# Patient Record
Sex: Male | Born: 1945 | Race: White | Hispanic: No | Marital: Married | Smoking: Never smoker
Health system: Southern US, Community
[De-identification: ages and names within clinical notes are randomized; demographics above are authoritative.]

## PROBLEM LIST (undated history)

## (undated) DIAGNOSIS — I350 Nonrheumatic aortic (valve) stenosis: Secondary | ICD-10-CM

## (undated) DIAGNOSIS — U071 COVID-19: Secondary | ICD-10-CM

## (undated) DIAGNOSIS — R011 Cardiac murmur, unspecified: Secondary | ICD-10-CM

## (undated) DIAGNOSIS — E119 Type 2 diabetes mellitus without complications: Secondary | ICD-10-CM

## (undated) DIAGNOSIS — I10 Essential (primary) hypertension: Secondary | ICD-10-CM

## (undated) HISTORY — DX: Essential (primary) hypertension: I10

## (undated) HISTORY — DX: Type 2 diabetes mellitus without complications: E11.9

---

## 2011-01-05 ENCOUNTER — Other Ambulatory Visit: Payer: Self-pay | Admitting: Gastroenterology

## 2011-01-13 ENCOUNTER — Ambulatory Visit
Admission: RE | Admit: 2011-01-13 | Discharge: 2011-01-13 | Disposition: A | Discharge status: Home / Self Care | Payer: BC Managed Care – PPO | Source: Ambulatory Visit | Attending: Gastroenterology | Admitting: Gastroenterology

## 2013-01-28 DIAGNOSIS — Z125 Encounter for screening for malignant neoplasm of prostate: Secondary | ICD-10-CM | POA: Diagnosis not present

## 2013-01-28 DIAGNOSIS — H519 Unspecified disorder of binocular movement: Secondary | ICD-10-CM | POA: Diagnosis not present

## 2013-01-28 DIAGNOSIS — Z0000 Encounter for general adult medical examination without abnormal findings: Secondary | ICD-10-CM | POA: Diagnosis not present

## 2013-01-28 DIAGNOSIS — I10 Essential (primary) hypertension: Secondary | ICD-10-CM | POA: Diagnosis not present

## 2013-01-28 DIAGNOSIS — E663 Overweight: Secondary | ICD-10-CM | POA: Diagnosis not present

## 2013-02-04 DIAGNOSIS — R7309 Other abnormal glucose: Secondary | ICD-10-CM | POA: Diagnosis not present

## 2013-04-08 DIAGNOSIS — IMO0002 Reserved for concepts with insufficient information to code with codable children: Secondary | ICD-10-CM | POA: Diagnosis not present

## 2013-04-08 DIAGNOSIS — H5210 Myopia, unspecified eye: Secondary | ICD-10-CM | POA: Diagnosis not present

## 2014-02-03 DIAGNOSIS — K521 Toxic gastroenteritis and colitis: Secondary | ICD-10-CM | POA: Diagnosis not present

## 2014-02-03 DIAGNOSIS — I10 Essential (primary) hypertension: Secondary | ICD-10-CM | POA: Diagnosis not present

## 2014-02-03 DIAGNOSIS — Z125 Encounter for screening for malignant neoplasm of prostate: Secondary | ICD-10-CM | POA: Diagnosis not present

## 2014-02-03 DIAGNOSIS — E663 Overweight: Secondary | ICD-10-CM | POA: Diagnosis not present

## 2014-02-03 DIAGNOSIS — Z0001 Encounter for general adult medical examination with abnormal findings: Secondary | ICD-10-CM | POA: Diagnosis not present

## 2014-02-03 DIAGNOSIS — Z1389 Encounter for screening for other disorder: Secondary | ICD-10-CM | POA: Diagnosis not present

## 2014-02-03 DIAGNOSIS — Z23 Encounter for immunization: Secondary | ICD-10-CM | POA: Diagnosis not present

## 2014-02-03 DIAGNOSIS — E119 Type 2 diabetes mellitus without complications: Secondary | ICD-10-CM | POA: Diagnosis not present

## 2014-02-24 ENCOUNTER — Encounter: Payer: Medicare Other | Attending: Internal Medicine

## 2014-02-24 VITALS — Ht 69.0 in | Wt 232.2 lb

## 2014-02-24 DIAGNOSIS — E119 Type 2 diabetes mellitus without complications: Secondary | ICD-10-CM | POA: Insufficient documentation

## 2014-02-24 DIAGNOSIS — Z713 Dietary counseling and surveillance: Secondary | ICD-10-CM | POA: Insufficient documentation

## 2014-02-26 DIAGNOSIS — E119 Type 2 diabetes mellitus without complications: Secondary | ICD-10-CM | POA: Diagnosis not present

## 2014-02-26 DIAGNOSIS — Z713 Dietary counseling and surveillance: Secondary | ICD-10-CM | POA: Diagnosis not present

## 2014-02-26 NOTE — Progress Notes (Signed)

## 2014-02-27 NOTE — Progress Notes (Signed)
Patient was seen on 02/24/14 for the first of a series of three diabetes self-management courses at the [facility].  Patient Education Plan per assessed needs and concerns is to attend four course education program for Diabetes Self Management Education.  The following learning objectives were met by the patient during this class:  Describe diabetes  State some common risk factors for diabetes  Defines the role of glucose and insulin  Identifies type of diabetes and pathophysiology  Describe the relationship between diabetes and cardiovascular risk  State the members of the Healthcare Team  States the rationale for glucose monitoring  State when to test glucose  State their individual Target Range  State the importance of logging glucose readings  Describe how to interpret glucose readings  Identifies A1C target  Explain the correlation between A1c and eAG values  State symptoms and treatment of high blood glucose  State symptoms and treatment of low blood glucose  Explain proper technique for glucose testing  Identifies proper sharps disposal  Handouts given during class include:  Living Well with Diabetes book  Carb Counting and Meal Planning book  Meal Plan Card  Carbohydrate guide  Meal planning worksheet  Low Sodium Flavoring Tips  The diabetes portion plate  F8M to eAG Conversion Chart  Diabetes Medications  Diabetes Recommended Care Schedule  Support Group  Diabetes Success Plan  Core Class Satisfaction Survey  Follow-Up Plan:  Attend core 2

## 2014-03-03 ENCOUNTER — Ambulatory Visit: Payer: BC Managed Care – PPO

## 2014-03-10 DIAGNOSIS — E119 Type 2 diabetes mellitus without complications: Secondary | ICD-10-CM

## 2014-03-10 DIAGNOSIS — Z713 Dietary counseling and surveillance: Secondary | ICD-10-CM | POA: Diagnosis not present

## 2014-03-13 NOTE — Progress Notes (Signed)
Patient was seen on 03/10/14 for the third of a series of three diabetes self-management courses at the [facility]. The following learning objectives were met by the patient during this class:  . State the amount of activity recommended for healthy living . Describe activities suitable for individual needs . Identify ways to regularly incorporate activity into daily life . Identify barriers to activity and ways to over come these barriers  Identify diabetes medications being personally used and their primary action for lowering glucose and possible side effects . Describe role of stress on blood glucose and develop strategies to address psychosocial issues . Identify diabetes complications and ways to prevent them  Explain how to manage diabetes during illness . Evaluate success in meeting personal goal . Establish 2-3 goals that they will plan to diligently work on until they return for the  75-monthfollow-up visit  Goals:   I will count my carb choices at most meals and snacks  Your patient has identified these potential barriers to change:  None states  Your patient has identified their diabetes self-care support plan as  Family Support Plan:  Attend Core 4 in 4 months

## 2014-04-30 DIAGNOSIS — I10 Essential (primary) hypertension: Secondary | ICD-10-CM | POA: Diagnosis not present

## 2014-04-30 DIAGNOSIS — E119 Type 2 diabetes mellitus without complications: Secondary | ICD-10-CM | POA: Diagnosis not present

## 2014-06-29 ENCOUNTER — Ambulatory Visit: Payer: BC Managed Care – PPO

## 2014-08-26 DIAGNOSIS — E119 Type 2 diabetes mellitus without complications: Secondary | ICD-10-CM | POA: Diagnosis not present

## 2014-08-26 DIAGNOSIS — E785 Hyperlipidemia, unspecified: Secondary | ICD-10-CM | POA: Diagnosis not present

## 2014-08-26 DIAGNOSIS — E663 Overweight: Secondary | ICD-10-CM | POA: Diagnosis not present

## 2014-08-26 DIAGNOSIS — I10 Essential (primary) hypertension: Secondary | ICD-10-CM | POA: Diagnosis not present

## 2014-08-26 DIAGNOSIS — Z6835 Body mass index (BMI) 35.0-35.9, adult: Secondary | ICD-10-CM | POA: Diagnosis not present

## 2014-11-17 DIAGNOSIS — H4423 Degenerative myopia, bilateral: Secondary | ICD-10-CM | POA: Diagnosis not present

## 2014-11-17 DIAGNOSIS — H5021 Vertical strabismus, right eye: Secondary | ICD-10-CM | POA: Diagnosis not present

## 2014-12-14 DIAGNOSIS — L723 Sebaceous cyst: Secondary | ICD-10-CM | POA: Diagnosis not present

## 2014-12-14 DIAGNOSIS — L821 Other seborrheic keratosis: Secondary | ICD-10-CM | POA: Diagnosis not present

## 2015-03-17 DIAGNOSIS — Z125 Encounter for screening for malignant neoplasm of prostate: Secondary | ICD-10-CM | POA: Diagnosis not present

## 2015-03-17 DIAGNOSIS — Z0000 Encounter for general adult medical examination without abnormal findings: Secondary | ICD-10-CM | POA: Diagnosis not present

## 2015-03-17 DIAGNOSIS — E663 Overweight: Secondary | ICD-10-CM | POA: Diagnosis not present

## 2015-03-17 DIAGNOSIS — Z1389 Encounter for screening for other disorder: Secondary | ICD-10-CM | POA: Diagnosis not present

## 2015-03-17 DIAGNOSIS — E119 Type 2 diabetes mellitus without complications: Secondary | ICD-10-CM | POA: Diagnosis not present

## 2015-03-17 DIAGNOSIS — E785 Hyperlipidemia, unspecified: Secondary | ICD-10-CM | POA: Diagnosis not present

## 2015-03-17 DIAGNOSIS — I10 Essential (primary) hypertension: Secondary | ICD-10-CM | POA: Diagnosis not present

## 2015-03-17 DIAGNOSIS — Z6835 Body mass index (BMI) 35.0-35.9, adult: Secondary | ICD-10-CM | POA: Diagnosis not present

## 2015-10-15 DIAGNOSIS — I10 Essential (primary) hypertension: Secondary | ICD-10-CM | POA: Diagnosis not present

## 2015-10-15 DIAGNOSIS — E7800 Pure hypercholesterolemia, unspecified: Secondary | ICD-10-CM | POA: Diagnosis not present

## 2015-10-15 DIAGNOSIS — Z7984 Long term (current) use of oral hypoglycemic drugs: Secondary | ICD-10-CM | POA: Diagnosis not present

## 2015-10-15 DIAGNOSIS — E119 Type 2 diabetes mellitus without complications: Secondary | ICD-10-CM | POA: Diagnosis not present

## 2015-10-15 DIAGNOSIS — E663 Overweight: Secondary | ICD-10-CM | POA: Diagnosis not present

## 2015-10-15 DIAGNOSIS — Z6834 Body mass index (BMI) 34.0-34.9, adult: Secondary | ICD-10-CM | POA: Diagnosis not present

## 2016-04-03 DIAGNOSIS — Z0000 Encounter for general adult medical examination without abnormal findings: Secondary | ICD-10-CM | POA: Diagnosis not present

## 2016-04-03 DIAGNOSIS — E663 Overweight: Secondary | ICD-10-CM | POA: Diagnosis not present

## 2016-04-03 DIAGNOSIS — Z1389 Encounter for screening for other disorder: Secondary | ICD-10-CM | POA: Diagnosis not present

## 2016-04-03 DIAGNOSIS — E7800 Pure hypercholesterolemia, unspecified: Secondary | ICD-10-CM | POA: Diagnosis not present

## 2016-04-03 DIAGNOSIS — I10 Essential (primary) hypertension: Secondary | ICD-10-CM | POA: Diagnosis not present

## 2016-04-03 DIAGNOSIS — Z6833 Body mass index (BMI) 33.0-33.9, adult: Secondary | ICD-10-CM | POA: Diagnosis not present

## 2016-04-03 DIAGNOSIS — Z125 Encounter for screening for malignant neoplasm of prostate: Secondary | ICD-10-CM | POA: Diagnosis not present

## 2016-04-03 DIAGNOSIS — E119 Type 2 diabetes mellitus without complications: Secondary | ICD-10-CM | POA: Diagnosis not present

## 2016-04-03 DIAGNOSIS — Z7984 Long term (current) use of oral hypoglycemic drugs: Secondary | ICD-10-CM | POA: Diagnosis not present

## 2016-08-14 DIAGNOSIS — R5383 Other fatigue: Secondary | ICD-10-CM | POA: Diagnosis not present

## 2016-08-14 DIAGNOSIS — F419 Anxiety disorder, unspecified: Secondary | ICD-10-CM | POA: Diagnosis not present

## 2016-10-02 DIAGNOSIS — I10 Essential (primary) hypertension: Secondary | ICD-10-CM | POA: Diagnosis not present

## 2016-10-02 DIAGNOSIS — E663 Overweight: Secondary | ICD-10-CM | POA: Diagnosis not present

## 2016-10-02 DIAGNOSIS — E119 Type 2 diabetes mellitus without complications: Secondary | ICD-10-CM | POA: Diagnosis not present

## 2016-10-02 DIAGNOSIS — Z6833 Body mass index (BMI) 33.0-33.9, adult: Secondary | ICD-10-CM | POA: Diagnosis not present

## 2016-10-02 DIAGNOSIS — F419 Anxiety disorder, unspecified: Secondary | ICD-10-CM | POA: Diagnosis not present

## 2016-10-02 DIAGNOSIS — E7800 Pure hypercholesterolemia, unspecified: Secondary | ICD-10-CM | POA: Diagnosis not present

## 2016-11-28 DIAGNOSIS — H5021 Vertical strabismus, right eye: Secondary | ICD-10-CM | POA: Diagnosis not present

## 2017-01-16 DIAGNOSIS — H5021 Vertical strabismus, right eye: Secondary | ICD-10-CM | POA: Diagnosis not present

## 2017-04-04 DIAGNOSIS — Z7984 Long term (current) use of oral hypoglycemic drugs: Secondary | ICD-10-CM | POA: Diagnosis not present

## 2017-04-04 DIAGNOSIS — Z23 Encounter for immunization: Secondary | ICD-10-CM | POA: Diagnosis not present

## 2017-04-04 DIAGNOSIS — F419 Anxiety disorder, unspecified: Secondary | ICD-10-CM | POA: Diagnosis not present

## 2017-04-04 DIAGNOSIS — E7800 Pure hypercholesterolemia, unspecified: Secondary | ICD-10-CM | POA: Diagnosis not present

## 2017-04-04 DIAGNOSIS — Z125 Encounter for screening for malignant neoplasm of prostate: Secondary | ICD-10-CM | POA: Diagnosis not present

## 2017-04-04 DIAGNOSIS — I10 Essential (primary) hypertension: Secondary | ICD-10-CM | POA: Diagnosis not present

## 2017-04-04 DIAGNOSIS — E119 Type 2 diabetes mellitus without complications: Secondary | ICD-10-CM | POA: Diagnosis not present

## 2017-04-04 DIAGNOSIS — Z1389 Encounter for screening for other disorder: Secondary | ICD-10-CM | POA: Diagnosis not present

## 2017-04-04 DIAGNOSIS — E663 Overweight: Secondary | ICD-10-CM | POA: Diagnosis not present

## 2017-04-04 DIAGNOSIS — Z0000 Encounter for general adult medical examination without abnormal findings: Secondary | ICD-10-CM | POA: Diagnosis not present

## 2017-10-03 DIAGNOSIS — E1169 Type 2 diabetes mellitus with other specified complication: Secondary | ICD-10-CM | POA: Diagnosis not present

## 2017-10-03 DIAGNOSIS — F419 Anxiety disorder, unspecified: Secondary | ICD-10-CM | POA: Diagnosis not present

## 2017-10-03 DIAGNOSIS — Z6832 Body mass index (BMI) 32.0-32.9, adult: Secondary | ICD-10-CM | POA: Diagnosis not present

## 2017-10-03 DIAGNOSIS — E663 Overweight: Secondary | ICD-10-CM | POA: Diagnosis not present

## 2017-10-03 DIAGNOSIS — Z7984 Long term (current) use of oral hypoglycemic drugs: Secondary | ICD-10-CM | POA: Diagnosis not present

## 2017-10-03 DIAGNOSIS — E7800 Pure hypercholesterolemia, unspecified: Secondary | ICD-10-CM | POA: Diagnosis not present

## 2017-10-03 DIAGNOSIS — I10 Essential (primary) hypertension: Secondary | ICD-10-CM | POA: Diagnosis not present

## 2018-01-29 DIAGNOSIS — Z800 Family history of malignant neoplasm of digestive organs: Secondary | ICD-10-CM | POA: Diagnosis not present

## 2018-01-29 DIAGNOSIS — K5730 Diverticulosis of large intestine without perforation or abscess without bleeding: Secondary | ICD-10-CM | POA: Diagnosis not present

## 2018-01-29 DIAGNOSIS — Z1211 Encounter for screening for malignant neoplasm of colon: Secondary | ICD-10-CM | POA: Diagnosis not present

## 2018-03-01 DIAGNOSIS — Z800 Family history of malignant neoplasm of digestive organs: Secondary | ICD-10-CM | POA: Diagnosis not present

## 2018-03-01 DIAGNOSIS — D123 Benign neoplasm of transverse colon: Secondary | ICD-10-CM | POA: Diagnosis not present

## 2018-03-01 DIAGNOSIS — Z1211 Encounter for screening for malignant neoplasm of colon: Secondary | ICD-10-CM | POA: Diagnosis not present

## 2018-03-01 DIAGNOSIS — K635 Polyp of colon: Secondary | ICD-10-CM | POA: Diagnosis not present

## 2018-03-01 DIAGNOSIS — K5730 Diverticulosis of large intestine without perforation or abscess without bleeding: Secondary | ICD-10-CM | POA: Diagnosis not present

## 2018-04-30 DIAGNOSIS — Z0000 Encounter for general adult medical examination without abnormal findings: Secondary | ICD-10-CM | POA: Diagnosis not present

## 2018-04-30 DIAGNOSIS — Z125 Encounter for screening for malignant neoplasm of prostate: Secondary | ICD-10-CM | POA: Diagnosis not present

## 2018-04-30 DIAGNOSIS — Z1389 Encounter for screening for other disorder: Secondary | ICD-10-CM | POA: Diagnosis not present

## 2018-04-30 DIAGNOSIS — E7800 Pure hypercholesterolemia, unspecified: Secondary | ICD-10-CM | POA: Diagnosis not present

## 2018-04-30 DIAGNOSIS — I10 Essential (primary) hypertension: Secondary | ICD-10-CM | POA: Diagnosis not present

## 2018-04-30 DIAGNOSIS — E1169 Type 2 diabetes mellitus with other specified complication: Secondary | ICD-10-CM | POA: Diagnosis not present

## 2018-04-30 DIAGNOSIS — E663 Overweight: Secondary | ICD-10-CM | POA: Diagnosis not present

## 2018-11-05 DIAGNOSIS — F419 Anxiety disorder, unspecified: Secondary | ICD-10-CM | POA: Diagnosis not present

## 2018-11-05 DIAGNOSIS — E1169 Type 2 diabetes mellitus with other specified complication: Secondary | ICD-10-CM | POA: Diagnosis not present

## 2018-11-05 DIAGNOSIS — E7800 Pure hypercholesterolemia, unspecified: Secondary | ICD-10-CM | POA: Diagnosis not present

## 2018-11-05 DIAGNOSIS — I10 Essential (primary) hypertension: Secondary | ICD-10-CM | POA: Diagnosis not present

## 2018-11-07 DIAGNOSIS — F419 Anxiety disorder, unspecified: Secondary | ICD-10-CM | POA: Diagnosis not present

## 2018-11-07 DIAGNOSIS — E1169 Type 2 diabetes mellitus with other specified complication: Secondary | ICD-10-CM | POA: Diagnosis not present

## 2018-11-07 DIAGNOSIS — E7800 Pure hypercholesterolemia, unspecified: Secondary | ICD-10-CM | POA: Diagnosis not present

## 2019-05-28 DIAGNOSIS — Z1389 Encounter for screening for other disorder: Secondary | ICD-10-CM | POA: Diagnosis not present

## 2019-05-28 DIAGNOSIS — Z0000 Encounter for general adult medical examination without abnormal findings: Secondary | ICD-10-CM | POA: Diagnosis not present

## 2019-05-28 DIAGNOSIS — E7800 Pure hypercholesterolemia, unspecified: Secondary | ICD-10-CM | POA: Diagnosis not present

## 2019-05-28 DIAGNOSIS — E1169 Type 2 diabetes mellitus with other specified complication: Secondary | ICD-10-CM | POA: Diagnosis not present

## 2019-05-28 DIAGNOSIS — E663 Overweight: Secondary | ICD-10-CM | POA: Diagnosis not present

## 2019-05-28 DIAGNOSIS — I358 Other nonrheumatic aortic valve disorders: Secondary | ICD-10-CM | POA: Diagnosis not present

## 2019-05-28 DIAGNOSIS — I10 Essential (primary) hypertension: Secondary | ICD-10-CM | POA: Diagnosis not present

## 2019-06-11 DIAGNOSIS — I358 Other nonrheumatic aortic valve disorders: Secondary | ICD-10-CM | POA: Diagnosis not present

## 2019-10-20 DIAGNOSIS — G4700 Insomnia, unspecified: Secondary | ICD-10-CM | POA: Diagnosis not present

## 2019-10-20 DIAGNOSIS — F419 Anxiety disorder, unspecified: Secondary | ICD-10-CM | POA: Diagnosis not present

## 2019-10-20 DIAGNOSIS — I350 Nonrheumatic aortic (valve) stenosis: Secondary | ICD-10-CM | POA: Diagnosis not present

## 2019-11-28 DIAGNOSIS — E1169 Type 2 diabetes mellitus with other specified complication: Secondary | ICD-10-CM | POA: Diagnosis not present

## 2019-11-28 DIAGNOSIS — E663 Overweight: Secondary | ICD-10-CM | POA: Diagnosis not present

## 2019-11-28 DIAGNOSIS — I350 Nonrheumatic aortic (valve) stenosis: Secondary | ICD-10-CM | POA: Diagnosis not present

## 2019-11-28 DIAGNOSIS — I10 Essential (primary) hypertension: Secondary | ICD-10-CM | POA: Diagnosis not present

## 2019-11-28 DIAGNOSIS — E7800 Pure hypercholesterolemia, unspecified: Secondary | ICD-10-CM | POA: Diagnosis not present

## 2019-11-28 DIAGNOSIS — Z7984 Long term (current) use of oral hypoglycemic drugs: Secondary | ICD-10-CM | POA: Diagnosis not present

## 2019-11-28 DIAGNOSIS — E871 Hypo-osmolality and hyponatremia: Secondary | ICD-10-CM | POA: Diagnosis not present

## 2019-11-28 DIAGNOSIS — F419 Anxiety disorder, unspecified: Secondary | ICD-10-CM | POA: Diagnosis not present

## 2019-12-02 DIAGNOSIS — E871 Hypo-osmolality and hyponatremia: Secondary | ICD-10-CM | POA: Diagnosis not present

## 2019-12-05 DIAGNOSIS — E785 Hyperlipidemia, unspecified: Secondary | ICD-10-CM | POA: Diagnosis not present

## 2019-12-05 DIAGNOSIS — E119 Type 2 diabetes mellitus without complications: Secondary | ICD-10-CM | POA: Diagnosis not present

## 2019-12-05 DIAGNOSIS — G4700 Insomnia, unspecified: Secondary | ICD-10-CM | POA: Diagnosis not present

## 2019-12-05 DIAGNOSIS — E1169 Type 2 diabetes mellitus with other specified complication: Secondary | ICD-10-CM | POA: Diagnosis not present

## 2019-12-05 DIAGNOSIS — I10 Essential (primary) hypertension: Secondary | ICD-10-CM | POA: Diagnosis not present

## 2019-12-05 DIAGNOSIS — E7800 Pure hypercholesterolemia, unspecified: Secondary | ICD-10-CM | POA: Diagnosis not present

## 2019-12-10 DIAGNOSIS — E871 Hypo-osmolality and hyponatremia: Secondary | ICD-10-CM | POA: Diagnosis not present

## 2020-02-18 DIAGNOSIS — U071 COVID-19: Secondary | ICD-10-CM | POA: Diagnosis not present

## 2020-02-18 DIAGNOSIS — R059 Cough, unspecified: Secondary | ICD-10-CM | POA: Diagnosis not present

## 2020-02-23 ENCOUNTER — Other Ambulatory Visit (HOSPITAL_COMMUNITY): Payer: Self-pay

## 2020-02-23 DIAGNOSIS — U071 COVID-19: Secondary | ICD-10-CM | POA: Diagnosis not present

## 2020-02-23 DIAGNOSIS — Z9189 Other specified personal risk factors, not elsewhere classified: Secondary | ICD-10-CM | POA: Diagnosis not present

## 2020-02-24 ENCOUNTER — Telehealth: Payer: Self-pay | Admitting: Unknown Physician Specialty

## 2020-02-24 ENCOUNTER — Other Ambulatory Visit: Payer: Self-pay | Admitting: Unknown Physician Specialty

## 2020-02-24 ENCOUNTER — Ambulatory Visit (HOSPITAL_COMMUNITY)
Admission: RE | Admit: 2020-02-24 | Discharge: 2020-02-24 | Disposition: A | Discharge status: Home / Self Care | Payer: Medicare Other | Source: Ambulatory Visit | Attending: Pulmonary Disease | Admitting: Pulmonary Disease

## 2020-02-24 ENCOUNTER — Telehealth (HOSPITAL_COMMUNITY): Payer: Self-pay | Admitting: Emergency Medicine

## 2020-02-24 DIAGNOSIS — U071 COVID-19: Secondary | ICD-10-CM | POA: Diagnosis not present

## 2020-02-24 DIAGNOSIS — Z23 Encounter for immunization: Secondary | ICD-10-CM | POA: Insufficient documentation

## 2020-02-24 MED ORDER — FAMOTIDINE IN NACL 20-0.9 MG/50ML-% IV SOLN: 20.0000 mg | Freq: Once | INTRAVENOUS | Status: DC | PRN

## 2020-02-24 MED ORDER — SODIUM CHLORIDE 0.9 % IV SOLN: INTRAVENOUS | Status: DC | PRN

## 2020-02-24 MED ORDER — EPINEPHRINE 0.3 MG/0.3ML IJ SOAJ: 0.3000 mg | Freq: Once | INTRAMUSCULAR | Status: DC | PRN

## 2020-02-24 MED ORDER — DIPHENHYDRAMINE HCL 50 MG/ML IJ SOLN: 50.0000 mg | Freq: Once | INTRAMUSCULAR | Status: DC | PRN

## 2020-02-24 MED ORDER — METHYLPREDNISOLONE SODIUM SUCC 125 MG IJ SOLR: 125.0000 mg | Freq: Once | INTRAMUSCULAR | Status: DC | PRN

## 2020-02-24 MED ORDER — ALBUTEROL SULFATE HFA 108 (90 BASE) MCG/ACT IN AERS: 2.0000 | INHALATION_SPRAY | Freq: Once | RESPIRATORY_TRACT | Status: DC | PRN

## 2020-02-24 MED ORDER — SOTROVIMAB 500 MG/8ML IV SOLN
500.0000 mg | Freq: Once | INTRAVENOUS | Status: AC
  Administered 2020-02-24: 500 mg via INTRAVENOUS

## 2020-02-24 NOTE — Progress Notes (Signed)
Diagnosis: COVID-19  Physician: Dr. Patrick Wright  Procedure: [facility] Med: Sotrovimab infusion - Provided patient with sotrovimab fact sheet for patients, parents, and caregivers prior to infusion.   Complications: No immediate complications noted  Discharge: Discharged home    

## 2020-02-24 NOTE — Telephone Encounter (Signed)
I connected by phone with Brian Robinson on 02/24/2020 at 4:21 PM to discuss the potential use of a new treatment for mild to moderate COVID-19 viral infection in non-hospitalized patients.  This patient is a 74 y.o. male that meets the FDA criteria for Emergency Use Authorization of COVID monoclonal antibody casirivimab/imdevimab, bamlanivimab/eteseviamb, or sotrovimab.  Has a (+) direct SARS-CoV-2 viral test result  Has mild or moderate COVID-19   Is NOT hospitalized due to COVID-19  Is within 10 days of symptom onset  Has at least one of the high risk factor(s) for progression to severe COVID-19 and/or hospitalization as defined in EUA.  Specific high risk criteria : Older age (>/= 75 yo) and Diabetes   I have spoken and communicated the following to the patient or parent/caregiver regarding COVID monoclonal antibody treatment:  1. FDA has authorized the emergency use for the treatment of mild to moderate COVID-19 in adults and pediatric patients with positive results of direct SARS-CoV-2 viral testing who are 28 years of age and older weighing at least 40 kg, and who are at high risk for progressing to severe COVID-19 and/or hospitalization.  2. The significant known and potential risks and benefits of COVID monoclonal antibody, and the extent to which such potential risks and benefits are unknown.  3. Information on available alternative treatments and the risks and benefits of those alternatives, including clinical trials.  4. Patients treated with COVID monoclonal antibody should continue to self-isolate and use infection control measures (e.g., wear mask, isolate, social distance, avoid sharing personal items, clean and disinfect "high touch" surfaces, and frequent handwashing) according to CDC guidelines.   5. The patient or parent/caregiver has the option to accept or refuse COVID monoclonal antibody treatment.  After reviewing this information with the patient, the patient has  agreed to receive one of the available covid 19 monoclonal antibodies and will be provided an appropriate fact sheet prior to infusion. Kathrine Haddock, NP 02/24/2020 4:21 PM

## 2020-02-24 NOTE — Progress Notes (Signed)
Patient reviewed Fact Sheet for Patients, Parents, and Caregivers for Emergency Use Authorization (EUA) of Sotrovimab for the Treatment of Coronavirus. Patient also reviewed and is agreeable to the estimated cost of treatment. Patient is agreeable to proceed.   

## 2020-02-24 NOTE — Discharge Instructions (Signed)
10 Things You Can Do to Manage Your COVID-19 Symptoms at Home If you have possible or confirmed COVID-19: 1. Stay home from work and school. And stay away from other public places. If you must go out, avoid using any kind of public transportation, ridesharing, or taxis. 2. Monitor your symptoms carefully. If your symptoms get worse, call your healthcare provider immediately. 3. Get rest and stay hydrated. 4. If you have a medical appointment, call the healthcare provider ahead of time and tell them that you have or may have COVID-19. 5. For medical emergencies, call 911 and notify the dispatch personnel that you have or may have COVID-19. 6. Cover your cough and sneezes with a tissue or use the inside of your elbow. 7. Wash your hands often with soap and water for at least 20 seconds or clean your hands with an alcohol-based hand sanitizer that contains at least 60% alcohol. 8. As much as possible, stay in a specific room and away from other people in your home. Also, you should use a separate bathroom, if available. If you need to be around other people in or outside of the home, wear a mask. 9. Avoid sharing personal items with other people in your household, like dishes, towels, and bedding. 10. Clean all surfaces that are touched often, like counters, tabletops, and doorknobs. Use household cleaning sprays or wipes according to the label instructions. cdc.gov/coronavirus 09/25/2018 This information is not intended to replace advice given to you by your health care provider. Make sure you discuss any questions you have with your health care provider. Document Revised: 02/27/2019 Document Reviewed: 02/27/2019 Elsevier Patient Education  2020 Elsevier Inc.  What types of side effects do monoclonal antibody drugs cause?  Common side effects  In general, the more common side effects caused by monoclonal antibody drugs include: . Allergic reactions, such as hives or itching . Flu-like signs and  symptoms, including chills, fatigue, fever, and muscle aches and pains . Nausea, vomiting . Diarrhea . Skin rashes . Low blood pressure   The CDC is recommending patients who receive monoclonal antibody treatments wait at least 90 days before being vaccinated.  Currently, there are no data on the safety and efficacy of mRNA COVID-19 vaccines in persons who received monoclonal antibodies or convalescent plasma as part of COVID-19 treatment. Based on the estimated half-life of such therapies as well as evidence suggesting that reinfection is uncommon in the 90 days after initial infection, vaccination should be deferred for at least 90 days, as a precautionary measure until additional information becomes available, to avoid interference of the antibody treatment with vaccine-induced immune responses.  If you have any questions or concerns after the infusion please call the Advanced Practice Provider on call at [phone]. This number is only intended for your use regarding questions or concerns about the infusion post-treatment side-effects.  Please do not provide this number to others for use.   If someone you know is interested in receiving treatment please have them call the COVID hotline at [phone].   

## 2020-02-24 NOTE — Telephone Encounter (Signed)
Pt spouse, Demba Nigh [phone], requested we screen pt for possible monoclonal antibody treatment.  Explained treatment. Sx started 11/22. Tested positive 11/24 at [facility] in [place]. Test results were emailed to [email or web address]. Sx include cough. Qualifying risk factors include HTN and DM2. Fully vaccinated with [place] 05/07/19. Pt interested in tx. Informed pt an APP will call back to possibly schedule an appointment. Call Rockne Menghini [phone].

## 2020-03-01 DIAGNOSIS — R0602 Shortness of breath: Secondary | ICD-10-CM | POA: Diagnosis not present

## 2020-03-01 DIAGNOSIS — U071 COVID-19: Secondary | ICD-10-CM | POA: Diagnosis not present

## 2020-03-22 DIAGNOSIS — H43813 Vitreous degeneration, bilateral: Secondary | ICD-10-CM | POA: Diagnosis not present

## 2020-03-22 DIAGNOSIS — D3131 Benign neoplasm of right choroid: Secondary | ICD-10-CM | POA: Diagnosis not present

## 2020-03-22 DIAGNOSIS — E119 Type 2 diabetes mellitus without complications: Secondary | ICD-10-CM | POA: Diagnosis not present

## 2020-03-22 DIAGNOSIS — H2513 Age-related nuclear cataract, bilateral: Secondary | ICD-10-CM | POA: Diagnosis not present

## 2020-04-27 DIAGNOSIS — H2513 Age-related nuclear cataract, bilateral: Secondary | ICD-10-CM | POA: Diagnosis not present

## 2020-04-27 DIAGNOSIS — H2511 Age-related nuclear cataract, right eye: Secondary | ICD-10-CM | POA: Diagnosis not present

## 2020-04-27 DIAGNOSIS — H25013 Cortical age-related cataract, bilateral: Secondary | ICD-10-CM | POA: Diagnosis not present

## 2020-04-27 DIAGNOSIS — D3131 Benign neoplasm of right choroid: Secondary | ICD-10-CM | POA: Diagnosis not present

## 2020-04-27 DIAGNOSIS — H40013 Open angle with borderline findings, low risk, bilateral: Secondary | ICD-10-CM | POA: Diagnosis not present

## 2020-04-27 DIAGNOSIS — E119 Type 2 diabetes mellitus without complications: Secondary | ICD-10-CM | POA: Diagnosis not present

## 2020-05-13 DIAGNOSIS — E7800 Pure hypercholesterolemia, unspecified: Secondary | ICD-10-CM | POA: Diagnosis not present

## 2020-05-13 DIAGNOSIS — E1169 Type 2 diabetes mellitus with other specified complication: Secondary | ICD-10-CM | POA: Diagnosis not present

## 2020-05-13 DIAGNOSIS — E119 Type 2 diabetes mellitus without complications: Secondary | ICD-10-CM | POA: Diagnosis not present

## 2020-05-13 DIAGNOSIS — I10 Essential (primary) hypertension: Secondary | ICD-10-CM | POA: Diagnosis not present

## 2020-05-13 DIAGNOSIS — G4700 Insomnia, unspecified: Secondary | ICD-10-CM | POA: Diagnosis not present

## 2020-05-13 DIAGNOSIS — E785 Hyperlipidemia, unspecified: Secondary | ICD-10-CM | POA: Diagnosis not present

## 2020-06-02 DIAGNOSIS — H25811 Combined forms of age-related cataract, right eye: Secondary | ICD-10-CM | POA: Diagnosis not present

## 2020-06-02 DIAGNOSIS — H2511 Age-related nuclear cataract, right eye: Secondary | ICD-10-CM | POA: Diagnosis not present

## 2020-06-07 DIAGNOSIS — H2512 Age-related nuclear cataract, left eye: Secondary | ICD-10-CM | POA: Diagnosis not present

## 2020-06-07 DIAGNOSIS — H25012 Cortical age-related cataract, left eye: Secondary | ICD-10-CM | POA: Diagnosis not present

## 2020-06-18 DIAGNOSIS — G4700 Insomnia, unspecified: Secondary | ICD-10-CM | POA: Diagnosis not present

## 2020-06-18 DIAGNOSIS — I10 Essential (primary) hypertension: Secondary | ICD-10-CM | POA: Diagnosis not present

## 2020-06-18 DIAGNOSIS — Z5181 Encounter for therapeutic drug level monitoring: Secondary | ICD-10-CM | POA: Diagnosis not present

## 2020-06-18 DIAGNOSIS — I350 Nonrheumatic aortic (valve) stenosis: Secondary | ICD-10-CM | POA: Diagnosis not present

## 2020-06-18 DIAGNOSIS — E7800 Pure hypercholesterolemia, unspecified: Secondary | ICD-10-CM | POA: Diagnosis not present

## 2020-06-18 DIAGNOSIS — E1169 Type 2 diabetes mellitus with other specified complication: Secondary | ICD-10-CM | POA: Diagnosis not present

## 2020-06-18 DIAGNOSIS — Z0000 Encounter for general adult medical examination without abnormal findings: Secondary | ICD-10-CM | POA: Diagnosis not present

## 2020-06-18 DIAGNOSIS — F419 Anxiety disorder, unspecified: Secondary | ICD-10-CM | POA: Diagnosis not present

## 2020-06-23 DIAGNOSIS — H25012 Cortical age-related cataract, left eye: Secondary | ICD-10-CM | POA: Diagnosis not present

## 2020-06-23 DIAGNOSIS — H2512 Age-related nuclear cataract, left eye: Secondary | ICD-10-CM | POA: Diagnosis not present

## 2020-06-23 DIAGNOSIS — H25812 Combined forms of age-related cataract, left eye: Secondary | ICD-10-CM | POA: Diagnosis not present

## 2020-07-01 DIAGNOSIS — E7800 Pure hypercholesterolemia, unspecified: Secondary | ICD-10-CM | POA: Diagnosis not present

## 2020-07-01 DIAGNOSIS — I10 Essential (primary) hypertension: Secondary | ICD-10-CM | POA: Diagnosis not present

## 2020-07-01 DIAGNOSIS — E119 Type 2 diabetes mellitus without complications: Secondary | ICD-10-CM | POA: Diagnosis not present

## 2020-07-01 DIAGNOSIS — G4700 Insomnia, unspecified: Secondary | ICD-10-CM | POA: Diagnosis not present

## 2020-07-01 DIAGNOSIS — E785 Hyperlipidemia, unspecified: Secondary | ICD-10-CM | POA: Diagnosis not present

## 2020-07-01 DIAGNOSIS — E1169 Type 2 diabetes mellitus with other specified complication: Secondary | ICD-10-CM | POA: Diagnosis not present

## 2020-07-07 DIAGNOSIS — D649 Anemia, unspecified: Secondary | ICD-10-CM | POA: Diagnosis not present

## 2020-09-16 ENCOUNTER — Emergency Department (HOSPITAL_COMMUNITY): Payer: Medicare Other

## 2020-09-16 ENCOUNTER — Inpatient Hospital Stay (HOSPITAL_COMMUNITY): Payer: Medicare Other

## 2020-09-16 ENCOUNTER — Inpatient Hospital Stay (HOSPITAL_COMMUNITY)
Admission: EM | Admit: 2020-09-16 | Discharge: 2020-09-28 | DRG: 233 | Disposition: A | Discharge status: Home Health | Payer: Medicare Other | Attending: Thoracic Surgery (Cardiothoracic Vascular Surgery) | Admitting: Thoracic Surgery (Cardiothoracic Vascular Surgery)

## 2020-09-16 DIAGNOSIS — K6389 Other specified diseases of intestine: Secondary | ICD-10-CM | POA: Diagnosis not present

## 2020-09-16 DIAGNOSIS — R413 Other amnesia: Secondary | ICD-10-CM | POA: Diagnosis present

## 2020-09-16 DIAGNOSIS — I472 Ventricular tachycardia: Secondary | ICD-10-CM | POA: Diagnosis present

## 2020-09-16 DIAGNOSIS — J9601 Acute respiratory failure with hypoxia: Secondary | ICD-10-CM | POA: Diagnosis present

## 2020-09-16 DIAGNOSIS — S2222XA Fracture of body of sternum, initial encounter for closed fracture: Secondary | ICD-10-CM | POA: Diagnosis present

## 2020-09-16 DIAGNOSIS — I25110 Atherosclerotic heart disease of native coronary artery with unstable angina pectoris: Secondary | ICD-10-CM | POA: Diagnosis present

## 2020-09-16 DIAGNOSIS — F32A Depression, unspecified: Secondary | ICD-10-CM | POA: Diagnosis present

## 2020-09-16 DIAGNOSIS — I462 Cardiac arrest due to underlying cardiac condition: Secondary | ICD-10-CM | POA: Diagnosis not present

## 2020-09-16 DIAGNOSIS — I499 Cardiac arrhythmia, unspecified: Secondary | ICD-10-CM | POA: Diagnosis not present

## 2020-09-16 DIAGNOSIS — I4891 Unspecified atrial fibrillation: Secondary | ICD-10-CM | POA: Diagnosis not present

## 2020-09-16 DIAGNOSIS — Z01810 Encounter for preprocedural cardiovascular examination: Secondary | ICD-10-CM | POA: Diagnosis not present

## 2020-09-16 DIAGNOSIS — R600 Localized edema: Secondary | ICD-10-CM | POA: Diagnosis not present

## 2020-09-16 DIAGNOSIS — E876 Hypokalemia: Secondary | ICD-10-CM | POA: Diagnosis present

## 2020-09-16 DIAGNOSIS — E785 Hyperlipidemia, unspecified: Secondary | ICD-10-CM | POA: Diagnosis present

## 2020-09-16 DIAGNOSIS — I2510 Atherosclerotic heart disease of native coronary artery without angina pectoris: Secondary | ICD-10-CM | POA: Diagnosis not present

## 2020-09-16 DIAGNOSIS — J9811 Atelectasis: Secondary | ICD-10-CM | POA: Diagnosis not present

## 2020-09-16 DIAGNOSIS — K7200 Acute and subacute hepatic failure without coma: Secondary | ICD-10-CM | POA: Diagnosis present

## 2020-09-16 DIAGNOSIS — Z20822 Contact with and (suspected) exposure to covid-19: Secondary | ICD-10-CM | POA: Diagnosis present

## 2020-09-16 DIAGNOSIS — D71 Functional disorders of polymorphonuclear neutrophils: Secondary | ICD-10-CM | POA: Diagnosis present

## 2020-09-16 DIAGNOSIS — Y848 Other medical procedures as the cause of abnormal reaction of the patient, or of later complication, without mention of misadventure at the time of the procedure: Secondary | ICD-10-CM | POA: Diagnosis present

## 2020-09-16 DIAGNOSIS — I471 Supraventricular tachycardia: Secondary | ICD-10-CM | POA: Diagnosis not present

## 2020-09-16 DIAGNOSIS — R509 Fever, unspecified: Secondary | ICD-10-CM

## 2020-09-16 DIAGNOSIS — D649 Anemia, unspecified: Secondary | ICD-10-CM | POA: Diagnosis not present

## 2020-09-16 DIAGNOSIS — E611 Iron deficiency: Secondary | ICD-10-CM | POA: Diagnosis present

## 2020-09-16 DIAGNOSIS — I493 Ventricular premature depolarization: Secondary | ICD-10-CM | POA: Diagnosis present

## 2020-09-16 DIAGNOSIS — I10 Essential (primary) hypertension: Secondary | ICD-10-CM | POA: Diagnosis not present

## 2020-09-16 DIAGNOSIS — E8770 Fluid overload, unspecified: Secondary | ICD-10-CM | POA: Diagnosis present

## 2020-09-16 DIAGNOSIS — Z79899 Other long term (current) drug therapy: Secondary | ICD-10-CM

## 2020-09-16 DIAGNOSIS — R079 Chest pain, unspecified: Secondary | ICD-10-CM | POA: Diagnosis not present

## 2020-09-16 DIAGNOSIS — Z7984 Long term (current) use of oral hypoglycemic drugs: Secondary | ICD-10-CM

## 2020-09-16 DIAGNOSIS — R0602 Shortness of breath: Secondary | ICD-10-CM | POA: Diagnosis not present

## 2020-09-16 DIAGNOSIS — Z8616 Personal history of COVID-19: Secondary | ICD-10-CM

## 2020-09-16 DIAGNOSIS — I517 Cardiomegaly: Secondary | ICD-10-CM | POA: Diagnosis not present

## 2020-09-16 DIAGNOSIS — Z951 Presence of aortocoronary bypass graft: Secondary | ICD-10-CM

## 2020-09-16 DIAGNOSIS — J8410 Pulmonary fibrosis, unspecified: Secondary | ICD-10-CM | POA: Diagnosis not present

## 2020-09-16 DIAGNOSIS — E119 Type 2 diabetes mellitus without complications: Secondary | ICD-10-CM | POA: Diagnosis not present

## 2020-09-16 DIAGNOSIS — I214 Non-ST elevation (NSTEMI) myocardial infarction: Secondary | ICD-10-CM | POA: Diagnosis not present

## 2020-09-16 DIAGNOSIS — R7989 Other specified abnormal findings of blood chemistry: Secondary | ICD-10-CM | POA: Diagnosis present

## 2020-09-16 DIAGNOSIS — R4020 Unspecified coma: Secondary | ICD-10-CM | POA: Diagnosis not present

## 2020-09-16 DIAGNOSIS — N179 Acute kidney failure, unspecified: Secondary | ICD-10-CM | POA: Diagnosis present

## 2020-09-16 DIAGNOSIS — S2243XA Multiple fractures of ribs, bilateral, initial encounter for closed fracture: Secondary | ICD-10-CM | POA: Diagnosis not present

## 2020-09-16 DIAGNOSIS — G9389 Other specified disorders of brain: Secondary | ICD-10-CM | POA: Diagnosis present

## 2020-09-16 DIAGNOSIS — I469 Cardiac arrest, cause unspecified: Secondary | ICD-10-CM | POA: Diagnosis not present

## 2020-09-16 DIAGNOSIS — F419 Anxiety disorder, unspecified: Secondary | ICD-10-CM | POA: Diagnosis present

## 2020-09-16 DIAGNOSIS — G9340 Encephalopathy, unspecified: Secondary | ICD-10-CM | POA: Diagnosis not present

## 2020-09-16 DIAGNOSIS — G931 Anoxic brain damage, not elsewhere classified: Secondary | ICD-10-CM | POA: Diagnosis not present

## 2020-09-16 DIAGNOSIS — R404 Transient alteration of awareness: Secondary | ICD-10-CM | POA: Diagnosis not present

## 2020-09-16 DIAGNOSIS — R9431 Abnormal electrocardiogram [ECG] [EKG]: Secondary | ICD-10-CM | POA: Diagnosis not present

## 2020-09-16 DIAGNOSIS — R61 Generalized hyperhidrosis: Secondary | ICD-10-CM | POA: Diagnosis not present

## 2020-09-16 DIAGNOSIS — I4901 Ventricular fibrillation: Secondary | ICD-10-CM | POA: Diagnosis not present

## 2020-09-16 DIAGNOSIS — E871 Hypo-osmolality and hyponatremia: Secondary | ICD-10-CM | POA: Diagnosis not present

## 2020-09-16 DIAGNOSIS — R609 Edema, unspecified: Secondary | ICD-10-CM | POA: Diagnosis not present

## 2020-09-16 DIAGNOSIS — N2889 Other specified disorders of kidney and ureter: Secondary | ICD-10-CM | POA: Diagnosis not present

## 2020-09-16 DIAGNOSIS — I700 Atherosclerosis of aorta: Secondary | ICD-10-CM | POA: Diagnosis present

## 2020-09-16 DIAGNOSIS — R001 Bradycardia, unspecified: Secondary | ICD-10-CM | POA: Diagnosis not present

## 2020-09-16 DIAGNOSIS — I350 Nonrheumatic aortic (valve) stenosis: Secondary | ICD-10-CM | POA: Diagnosis not present

## 2020-09-16 DIAGNOSIS — R4182 Altered mental status, unspecified: Secondary | ICD-10-CM | POA: Diagnosis not present

## 2020-09-16 DIAGNOSIS — M7989 Other specified soft tissue disorders: Secondary | ICD-10-CM | POA: Diagnosis not present

## 2020-09-16 DIAGNOSIS — J811 Chronic pulmonary edema: Secondary | ICD-10-CM | POA: Diagnosis not present

## 2020-09-16 DIAGNOSIS — Z781 Physical restraint status: Secondary | ICD-10-CM

## 2020-09-16 DIAGNOSIS — R748 Abnormal levels of other serum enzymes: Secondary | ICD-10-CM | POA: Diagnosis not present

## 2020-09-16 DIAGNOSIS — D62 Acute posthemorrhagic anemia: Secondary | ICD-10-CM | POA: Diagnosis not present

## 2020-09-16 DIAGNOSIS — I340 Nonrheumatic mitral (valve) insufficiency: Secondary | ICD-10-CM | POA: Diagnosis not present

## 2020-09-16 DIAGNOSIS — D7389 Other diseases of spleen: Secondary | ICD-10-CM | POA: Diagnosis not present

## 2020-09-16 DIAGNOSIS — Z7982 Long term (current) use of aspirin: Secondary | ICD-10-CM

## 2020-09-16 HISTORY — DX: Cardiac murmur, unspecified: R01.1

## 2020-09-16 HISTORY — DX: COVID-19: U07.1

## 2020-09-16 HISTORY — DX: Nonrheumatic aortic (valve) stenosis: I35.0

## 2020-09-16 LAB — CBC
HCT: 36 % — ABNORMAL LOW (ref 39.0–52.0)
Hemoglobin: 12.4 g/dL — ABNORMAL LOW (ref 13.0–17.0)
MCH: 29.7 pg (ref 26.0–34.0)
MCHC: 34.4 g/dL (ref 30.0–36.0)
MCV: 86.1 fL (ref 80.0–100.0)
Platelets: 174 10*3/uL (ref 150–400)
RBC: 4.18 MIL/uL — ABNORMAL LOW (ref 4.22–5.81)
RDW: 13.2 % (ref 11.5–15.5)
WBC: 17.7 10*3/uL — ABNORMAL HIGH (ref 4.0–10.5)
nRBC: 0 % (ref 0.0–0.2)

## 2020-09-16 LAB — URINALYSIS, ROUTINE W REFLEX MICROSCOPIC
Bacteria, UA: NONE SEEN
Bilirubin Urine: NEGATIVE
Glucose, UA: NEGATIVE mg/dL
Hgb urine dipstick: NEGATIVE
Ketones, ur: NEGATIVE mg/dL
Leukocytes,Ua: NEGATIVE
Nitrite: NEGATIVE
Protein, ur: 100 mg/dL — AB
Specific Gravity, Urine: 1.015 (ref 1.005–1.030)
pH: 5 (ref 5.0–8.0)

## 2020-09-16 LAB — COMPREHENSIVE METABOLIC PANEL
ALT: 99 U/L — ABNORMAL HIGH (ref 0–44)
AST: 122 U/L — ABNORMAL HIGH (ref 15–41)
Albumin: 4 g/dL (ref 3.5–5.0)
Alkaline Phosphatase: 76 U/L (ref 38–126)
Anion gap: 7 (ref 5–15)
BUN: 21 mg/dL (ref 8–23)
CO2: 30 mmol/L (ref 22–32)
Calcium: 8.6 mg/dL — ABNORMAL LOW (ref 8.9–10.3)
Chloride: 99 mmol/L (ref 98–111)
Creatinine, Ser: 1.35 mg/dL — ABNORMAL HIGH (ref 0.61–1.24)
GFR, Estimated: 54 mL/min — ABNORMAL LOW (ref >60)
Glucose, Bld: 122 mg/dL — ABNORMAL HIGH (ref 70–99)
Potassium: 3.6 mmol/L (ref 3.5–5.1)
Sodium: 136 mmol/L (ref 135–145)
Total Bilirubin: 0.9 mg/dL (ref 0.3–1.2)
Total Protein: 7.5 g/dL (ref 6.5–8.1)

## 2020-09-16 LAB — CBC WITH DIFFERENTIAL/PLATELET
Abs Immature Granulocytes: 0.2 10*3/uL — ABNORMAL HIGH (ref 0.00–0.07)
Basophils Absolute: 0.1 10*3/uL (ref 0.0–0.1)
Basophils Relative: 1 %
Eosinophils Absolute: 0.2 10*3/uL (ref 0.0–0.5)
Eosinophils Relative: 2 %
HCT: 38.9 % — ABNORMAL LOW (ref 39.0–52.0)
Hemoglobin: 13.3 g/dL (ref 13.0–17.0)
Immature Granulocytes: 2 %
Lymphocytes Relative: 32 %
Lymphs Abs: 4.2 10*3/uL — ABNORMAL HIGH (ref 0.7–4.0)
MCH: 29.4 pg (ref 26.0–34.0)
MCHC: 34.2 g/dL (ref 30.0–36.0)
MCV: 86.1 fL (ref 80.0–100.0)
Monocytes Absolute: 1.1 10*3/uL — ABNORMAL HIGH (ref 0.1–1.0)
Monocytes Relative: 8 %
Neutro Abs: 7.2 10*3/uL (ref 1.7–7.7)
Neutrophils Relative %: 55 %
Platelets: 211 10*3/uL (ref 150–400)
RBC: 4.52 MIL/uL (ref 4.22–5.81)
RDW: 13.2 % (ref 11.5–15.5)
WBC: 12.9 10*3/uL — ABNORMAL HIGH (ref 4.0–10.5)
nRBC: 0 % (ref 0.0–0.2)

## 2020-09-16 LAB — GLUCOSE, CAPILLARY
Glucose-Capillary: 121 mg/dL — ABNORMAL HIGH (ref 70–99)
Glucose-Capillary: 141 mg/dL — ABNORMAL HIGH (ref 70–99)
Glucose-Capillary: 152 mg/dL — ABNORMAL HIGH (ref 70–99)

## 2020-09-16 LAB — I-STAT ARTERIAL BLOOD GAS, ED
Acid-Base Excess: 6 mmol/L — ABNORMAL HIGH (ref 0.0–2.0)
Bicarbonate: 30.9 mmol/L — ABNORMAL HIGH (ref 20.0–28.0)
Calcium, Ion: 1.12 mmol/L — ABNORMAL LOW (ref 1.15–1.40)
HCT: 30 % — ABNORMAL LOW (ref 39.0–52.0)
Hemoglobin: 10.2 g/dL — ABNORMAL LOW (ref 13.0–17.0)
O2 Saturation: 100 %
Patient temperature: 96.3
Potassium: 2.9 mmol/L — ABNORMAL LOW (ref 3.5–5.1)
Sodium: 135 mmol/L (ref 135–145)
TCO2: 32 mmol/L (ref 22–32)
pCO2 arterial: 41.8 mmHg (ref 32.0–48.0)
pH, Arterial: 7.472 — ABNORMAL HIGH (ref 7.350–7.450)
pO2, Arterial: 262 mmHg — ABNORMAL HIGH (ref 83.0–108.0)

## 2020-09-16 LAB — I-STAT CHEM 8, ED
BUN: 23 mg/dL (ref 8–23)
Calcium, Ion: 1.05 mmol/L — ABNORMAL LOW (ref 1.15–1.40)
Chloride: 97 mmol/L — ABNORMAL LOW (ref 98–111)
Creatinine, Ser: 1.4 mg/dL — ABNORMAL HIGH (ref 0.61–1.24)
Glucose, Bld: 121 mg/dL — ABNORMAL HIGH (ref 70–99)
HCT: 39 % (ref 39.0–52.0)
Hemoglobin: 13.3 g/dL (ref 13.0–17.0)
Potassium: 3.5 mmol/L (ref 3.5–5.1)
Sodium: 138 mmol/L (ref 135–145)
TCO2: 26 mmol/L (ref 22–32)

## 2020-09-16 LAB — I-STAT VENOUS BLOOD GAS, ED
Acid-Base Excess: 3 mmol/L — ABNORMAL HIGH (ref 0.0–2.0)
Bicarbonate: 28.6 mmol/L — ABNORMAL HIGH (ref 20.0–28.0)
Calcium, Ion: 1.07 mmol/L — ABNORMAL LOW (ref 1.15–1.40)
HCT: 39 % (ref 39.0–52.0)
Hemoglobin: 13.3 g/dL (ref 13.0–17.0)
O2 Saturation: 95 %
Potassium: 3.7 mmol/L (ref 3.5–5.1)
Sodium: 138 mmol/L (ref 135–145)
TCO2: 30 mmol/L (ref 22–32)
pCO2, Ven: 46.6 mmHg (ref 44.0–60.0)
pH, Ven: 7.396 (ref 7.250–7.430)
pO2, Ven: 80 mmHg — ABNORMAL HIGH (ref 32.0–45.0)

## 2020-09-16 LAB — RESP PANEL BY RT-PCR (FLU A&B, COVID) ARPGX2
Influenza A by PCR: NEGATIVE
Influenza B by PCR: NEGATIVE
SARS Coronavirus 2 by RT PCR: NEGATIVE

## 2020-09-16 LAB — PROTIME-INR
INR: 1 (ref 0.8–1.2)
Prothrombin Time: 13.5 seconds (ref 11.4–15.2)

## 2020-09-16 LAB — LIPASE, BLOOD: Lipase: 54 U/L — ABNORMAL HIGH (ref 11–51)

## 2020-09-16 LAB — TROPONIN I (HIGH SENSITIVITY)
Troponin I (High Sensitivity): 155 ng/L (ref <18)
Troponin I (High Sensitivity): 1177 ng/L (ref <18)

## 2020-09-16 LAB — CBG MONITORING, ED: Glucose-Capillary: 104 mg/dL — ABNORMAL HIGH (ref 70–99)

## 2020-09-16 MED ORDER — PANTOPRAZOLE SODIUM 40 MG IV SOLR
40.0000 mg | Freq: Every day | INTRAVENOUS | Status: DC
  Administered 2020-09-16: 40 mg via INTRAVENOUS
  Filled 2020-09-16: qty 40

## 2020-09-16 MED ORDER — SODIUM CHLORIDE 0.9 % IV SOLN
INTRAVENOUS | Status: AC | PRN
  Administered 2020-09-16: 1000 mL via INTRAVENOUS

## 2020-09-16 MED ORDER — PROPOFOL 1000 MG/100ML IV EMUL
5.0000 ug/kg/min | INTRAVENOUS | Status: DC
Start: 2020-09-16 — End: 2020-09-16

## 2020-09-16 MED ORDER — IOHEXOL 350 MG/ML SOLN
75.0000 mL | Freq: Once | INTRAVENOUS | Status: AC | PRN
  Administered 2020-09-16: 75 mL via INTRAVENOUS

## 2020-09-16 MED ORDER — HEPARIN SODIUM (PORCINE) 5000 UNIT/ML IJ SOLN
5000.0000 [IU] | Freq: Three times a day (TID) | INTRAMUSCULAR | Status: DC
  Administered 2020-09-16 – 2020-09-17 (×2): 5000 [IU] via SUBCUTANEOUS
  Filled 2020-09-16 (×2): qty 1

## 2020-09-16 MED ORDER — PROPOFOL 1000 MG/100ML IV EMUL
INTRAVENOUS | Status: AC
  Administered 2020-09-16: 10 ug
  Filled 2020-09-16: qty 100

## 2020-09-16 MED ORDER — ATROPINE SULFATE 1 MG/10ML IJ SOSY
PREFILLED_SYRINGE | INTRAMUSCULAR | Status: AC
  Filled 2020-09-16: qty 10

## 2020-09-16 MED ORDER — ETOMIDATE 2 MG/ML IV SOLN
INTRAVENOUS | Status: AC | PRN
  Administered 2020-09-16: 20 mg via INTRAVENOUS

## 2020-09-16 MED ORDER — ROCURONIUM BROMIDE 50 MG/5ML IV SOLN
INTRAVENOUS | Status: AC | PRN
  Administered 2020-09-16: 100 mg via INTRAVENOUS

## 2020-09-16 MED ORDER — CHLORHEXIDINE GLUCONATE 0.12% ORAL RINSE (MEDLINE KIT)
15.0000 mL | Freq: Two times a day (BID) | OROMUCOSAL | Status: DC
  Administered 2020-09-16 – 2020-09-17 (×3): 15 mL via OROMUCOSAL

## 2020-09-16 MED ORDER — FENTANYL BOLUS VIA INFUSION
30.0000 ug | INTRAVENOUS | Status: DC | PRN
  Administered 2020-09-16 – 2020-09-17 (×2): 30 ug via INTRAVENOUS
  Administered 2020-09-17: 25 ug via INTRAVENOUS
  Administered 2020-09-17: 30 ug via INTRAVENOUS
  Filled 2020-09-16: qty 30

## 2020-09-16 MED ORDER — LABETALOL HCL 5 MG/ML IV SOLN: 10.0000 mg | INTRAVENOUS | Status: DC | PRN

## 2020-09-16 MED ORDER — DOCUSATE SODIUM 100 MG PO CAPS: 100.0000 mg | ORAL_CAPSULE | Freq: Two times a day (BID) | ORAL | Status: DC | PRN

## 2020-09-16 MED ORDER — FENTANYL 2500MCG IN NS 250ML (10MCG/ML) PREMIX INFUSION
0.0000 ug/h | INTRAVENOUS | Status: DC
  Administered 2020-09-16: 25 ug/h via INTRAVENOUS
  Filled 2020-09-16: qty 250

## 2020-09-16 MED ORDER — ORAL CARE MOUTH RINSE
15.0000 mL | OROMUCOSAL | Status: DC
  Administered 2020-09-17 (×4): 15 mL via OROMUCOSAL

## 2020-09-16 MED ORDER — LABETALOL HCL 5 MG/ML IV SOLN
10.0000 mg | Freq: Once | INTRAVENOUS | Status: AC
  Administered 2020-09-16: 10 mg via INTRAVENOUS
  Filled 2020-09-16: qty 4

## 2020-09-16 MED ORDER — POLYETHYLENE GLYCOL 3350 17 G PO PACK: 17.0000 g | PACK | Freq: Every day | ORAL | Status: DC | PRN

## 2020-09-16 MED ORDER — CHLORHEXIDINE GLUCONATE CLOTH 2 % EX PADS
6.0000 | MEDICATED_PAD | Freq: Every day | CUTANEOUS | Status: DC
  Administered 2020-09-16 – 2020-09-21 (×3): 6 via TOPICAL

## 2020-09-16 NOTE — Procedures (Signed)
History: 75 year old male  Sedation: Fentanyl  Technique: This is a 21 channel routine scalp EEG performed at the bedside with bipolar and monopolar montages arranged in accordance to the international 10/20 system of electrode placement. One channel was dedicated to EKG recording.    Background: The background consists of low voltage irregular delta range activity.  There were no clear sleep structures seen, nor was there any posterior dominant rhythm.  There was no epileptiform discharges.  Photic stimulation: Physiologic driving is not performed  EEG Abnormalities: 1) generalized irregular slow activity 2) absent posterior dominant rhythm  Clinical Interpretation: This EEG is consistent with a generalized nonspecific cerebral dysfunction (encephalopathy).. There was no seizure or seizure predisposition recorded on this study. Please note that lack of epileptiform activity on EEG does not preclude the possibility of epilepsy.   Roland Rack, MD [facility] [phone]  If 7pm- 7am, please page neurology on call as listed in [place].
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
## 2020-09-16 NOTE — ED Notes (Signed)
Patient transported to CT scan . 

## 2020-09-16 NOTE — ED Notes (Signed)
Per Dr. Ronnald Nian, hold off on cold cool at this moment.

## 2020-09-16 NOTE — Progress Notes (Signed)
eLink Physician-Brief Progress Note Patient Name: Brian Robinson DOB: June 19, 1945 MRN: [id number]   Date of Service  09/16/2020  HPI/Events of Note  Nursing reports bradycardia with increased Fentanyl IV infusion. HR now 77 with NSR. Agitation - Nursing request for bilateral soft wrist restraints.   eICU Interventions  Plan: Titrate Fentanyl IV infusion to least possible dose.  Bilateral soft wrist restraints X 9 hours.     Intervention Category Major Interventions: Delirium, psychosis, severe agitation - evaluation and management;Arrhythmia - evaluation and management  Konrad Hoak [place] 09/16/2020, 11:54 PM

## 2020-09-16 NOTE — Progress Notes (Signed)
Patient transported to 2M14 without any complications.

## 2020-09-16 NOTE — ED Notes (Signed)
Notified Dr. Ronnald Nian of BP 197/105. No orders at this time.
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
## 2020-09-16 NOTE — Progress Notes (Signed)
eLink Physician-Brief Progress Note Patient Name: Brian Robinson DOB: 02/25/1946 MRN: [id number]   Date of Service  09/16/2020  HPI/Events of Note  Bradycardia - Patient dropped HR to 30 on Propofol IV infusion at 50 mcg/kg/min.  eICU Interventions  Plan: Fentanyl IV infusion. Titrate to RASS = 0 to -1. D/C Propofol IV infusion.      Intervention Category Major Interventions: Arrhythmia - evaluation and management  Levern Pitter Cornelia Copa 09/16/2020, 9:54 PM

## 2020-09-16 NOTE — Progress Notes (Signed)
STAT EEG complete - results pending. ? ?

## 2020-09-16 NOTE — H&P (Addendum)
NAME:  Brian Robinson MRN:  [id number] DOB:  1945-06-09 LOS: 0 ADMISSION DATE:  09/16/2020 CONSULTATION DATE:  09/16/2020 REFERRING MD:  Ronnald Nian - EDP CHIEF COMPLAINT:  Cardiac arrest   History of Present Illness:  75 year old man with PMHx significant for HTN, T2DM and ?palpitations who presented to [facility] ED via EMS after witness cardiac arrest at home.  Patient reportedly was in his usual state of health, but was under some additional stress due to a bill he received in the mail on day on admission. Shortly after this, patient was standing next to wife and collapsed. Patient's wife contacted EMS. On Fire Department arrival, patient was pulseless and CPR was started within 5-10 minutes of arrest. Patient had a shockable rhythm and was shocked x 1 with ROSC; had pulses on EMS arrival but remained obtunded. Intubated on arrival to ED.  On presentation to [facility] ED, patient was hypothermic ~29F, hypertensive to 204/103 with normal HR and RR. O2 sat was 100% on ventilator. Labs were notable for WBC 12.9, H&H 13.3/38.9, Plt 211. Na 136, K 3.6, CO2 30, CUN 21, Cr 1.35. AST/ALT 122 and , lipase 54. Initial troponin 155. CT Head NAICA, demonstrating only chronic atrophic/ischemic changes. CT C-spine negative for acute injury.   PCCM consulted for admission.  Pertinent Medical History:   Past Medical History:  Diagnosis Date   Diabetes mellitus without complication ([place])    Hypertension    Significant Hospital Events: Including procedures, antibiotic start and stop dates in addition to other pertinent events   6/23 - BIB EMS after witnessed cardiac arrest, pulseless. CPR administered by Fire with shockable rhythm with Fire Dept on scene, shocked x 1. Pulses present on EMS arrival. Obtunded. Intubated in ED. Admitted to ICU.  Interim History / Subjective:  Admitted  Objective:  Blood pressure (!) 176/145, pulse 61, temperature (!) 95.6 F (35.3 C), resp. rate 18, height 5\' 6"  (1.676 m), SpO2 100 %.     Vent Mode: PRVC FiO2 (%):  [100 %] 100 % Set Rate:  [16 bmp-18 bmp] 18 bmp Vt Set:  [510 mL-550 mL] 510 mL PEEP:  [5 cmH20] 5 cmH20 Plateau Pressure:  [17 cmH20] 17 cmH20  No intake or output data in the 24 hours ending 09/16/20 2011 There were no vitals filed for this visit.  Physical Examination: General: Acutely ill-appearing elderly man in NAD. HEENT: [place]/AT, anicteric sclera, pupils equal, round 54mm and sluggishly reactive to light, moist mucous membranes. ETT in place. Clear secretions noted. Neuro: Sedated. Some nonpurposeful movement of BUE (shivering?) Does not respond to verbal, tactile or noxious stimuli. Not following commands. +Cough and +Gag CV: RRR, 9-7/0 systolic murmur best heard at upper SB. PULM: Breathing even and unlabored on vent (PEEP 5, FiO2 100%). Lung fields CTAB. GI: Soft, nontender, nondistended. Hypoactive bowel sounds. Extremities: No LE edema noted. Skin: Warm/dry, no rashes.  Labs/imaging that I have personally reviewed: (right click and "Reselect all SmartList Selections" daily)  WBC 12.9, H&H 13.3/38.9, Plt 211  Na 136, K 3.6, CO2 30, CUN 21, Cr 1.35 AST/ALT 122 and , lipase 54  Initial troponin 155  CXR 6/23 IMPRESSION: 1. Endotracheal tube above the carina. 2. Mild cardiomegaly with mild vascular congestion. No focal consolidation.  CT Head 6/23 IMPRESSION: CT of the head: No acute intracranial abnormality noted. Chronic atrophic and ischemic changes are seen.   CT C-Spine 6/23 IMPRESSION: CT of the cervical spine: Multilevel degenerative change without acute abnormality.  CTA Chest 6/23 IMPRESSION: No aortic  dissection or aneurysmal dilatation. No large central pulmonary embolus is noted. Multiple anterior rib fractures are noted bilaterally consistent with the recent CPR. Gastric catheter and endotracheal tube in satisfactory position. Changes of chronic granulomatous disease. Aortic atherosclerosis  Resolved Hospital  Problem List:     Assessment & Plan:  Cardiac arrest, unknown etiology Presented via EMS for witness cardiac arrest at time. Query component of stress, given day's events reported by wife. Markedly hypertensive and obtunded on arrival to ED, though labs not significantly out of normal range. Troponin elevated at 155. - Admit to ICU - F/u Echo, repeat EKG - Trend troponins - Consider formal cardiology consult - Goal euthermia - CTA Chest negative for aortic dissection/PE  Acute hypoxemic respiratory failure - Continue full vent support (4-8cc/kg IBW) - Wean FiO2 for O2 sat > 90% - Daily WUA/SBT - VAP bundle - Pulmonary hygiene - PAD protocol for sedation: Fentanyl for goal RASS 0 to -1  Acute encephalopathy in the setting of cardiac arrest Concern for seizure Remained obtunded even with ROSC. Somewhat rhythmic jerking of BUE, more likely shivering than seizure. Cannot r/o seizure, given continued AMS in the setting of cardiac arrest. - F/u STAT EEG - Consider Neuro consult - May require LTM, pending EEG findings - Weaning sedation as able, goal RASS 0 to -1  Acute kidney injury - Trend BMP - Replete electrolytes as indicated - Monitor I&Os - Avoid nephrotoxic agents as able - Ensure adequate renal perfusion  Transaminitis Likely in the setting of shock, cardiac arrest. - Trend LFTs until normalized  Hypertension, acute-on-chronic Hypertensive to SBP 200s on arrival to ED. - Became hypotensive on Propofol, transitioned to Fentanyl as alternative - Continue antihypertensives PRN - Goal SBP > 65  T2DM - CBGs Q4H - SSI  Best Practice (right click and "Reselect all SmartList Selections" daily)   Diet/type: NPO Pain/Anxiety/Delirium protocol Yes and RASS goal: 0 to -1 VAP protocol (if indicated): Yes DVT prophylaxis: prophylactic heparin  GI prophylaxis: PPI Glucose control:  SSI Central venous access:  N/A Arterial line:  N/A Foley:  Yes, and it is still  needed Mobility:  bed rest  PT consulted: N/A Studies pending: EEG and echo Culture data pending: None Last reviewed culture data: N/A Antibiotics:not indicated.  Antibiotic de-escalation: N/A Stop date: N/A Daily labs: ordered Code Status:  full code Last date of multidisciplinary goals of care discussion [6/23] Disposition: admit to the intensive care unit  Labs:   CBC: Recent Labs  Lab 09/16/20 1726 09/16/20 1731 09/16/20 1742  WBC 12.9*  --   --   NEUTROABS 7.2  --   --   HGB 13.3 13.3 13.3  HCT 38.9* 39.0 39.0  MCV 86.1  --   --   PLT 211  --   --     Basic Metabolic Panel: Recent Labs  Lab 09/16/20 1726 09/16/20 1731 09/16/20 1742  NA 136 138 138  K 3.6 3.5 3.7  CL 99 97*  --   CO2 30  --   --   GLUCOSE 122* 121*  --   BUN 21 23  --   CREATININE 1.35* 1.40*  --   CALCIUM 8.6*  --   --    GFR: CrCl cannot be calculated (Unknown ideal weight.). Recent Labs  Lab 09/16/20 1726  WBC 12.9*    Liver Function Tests: Recent Labs  Lab 09/16/20 1726  AST 122*  ALT 99*  ALKPHOS 76  BILITOT 0.9  PROT 7.5  ALBUMIN  4.0   Recent Labs  Lab 09/16/20 1726  LIPASE 54*   No results for input(s): AMMONIA in the last 168 hours.  ABG:    Component Value Date/Time   HCO3 28.6 (H) 09/16/2020 1742   TCO2 30 09/16/2020 1742   O2SAT 95.0 09/16/2020 1742     Coagulation Profile: Recent Labs  Lab 09/16/20 1726  INR 1.0    Cardiac Enzymes: No results for input(s): CKTOTAL, CKMB, CKMBINDEX, TROPONINI in the last 168 hours.  HbA1C: No results found for: HGBA1C  CBG: Recent Labs  Lab 09/16/20 1717  GLUCAP 104*    Review of Systems:   Patient is encephalopathic and/or intubated. Therefore history has been obtained from chart review/wife.   Past Medical History:  He,  has a past medical history of Diabetes mellitus without complication ([place]) and Hypertension.   Surgical History:  No past surgical history on file.   Social History:       Family History:  His family history is not on file.   Allergies No Known Allergies   Home Medications  Prior to Admission medications   Medication Sig Start Date End Date Taking? Authorizing Provider  aspirin EC 325 MG tablet Take 325 mg by mouth daily as needed (for headaches).   Yes [provider]  aspirin EC 81 MG tablet Take 81 mg by mouth in the morning. Swallow whole.   Yes [provider]  atenolol-chlorthalidone (TENORETIC) 100-25 MG per tablet Take 1 tablet by mouth in the morning.   Yes [provider]  atorvastatin (LIPITOR) 10 MG tablet Take 10 mg by mouth at bedtime.   Yes [provider]  diphenhydrAMINE (BENADRYL) 25 mg capsule Take 25 mg by mouth every 6 (six) hours as needed for allergies or sleep.   Yes [provider]  diphenhydramine-acetaminophen (TYLENOL PM) 25-500 MG TABS tablet Take 1-2 tablets by mouth at bedtime as needed (for sleep).   Yes [provider]  metFORMIN (GLUCOPHAGE) 500 MG tablet Take 500 mg by mouth 2 (two) times daily with a meal.   Yes [provider]  Multiple Vitamins-Minerals (PRESERVISION AREDS 2) CAPS Take 1 capsule by mouth 2 (two) times daily.   Yes [provider]  sertraline (ZOLOFT) 50 MG tablet Take 50 mg by mouth in the morning.   Yes [provider]   Critical care time: 50 minutes   Lestine Mount, PA-C [facility] 09/16/20 8:11 PM  Please see [email or web address] for pager details.  From 7A-7P if no response, please call [phone] After hours, please call E-Link [phone]

## 2020-09-16 NOTE — Progress Notes (Signed)
Pt not available for eeg , at this time . In imaging . Will try back

## 2020-09-16 NOTE — Progress Notes (Signed)
eLink Physician-Brief Progress Note Patient Name: Brian Robinson DOB: 08/31/1945 MRN: [id number]   Date of Service  09/16/2020  HPI/Events of Note  Multiple issues: 1. Patient arrives in ICU with Foley catheter. No order for Foley catheter, 2. Agitation - Request for Fentanyl bolus from infusion PRN and 3. HTN - BP = 164/93 and HR in 70's.  eICU Interventions  Plan: Continue Foley catheter. Fentanyl bolus from infusion 30 mcg Q 2 hours PRN. Labetolol 10 mg IV Q 2 hours PRN SBP > 170 or DBP > 100. Hold dose for HR < 60.     Intervention Category Major Interventions: Other:;Hypertension - evaluation and management;Delirium, psychosis, severe agitation - evaluation and management  [name] 09/16/2020, 11:11 PM

## 2020-09-16 NOTE — ED Provider Notes (Signed)
[facility] EMERGENCY DEPARTMENT Provider Note   CSN: [id number] Arrival date & time: 09/16/20  1715     History Chief Complaint  Patient presents with   Cardiac Arrest    Brian Robinson is a 75 y.o. male.  The history is provided by the EMS personnel and medical records. The history is limited by the condition of the patient.  Cardiac Arrest Witnessed by:  Family member Incident location:  Home Time since incident:  45 minutes Time before BLS initiated:  Immediate (Wife started CPR) Time before ALS initiated:  5-8 minutes Condition upon EMS arrival:  Apneic Pulse:  Absent Cardiac rhythm: Fire department used automated AED and reported 1 shock prior to EMS arrival. Airway:  Bag valve mask Rhythm on admission to ED:  Normal sinus Associated symptoms: loss of consciousness   Associated symptoms: no chest pain, no difficulty breathing, no dizziness, no fatigue, no heartburn, no palpitations, no shortness of breath, no syncope, no vomiting and no weakness   Risk factors: diabetes mellitus    History limited by patient mental status and lack of family numbers present on arrival.  Level 5 caveat applies.  Patient's wife tells me that he was in his normal state of health throughout the day. They were in the kitchen fixing dinner and he subsequently had a loss of consciousness and collapsed on the floor.  His wife says that it seemed like he was having trouble breathing and was unconscious. She started CPR immediately and it seemed like his breathing got slightly better. When fire department arrived he was reportedly pulseless and had a shockable rhythm.  He received 1 defibrillation.  When EMS arrived he had a palpable pulse and was in a sinus rhythm.  He was still unconscious.     Past Medical History:  Diagnosis Date   Diabetes mellitus without complication ([place])    Hypertension     There are no problems to display for this patient.   No past surgical  history on file.     No family history on file.     Home Medications Prior to Admission medications   Medication Sig Start Date End Date Taking? Authorizing Provider  atenolol-chlorthalidone (TENORETIC) 100-25 MG per tablet Take 1 tablet by mouth daily.    [provider]  beta carotene w/minerals (OCUVITE) tablet Take 1 tablet by mouth daily.    [provider]  Multiple Vitamin (MULTIVITAMIN) tablet Take 1 tablet by mouth daily.    [provider]    Allergies    Patient has no known allergies.  Review of Systems   Review of Systems  Unable to perform ROS: Patient unresponsive  Constitutional:  Negative for fatigue.  Respiratory:  Negative for shortness of breath.   Cardiovascular:  Negative for chest pain, palpitations and syncope.  Gastrointestinal:  Negative for heartburn and vomiting.  Neurological:  Positive for loss of consciousness. Negative for dizziness and weakness.   Physical Exam Updated Vital Signs BP (!) 203/99   Pulse 66   Temp (!) 95.8 F (35.4 C)   Resp 18   Ht 5\' 6"  (1.676 m)   SpO2 100%   BMI 37.48 kg/m   Physical Exam Constitutional:      General: He is in acute distress.     Appearance: He is overweight. He is ill-appearing and toxic-appearing.     Interventions: Face mask in place.  HENT:     Head: Atraumatic.     Mouth/Throat:  Lips: No lesions.     Mouth: Mucous membranes are moist.     Pharynx: Oropharynx is clear.  Eyes:     Pupils: Pupils are equal.     Right eye: Pupil is round and reactive.     Left eye: Pupil is round and reactive.  Cardiovascular:     Rate and Rhythm: Normal rate.     Pulses:          Radial pulses are 2+ on the right side and 2+ on the left side.     Heart sounds: Normal heart sounds.  Pulmonary:     Effort: Accessory muscle usage and respiratory distress present.     Breath sounds: Decreased air movement present.     Comments: Agonal respirations, gurgling sounds from upper  airway that do not clear with deep suctioning. Abdominal:     General: Abdomen is protuberant.     Palpations: Abdomen is soft.  Musculoskeletal:     Cervical back: Neck supple.     Right lower leg: No edema.     Left lower leg: No edema.  Skin:    General: Skin is cool.     Capillary Refill: Capillary refill takes 2 to 3 seconds.     Comments:  Diaphoretic  Neurological:     Mental Status: He is unresponsive.     GCS: GCS eye subscore is 1. GCS verbal subscore is 1. GCS motor subscore is 1.     Comments:  No purposeful movements. Not following commands or responding to painful stimuli. Intermittently bringing bilateral arms up onto chest like decorticate posturing. No tremors. Tone seems increased throughout.    ED Results / Procedures / Treatments   Labs (all labs ordered are listed, but only abnormal results are displayed) Labs Reviewed  CBC WITH DIFFERENTIAL/PLATELET - Abnormal; Notable for the following components:      Result Value   WBC 12.9 (*)    HCT 38.9 (*)    Lymphs Abs 4.2 (*)    Monocytes Absolute 1.1 (*)    Abs Immature Granulocytes 0.20 (*)    All other components within normal limits  COMPREHENSIVE METABOLIC PANEL - Abnormal; Notable for the following components:   Glucose, Bld 122 (*)    Creatinine, Ser 1.35 (*)    Calcium 8.6 (*)    AST 122 (*)    ALT 99 (*)    GFR, Estimated 54 (*)    All other components within normal limits  LIPASE, BLOOD - Abnormal; Notable for the following components:   Lipase 54 (*)    All other components within normal limits  CBG MONITORING, ED - Abnormal; Notable for the following components:   Glucose-Capillary 104 (*)    All other components within normal limits  I-STAT CHEM 8, ED - Abnormal; Notable for the following components:   Chloride 97 (*)    Creatinine, Ser 1.40 (*)    Glucose, Bld 121 (*)    Calcium, Ion 1.05 (*)    All other components within normal limits  I-STAT VENOUS BLOOD GAS, ED - Abnormal; Notable  for the following components:   pO2, Ven 80.0 (*)    Bicarbonate 28.6 (*)    Acid-Base Excess 3.0 (*)    Calcium, Ion 1.07 (*)    All other components within normal limits  TROPONIN I (HIGH SENSITIVITY) - Abnormal; Notable for the following components:   Troponin I (High Sensitivity) 155 (*)    All other components within normal limits  RESP PANEL BY RT-PCR (FLU A&B, COVID) [id number]  PROTIME-INR  TRIGLYCERIDES    EKG None  Radiology CT Head Wo Contrast  Result Date: 09/16/2020 CLINICAL DATA:  Altered mental status following cardiac arrest EXAM: CT HEAD WITHOUT CONTRAST CT CERVICAL SPINE WITHOUT CONTRAST TECHNIQUE: Multidetector CT imaging of the head and cervical spine was performed following the standard protocol without intravenous contrast. Multiplanar CT image reconstructions of the cervical spine were also generated. COMPARISON:  None. FINDINGS: CT HEAD FINDINGS Brain: Basal ganglia calcifications are identified. Mild atrophic changes and chronic white matter ischemic changes are seen. No findings to suggest acute hemorrhage, acute infarction or space-occupying mass lesion are seen. Vascular: No hyperdense vessel or unexpected calcification. Skull: Normal. Negative for fracture or focal lesion. Sinuses/Orbits: No acute finding. Other: None. CT CERVICAL SPINE FINDINGS Alignment: Within normal limits. Skull base and vertebrae: 7 cervical segments are well visualized. Vertebral body height is well maintained. Multilevel facet hypertrophic changes are noted. Osteophytic changes are seen as well. Disc space narrowing is seen at multiple levels. No acute fracture or acute facet abnormality is noted. Soft tissues and spinal canal: Surrounding soft tissue structures are within normal limits. Endotracheal tube and gastric catheter are seen although there distal aspects are not visualized on this exam. Upper chest: Visualized lung apices appear within normal limits. Other: None IMPRESSION: CT of the  head: No acute intracranial abnormality noted. Chronic atrophic and ischemic changes are seen. CT of the cervical spine: Multilevel degenerative change without acute abnormality. Electronically Signed   By: Inez Catalina M.D.   On: 09/16/2020 18:14   CT Cervical Spine Wo Contrast  Result Date: 09/16/2020 CLINICAL DATA:  Altered mental status following cardiac arrest EXAM: CT HEAD WITHOUT CONTRAST CT CERVICAL SPINE WITHOUT CONTRAST TECHNIQUE: Multidetector CT imaging of the head and cervical spine was performed following the standard protocol without intravenous contrast. Multiplanar CT image reconstructions of the cervical spine were also generated. COMPARISON:  None. FINDINGS: CT HEAD FINDINGS Brain: Basal ganglia calcifications are identified. Mild atrophic changes and chronic white matter ischemic changes are seen. No findings to suggest acute hemorrhage, acute infarction or space-occupying mass lesion are seen. Vascular: No hyperdense vessel or unexpected calcification. Skull: Normal. Negative for fracture or focal lesion. Sinuses/Orbits: No acute finding. Other: None. CT CERVICAL SPINE FINDINGS Alignment: Within normal limits. Skull base and vertebrae: 7 cervical segments are well visualized. Vertebral body height is well maintained. Multilevel facet hypertrophic changes are noted. Osteophytic changes are seen as well. Disc space narrowing is seen at multiple levels. No acute fracture or acute facet abnormality is noted. Soft tissues and spinal canal: Surrounding soft tissue structures are within normal limits. Endotracheal tube and gastric catheter are seen although there distal aspects are not visualized on this exam. Upper chest: Visualized lung apices appear within normal limits. Other: None IMPRESSION: CT of the head: No acute intracranial abnormality noted. Chronic atrophic and ischemic changes are seen. CT of the cervical spine: Multilevel degenerative change without acute abnormality. Electronically  Signed   By: Inez Catalina M.D.   On: 09/16/2020 18:14   DG Chest Portable 1 View  Result Date: 09/16/2020 CLINICAL DATA:  75 year male status post intubation. EXAM: PORTABLE CHEST 1 VIEW COMPARISON:  Chest radiograph dated 06/29/2011 FINDINGS: Endotracheal tube with tip approximately 4 cm above the carina. Enteric tube extends below the diaphragm with tip in the distal stomach. There is mild cardiomegaly with mild vascular congestion. No focal consolidation, pleural effusion, or pneumothorax. Atherosclerotic calcification of  the aorta. No acute osseous pathology. IMPRESSION: 1. Endotracheal tube above the carina. 2. Mild cardiomegaly with mild vascular congestion. No focal consolidation. Electronically Signed   By: Anner Crete M.D.   On: 09/16/2020 18:37    Procedures Procedure Name: Intubation Date/Time: 09/16/2020 6:30 PM Performed by: Pearson Grippe, DO Pre-anesthesia Checklist: Patient identified, Emergency Drugs available, Timeout performed, Suction available and Patient being monitored Oxygen Delivery Method: Ambu bag Preoxygenation: Pre-oxygenation with 100% oxygen Induction Type: Rapid sequence Ventilation: Mask ventilation without difficulty Laryngoscope Size: Glidescope Grade View: Grade I Tube size: 7.5 mm Number of attempts: 1 Airway Equipment and Method: Rigid stylet Placement Confirmation: ETT inserted through vocal cords under direct vision, Positive ETCO2 and Breath sounds checked- equal and bilateral Secured at: 23 cm Tube secured with: ETT holder Dental Injury: Teeth and Oropharynx as per pre-operative assessment       Medications Ordered in ED Medications  0.9 %  sodium chloride infusion (1,000 mLs Intravenous New Bag/Given 09/16/20 1718)  etomidate (AMIDATE) injection (20 mg Intravenous Given 09/16/20 1720)  rocuronium (ZEMURON) injection (100 mg Intravenous Given 09/16/20 1720)  propofol (DIPRIVAN) 1000 MG/100ML infusion (has no administration in  time range)  labetalol (NORMODYNE) injection 10 mg (has no administration in time range)  propofol (DIPRIVAN) 1000 MG/100ML infusion (10 mcg  New Bag/Given 09/16/20 1727)    ED Course  I have reviewed the triage vital signs and the nursing notes.  Pertinent labs & imaging results that were available during my care of the patient were reviewed by me and considered in my medical decision making (see chart for details).    MDM Rules/Calculators/A&P                          This is a 75 year old male with history of well-controlled diabetes and hypertension and no known previous cardiac history who presented to the emergency department after a cardiac arrest which ROSC was obtained after a single round of CPR and 1 defibrillation. He was in his normal state of health prior to this event.  Arrival to the emergency department he was not protecting his airway, had agonal respirations though not hypoxic on 100% nonrebreather.  He was intubated per procedure note above. EKG not consistent with STEMI. Initial troponin was 155. Spoke with cardiology about NSTEMI however they did not recommend anticoagulation with heparin. He had some posturing prior to intubation thus concern for intracranial hemorrhage was high on the differential.  However CT head did not show any acute hemorrhage. No prior history of seizures however this is also a consideration.  He was hypertensive consistently despite sedation with propofol in the emergency department.  Ordered for a dose of labetalol.  Blood work-up initiated to evaluate for evidence of other metabolic, infectious derangements that would suggest an etiology to his presentation.  Patient is critically ill for the following reasons: Acute hypoxic respiratory failure requiring intubation, acute encephalopathy of unknown etiology, status postcardiac arrest of unclear etiology.  Admitted to ICU for further work-up.  Remained hemodynamically stable while under my  care in the emergency department.   Final Clinical Impression(s) / ED Diagnoses Final diagnoses:  Cardiac arrest [facility])  Encephalopathy acute    Rx / DC Orders ED Discharge Orders     None        Pearson Grippe, DO 09/17/20 [place], [place], DO 09/17/20 [phone]

## 2020-09-16 NOTE — ED Notes (Signed)
Notified Cur[place] MD of initial trop of 155.

## 2020-09-17 ENCOUNTER — Inpatient Hospital Stay (HOSPITAL_COMMUNITY): Payer: Medicare Other

## 2020-09-17 ENCOUNTER — Encounter (HOSPITAL_COMMUNITY): Payer: Self-pay | Admitting: Pulmonary Disease

## 2020-09-17 ENCOUNTER — Other Ambulatory Visit (HOSPITAL_COMMUNITY): Payer: Medicare Other

## 2020-09-17 DIAGNOSIS — E876 Hypokalemia: Secondary | ICD-10-CM

## 2020-09-17 DIAGNOSIS — E119 Type 2 diabetes mellitus without complications: Secondary | ICD-10-CM

## 2020-09-17 DIAGNOSIS — I350 Nonrheumatic aortic (valve) stenosis: Secondary | ICD-10-CM

## 2020-09-17 DIAGNOSIS — G9340 Encephalopathy, unspecified: Secondary | ICD-10-CM

## 2020-09-17 DIAGNOSIS — I10 Essential (primary) hypertension: Secondary | ICD-10-CM

## 2020-09-17 DIAGNOSIS — I469 Cardiac arrest, cause unspecified: Secondary | ICD-10-CM

## 2020-09-17 DIAGNOSIS — E785 Hyperlipidemia, unspecified: Secondary | ICD-10-CM

## 2020-09-17 DIAGNOSIS — R9431 Abnormal electrocardiogram [ECG] [EKG]: Secondary | ICD-10-CM

## 2020-09-17 LAB — PHOSPHORUS: Phosphorus: 3.8 mg/dL (ref 2.5–4.6)

## 2020-09-17 LAB — GLUCOSE, CAPILLARY
Glucose-Capillary: 121 mg/dL — ABNORMAL HIGH (ref 70–99)
Glucose-Capillary: 129 mg/dL — ABNORMAL HIGH (ref 70–99)
Glucose-Capillary: 132 mg/dL — ABNORMAL HIGH (ref 70–99)
Glucose-Capillary: 138 mg/dL — ABNORMAL HIGH (ref 70–99)
Glucose-Capillary: 145 mg/dL — ABNORMAL HIGH (ref 70–99)
Glucose-Capillary: 156 mg/dL — ABNORMAL HIGH (ref 70–99)

## 2020-09-17 LAB — POCT I-STAT 7, (LYTES, BLD GAS, ICA,H+H)
Acid-Base Excess: 8 mmol/L — ABNORMAL HIGH (ref 0.0–2.0)
Bicarbonate: 34.5 mmol/L — ABNORMAL HIGH (ref 20.0–28.0)
Calcium, Ion: 1.15 mmol/L (ref 1.15–1.40)
HCT: 34 % — ABNORMAL LOW (ref 39.0–52.0)
Hemoglobin: 11.6 g/dL — ABNORMAL LOW (ref 13.0–17.0)
O2 Saturation: 100 %
Patient temperature: 37.5
Potassium: 3.3 mmol/L — ABNORMAL LOW (ref 3.5–5.1)
Sodium: 137 mmol/L (ref 135–145)
TCO2: 36 mmol/L — ABNORMAL HIGH (ref 22–32)
pCO2 arterial: 55.8 mmHg — ABNORMAL HIGH (ref 32.0–48.0)
pH, Arterial: 7.402 (ref 7.350–7.450)
pO2, Arterial: 475 mmHg — ABNORMAL HIGH (ref 83.0–108.0)

## 2020-09-17 LAB — COMPREHENSIVE METABOLIC PANEL WITH GFR
ALT: 71 U/L — ABNORMAL HIGH (ref 0–44)
AST: 101 U/L — ABNORMAL HIGH (ref 15–41)
Albumin: 3.4 g/dL — ABNORMAL LOW (ref 3.5–5.0)
Alkaline Phosphatase: 64 U/L (ref 38–126)
Anion gap: 11 (ref 5–15)
BUN: 22 mg/dL (ref 8–23)
CO2: 28 mmol/L (ref 22–32)
Calcium: 8.9 mg/dL (ref 8.9–10.3)
Chloride: 96 mmol/L — ABNORMAL LOW (ref 98–111)
Creatinine, Ser: 1.09 mg/dL (ref 0.61–1.24)
GFR, Estimated: >60 mL/min
Glucose, Bld: 148 mg/dL — ABNORMAL HIGH (ref 70–99)
Potassium: 2.9 mmol/L — ABNORMAL LOW (ref 3.5–5.1)
Sodium: 135 mmol/L (ref 135–145)
Total Bilirubin: 1.2 mg/dL (ref 0.3–1.2)
Total Protein: 6.4 g/dL — ABNORMAL LOW (ref 6.5–8.1)

## 2020-09-17 LAB — CBC
HCT: 32.2 % — ABNORMAL LOW (ref 39.0–52.0)
Hemoglobin: 11.1 g/dL — ABNORMAL LOW (ref 13.0–17.0)
MCH: 29 pg (ref 26.0–34.0)
MCHC: 34.5 g/dL (ref 30.0–36.0)
MCV: 84.1 fL (ref 80.0–100.0)
Platelets: 162 10*3/uL (ref 150–400)
RBC: 3.83 MIL/uL — ABNORMAL LOW (ref 4.22–5.81)
RDW: 13.2 % (ref 11.5–15.5)
WBC: 13.7 10*3/uL — ABNORMAL HIGH (ref 4.0–10.5)
nRBC: 0 % (ref 0.0–0.2)

## 2020-09-17 LAB — BASIC METABOLIC PANEL WITH GFR
Anion gap: 12 (ref 5–15)
BUN: 23 mg/dL (ref 8–23)
CO2: 31 mmol/L (ref 22–32)
Calcium: 9 mg/dL (ref 8.9–10.3)
Chloride: 91 mmol/L — ABNORMAL LOW (ref 98–111)
Creatinine, Ser: 1.2 mg/dL (ref 0.61–1.24)
GFR, Estimated: >60 mL/min
Glucose, Bld: 153 mg/dL — ABNORMAL HIGH (ref 70–99)
Potassium: 3.3 mmol/L — ABNORMAL LOW (ref 3.5–5.1)
Sodium: 134 mmol/L — ABNORMAL LOW (ref 135–145)

## 2020-09-17 LAB — ECHOCARDIOGRAM COMPLETE
AR max vel: 2.2 cm2
AV Area VTI: 1.88 cm2
AV Area mean vel: 1.98 cm2
AV Mean grad: 14 mmHg
AV Peak grad: 24.9 mmHg
Ao pk vel: 2.49 m/s
Area-P 1/2: 3.03 cm2
Calc EF: 53.6 %
Height: 66 in
S' Lateral: 3.2 cm
Single Plane A2C EF: 51.6 %
Single Plane A4C EF: 53.6 %
Weight: 3040.58 [oz_av]

## 2020-09-17 LAB — HEPARIN LEVEL (UNFRACTIONATED): Heparin Unfractionated: 0.51 IU/mL (ref 0.30–0.70)

## 2020-09-17 LAB — MRSA NEXT GEN BY PCR, NASAL: MRSA by PCR Next Gen: NOT DETECTED

## 2020-09-17 LAB — TROPONIN I (HIGH SENSITIVITY)
Troponin I (High Sensitivity): 831 ng/L (ref <18)
Troponin I (High Sensitivity): 665 ng/L (ref <18)

## 2020-09-17 LAB — TSH: TSH: 1.755 u[IU]/mL (ref 0.350–4.500)

## 2020-09-17 LAB — TRIGLYCERIDES: Triglycerides: 155 mg/dL — ABNORMAL HIGH (ref <150)

## 2020-09-17 LAB — MAGNESIUM
Magnesium: 1.8 mg/dL (ref 1.7–2.4)
Magnesium: 1.5 mg/dL — ABNORMAL LOW (ref 1.7–2.4)

## 2020-09-17 MED ORDER — ORAL CARE MOUTH RINSE
15.0000 mL | Freq: Two times a day (BID) | OROMUCOSAL | Status: DC
  Administered 2020-09-17 – 2020-09-21 (×5): 15 mL via OROMUCOSAL

## 2020-09-17 MED ORDER — POTASSIUM CHLORIDE 20 MEQ PO PACK
40.0000 meq | PACK | Freq: Once | ORAL | Status: AC
  Administered 2020-09-17: 40 meq
  Filled 2020-09-17: qty 2

## 2020-09-17 MED ORDER — PERFLUTREN LIPID MICROSPHERE
1.0000 mL | INTRAVENOUS | Status: AC | PRN
  Administered 2020-09-17: 2 mL via INTRAVENOUS
  Filled 2020-09-17: qty 10

## 2020-09-17 MED ORDER — HEPARIN (PORCINE) 25000 UT/250ML-% IV SOLN
1600.0000 [IU]/h | INTRAVENOUS | Status: DC
  Administered 2020-09-17 – 2020-09-18 (×2): 1100 [IU]/h via INTRAVENOUS
  Administered 2020-09-19: 1200 [IU]/h via INTRAVENOUS
  Filled 2020-09-17 (×3): qty 250

## 2020-09-17 MED ORDER — MAGNESIUM SULFATE 4 GM/100ML IV SOLN
4.0000 g | Freq: Once | INTRAVENOUS | Status: AC
  Administered 2020-09-17: 4 g via INTRAVENOUS
  Filled 2020-09-17: qty 100

## 2020-09-17 MED ORDER — ASPIRIN 300 MG RE SUPP
300.0000 mg | Freq: Once | RECTAL | Status: DC
  Filled 2020-09-17: qty 1

## 2020-09-17 MED ORDER — SODIUM CHLORIDE 0.9 % IV SOLN
INTRAVENOUS | Status: DC | PRN
  Administered 2020-09-17: 250 mL via INTRAVENOUS
  Administered 2020-09-20 – 2020-09-21 (×2): 1000 mL via INTRAVENOUS

## 2020-09-17 MED ORDER — ASPIRIN 81 MG PO CHEW
324.0000 mg | CHEWABLE_TABLET | Freq: Once | ORAL | Status: AC
  Administered 2020-09-17: 324 mg
  Filled 2020-09-17: qty 4

## 2020-09-17 NOTE — Progress Notes (Signed)
Patient's single gold wedding band given to wife at bedside.

## 2020-09-17 NOTE — Progress Notes (Signed)
  Echocardiogram 2D Echocardiogram has been performed.  Brian Robinson 09/17/2020, 8:54 AM

## 2020-09-17 NOTE — Procedures (Signed)
Extubation Procedure Note  Patient Details:   Name: Krew Hortman DOB: 1946-02-14 MRN: [id number]   Airway Documentation:    Vent end date: 09/17/20 Vent end time: 0755   Evaluation  O2 sats: stable throughout Complications: No apparent complications Patient did tolerate procedure well. Bilateral Breath Sounds: Clear, Diminished   Yes  RT extubated patient to 4L [place] per MD order with RN at bedside. Positive cuff leak noted. Patient tolerated well. No stridor noted at this time. RT will continue to monitor as needed.    Fabiola Backer 09/17/2020, 8:21 AM

## 2020-09-17 NOTE — Progress Notes (Signed)
ANTICOAGULATION CONSULT NOTE  Pharmacy Consult for Heparin Indication: chest pain/ACS  No Known Allergies  Patient Measurements: Height: 5\' 6"  (167.6 cm) Weight: 86.2 kg (190 lb 0.6 oz) IBW/kg (Calculated) : 63.8 Heparin Dosing Weight: 81.7 kg  Vital Signs: Temp: 98.6 F (37 C) (06/24 1530) Temp Source: Oral (06/24 1200) BP: 142/68 (06/24 1530) Pulse Rate: 74 (06/24 1530)  Labs: Recent Labs    09/16/20 1726 09/16/20 1731 09/16/20 1742 09/16/20 1927 09/16/20 2009 09/16/20 2103 09/16/20 2238 09/17/20 0153 09/17/20 0336 09/17/20 0829 09/17/20 1059 09/17/20 1516  HGB 13.3 13.3   < >  --  12.4* 10.2*  --  11.1* 11.6*  --   --   --   HCT 38.9* 39.0   < >  --  36.0* 30.0*  --  32.2* 34.0*  --   --   --   PLT 211  --   --   --  174  --   --  162  --   --   --   --   LABPROT 13.5  --   --   --   --   --   --   --   --   --   --   --   INR 1.0  --   --   --   --   --   --   --   --   --   --   --   HEPARINUNFRC  --   --   --   --   --   --   --   --   --   --   --  0.51  CREATININE 1.35* 1.40*  --   --   --   --  1.20 1.09  --   --   --   --   TROPONINIHS 155*  --   --  1,177*  --   --   --   --   --  831* 665*  --    < > = values in this interval not displayed.     Estimated Creatinine Clearance: 59.4 mL/min (by C-G formula based on SCr of 1.09 mg/dL).   Assessment: 75 yo male presents with out-of-hospital cardiac arrest. Troponins jumped from 155 to 1177. Pharmacy asked to dose heparin for possible ACS.  No anticoagulation noted PTA. Last dose of SQ heparin for DVT PPX on 6/24 at 0511. CBC stable, Hgb 11.1, pltc 162. No overt bleeding noted.   Heparin level therapeutic; no bleeding reported.  Goal of Therapy:  Heparin level 0.3-0.7 units/ml Monitor platelets by anticoagulation protocol: Yes   Plan:  Continue heparin infusion at 1100 units/hr Monitor daily HL, CBC, s/sx bleeding  Jinna Weinman D. Mina Marble, PharmD, BCPS, [place] 09/17/2020, 4:03 PM

## 2020-09-17 NOTE — Consult Note (Addendum)
Cardiology Consultation:   Patient ID: Brian Robinson MRN: [id number]; DOB: 13-Oct-1945  Admit date: 09/16/2020 Date of Consult: 09/17/2020  PCP:  Seward Carol, MD   [facility] Providers Cardiologist:  None   New to Dr. Joanie Coddington here to update MD or APP on Care Team, Refresh:1}     Patient Profile:   Brian Robinson ("Fee-ster") is a 75 y.o. male with a hx of HTN, DM, heart murmur (mild AS by echo today), Covid 01/2020 who is being seen 09/17/2020 for the evaluation of cardiac arrest at the request of Dr. Tamala Julian.  History of Present Illness:   Brian Robinson [facility]) has no known cardiac history aside from heart murmur found by PCP with a prior echo reported to be reassuring. No prior ischemic testing. No fam hx of CAD or tobacco use. Had Covid 01/2020 and received Mab.  He's been largely in his USOH lately although had noted to family he's been feeling more easily fatigued lately. A few weeks ago he was outside in the heat with family and had to sit down because of lightheadedness and weakness. This resolved after drinking water and getting inside to the cool air. Yesterday was a normal day for the family - went to [employer], [employer], etc. Later in the afternoon he and his wife were opening the mail and got an unexpected bill from [employer] they thought they had paid. He was grabbing his checkbook to review and suddenly collapsed backwards and was gurgling. Patient's wife called 43 and they coached her through chest compressions. Upon fire department arrival, patient was pulseless (CPR reportedly started within 5-10 minutes of arrest). Notes indicate defibrillator showed shockable rhythm and was shocked x1 with ROSC. Strips not available. He had pulses on EMS arrival but remained obtunded with possible posturing so acute heart catheterization was deferred pending neurologic stability. Upon arrival he was hypertensive and hypothermic. CT head nonacute, only chronic atrophic/ischemic changes. CT  C-spine negative for acute injury. Labs showed AKI with Cr 1.35, transaminitis, leukocytosis, negative Covid, with most recent labs showing significant hypokalemia of 2.9->3.3 and hypomagnesemia of 1.5 (being repleted). Troponins 155 -> 1177 -> 831. CT angio showed no aortic dissection or aneurysmal dilation, no large central PE, multiple rib fractures c/w recent CPR, + aortic atherosclerosis, and changes of chronic granulomatous disease. 2D Echo showed mild hypokinesis of the distal septum with overall preserved LVEF 55-60%, mild LVH, grade 2 DD, normal RV, mild AS. EEG with generalized nonspecific cerebral dysfunction (encephalopathy).  Overnight he had some episodic transient brief atrial runs and nonsustained junctional bradycardia (upper 30s/mid 40s) - bradycardia was observed when fentanyl was increased, so this was turned down with improvement in rhythm. He only had one other brief episode of junctional bradycardia around 10:30am, otherwise since then remains NSR with occasional PACs, PVCs at this time. He was extubated this afternoon and is hemodynamically stable, O2 sat 100% on 4L [place]. He is able to follow commands, nod/yes and no, but has some hesitancy of speech. At times he does not make an attempt to speak. His daughter has observed he was able to say "okay" and "I love you" but with slow cadence of speech. Other times he is able to answer short words. His nurse reported this was consistent with her experience this morning as well. Notified PCCM of this finding who will come evaluate as well, they thought perhaps related to his sedatives.   Past Medical History:  Diagnosis Date   COVID-19    received Mab 01/2020  Diabetes mellitus without complication (HCC)    Heart murmur    Hypertension    Mild aortic stenosis     No past surgical history on file.   Home Medications:  Prior to Admission medications   Medication Sig Start Date End Date Taking? Authorizing Provider  aspirin EC 325 MG  tablet Take 325 mg by mouth daily as needed (for headaches).   Yes [provider]  aspirin EC 81 MG tablet Take 81 mg by mouth in the morning. Swallow whole.   Yes [provider]  atenolol-chlorthalidone (TENORETIC) 100-25 MG per tablet Take 1 tablet by mouth in the morning.   Yes [provider]  atorvastatin (LIPITOR) 10 MG tablet Take 10 mg by mouth at bedtime.   Yes [provider]  diphenhydrAMINE (BENADRYL) 25 mg capsule Take 25 mg by mouth every 6 (six) hours as needed for allergies or sleep.   Yes [provider]  diphenhydramine-acetaminophen (TYLENOL PM) 25-500 MG TABS tablet Take 1-2 tablets by mouth at bedtime as needed (for sleep).   Yes [provider]  metFORMIN (GLUCOPHAGE) 500 MG tablet Take 500 mg by mouth 2 (two) times daily with a meal.   Yes [provider]  Multiple Vitamins-Minerals (PRESERVISION AREDS 2) CAPS Take 1 capsule by mouth 2 (two) times daily.   Yes [provider]  sertraline (ZOLOFT) 50 MG tablet Take 50 mg by mouth in the morning.   Yes [provider]    Inpatient Medications: Scheduled Meds:  chlorhexidine gluconate (MEDLINE KIT)  15 mL Mouth Rinse BID   Chlorhexidine Gluconate Cloth  6 each Topical Daily   mouth rinse  15 mL Mouth Rinse 10 times per day   Continuous Infusions:  sodium chloride 10 mL/hr at 09/17/20 1200   heparin 1,100 Units/hr (09/17/20 1200)   PRN Meds: sodium chloride, docusate sodium, labetalol, polyethylene glycol  Allergies:   No Known Allergies  Social History:   Social History   Socioeconomic History   Marital status: Married    Spouse name: Not on file   Number of children: Not on file   Years of education: Not on file   Highest education level: Not on file  Occupational History   Not on file  Tobacco Use   Smoking status: Not on file   Smokeless tobacco: Not on file  Substance and Sexual Activity   Alcohol use: Not on file    Drug use: Not on file   Sexual activity: Not on file  Other Topics Concern   Not on file  Social History Narrative   Not on file   Social Determinants of Health   Financial Resource Strain: Not on file  Food Insecurity: Not on file  Transportation Needs: Not on file  Physical Activity: Not on file  Stress: Not on file  Social Connections: Not on file  Intimate Partner Violence: Not on file    Family History:    Family History  Problem Relation Age of Onset   Cancer Mother    Cancer Father    CAD Neg Hx      ROS:  Please see the history of present illness.  All other ROS reviewed and negative.     Physical Exam/Data:   Vitals:   09/17/20 1000 09/17/20 1100 09/17/20 1200 09/17/20 1300  BP: (!) 143/66 135/67 133/64 122/62  Pulse: 66 66 63 62  Resp: 12 16 15 13   Temp: 98.78 F (37.1 C) 98.6 F (37 C) 98.42  F (36.9 C) 98.42 F (36.9 C)  TempSrc:   Oral   SpO2: 100% 100% 100% 98%  Weight:      Height:        Intake/Output Summary (Last 24 hours) at 09/17/2020 1315 Last data filed at 09/17/2020 1200 Gross per 24 hour  Intake 321.74 ml  Output 605 ml  Net -283.26 ml   Last 3 Weights 09/17/2020 09/17/2020 09/16/2020  Weight (lbs) 190 lb 0.6 oz 190 lb 0.6 oz 190 lb 0.6 oz  Weight (kg) 86.2 kg 86.2 kg 86.2 kg     Body mass index is 30.67 kg/m.  General: Well developed, well nourished WM in no acute distress. Head: Normocephalic, atraumatic, sclera non-icteric, no xanthomas, nares are without discharge. Neck: Negative for carotid bruits. JVP not elevated. Lungs: Clear bilaterally to auscultation without wheezes, rales, or rhonchi. Breathing is unlabored. Heart: RRR S1 S2 2/6 SEM RUSB without rubs or gallops.  Abdomen: Soft, non-tender, non-distended with normoactive bowel sounds. No rebound/guarding. Extremities: No clubbing or cyanosis. No edema. Distal pedal pulses are 2+ and equal bilaterally. Neuro: Alert/awake. Follows commands, but slow to respond with  speaking. Did not speak for me when prompted but was able to say few words with Dr. Claiborne Billings. Moves all extremities spontaneously. No facial asymmetry or acute strength deficits. Psych:  Responds to questions appropriately with a normal affect.   EKG:  The EKG was personally reviewed and demonstrates:    Telemetry:  Telemetry was personally reviewed and demonstrates:  Initial tracing: appears to be sinus tach 106bpm, baseline artifact and wander making full assessment challenging - nonspecific TW changes V5-V6 Follow-up 6/23 1722: SB or ectopic atrial rhythm 45bpm, NSIVCD, low P wave amplitude best seen in V2, nonspecific STTW changes inferiorly and V4-V6 Follow-up 6/24 0900: NSR 67pm, NISVCD, occasional PAC, nonspecific STTW changes I, II, avL, V5-V6 - lead III appears to be a different deflection   Relevant CV Studies:  2D Echo 09/17/20  1. Mild hypokinesis of the distal septum with overall preserved LV  function.   2. Left ventricular ejection fraction, by estimation, is 55 to 60%. The  left ventricle has normal function. The left ventricle demonstrates  regional wall motion abnormalities (see scoring diagram/findings for  description). There is mild left ventricular   hypertrophy. Left ventricular diastolic parameters are consistent with  Grade II diastolic dysfunction (pseudonormalization). Elevated left atrial  pressure.   3. Right ventricular systolic function is normal. The right ventricular  size is normal.   4. The mitral valve is normal in structure. Trivial mitral valve  regurgitation. No evidence of mitral stenosis. Moderate mitral annular  calcification.   5. The aortic valve is calcified. Aortic valve regurgitation is not  visualized. Mild aortic valve stenosis.   6. The inferior vena cava is normal in size with greater than 50%  respiratory variability, suggesting right atrial pressure of 3 mmHg.     Laboratory Data:  High Sensitivity Troponin:   Recent Labs  Lab  09/16/20 1726 09/16/20 1927 09/17/20 0829 09/17/20 1059  TROPONINIHS 155* 1,177* 831* 665*     Chemistry Recent Labs  Lab 09/16/20 1726 09/16/20 1731 09/16/20 1742 09/16/20 2238 09/17/20 0153 09/17/20 0336  NA 136 138   < > 134* 135 137  K 3.6 3.5   < > 3.3* 2.9* 3.3*  CL 99 97*  --  91* 96*  --   CO2 30  --   --  31 28  --   GLUCOSE 122* 121*  --  153* 148*  --   BUN 21 23  --  23 22  --   CREATININE 1.35* 1.40*  --  1.20 1.09  --   CALCIUM 8.6*  --   --  9.0 8.9  --   GFRNONAA 54*  --   --  >60 >60  --   ANIONGAP 7  --   --  12 11  --    < > = values in this interval not displayed.    Recent Labs  Lab 09/16/20 1726 09/17/20 0153  PROT 7.5 6.4*  ALBUMIN 4.0 3.4*  AST 122* 101*  ALT 99* 71*  ALKPHOS 76 64  BILITOT 0.9 1.2   Hematology Recent Labs  Lab 09/16/20 1726 09/16/20 1731 09/16/20 2009 09/16/20 2103 09/17/20 0153 09/17/20 0336  WBC 12.9*  --  17.7*  --  13.7*  --   RBC 4.52  --  4.18*  --  3.83*  --   HGB 13.3   < > 12.4* 10.2* 11.1* 11.6*  HCT 38.9*   < > 36.0* 30.0* 32.2* 34.0*  MCV 86.1  --  86.1  --  84.1  --   MCH 29.4  --  29.7  --  29.0  --   MCHC 34.2  --  34.4  --  34.5  --   RDW 13.2  --  13.2  --  13.2  --   PLT 211  --  174  --  162  --    < > = values in this interval not displayed.   BNPNo results for input(s): BNP, PROBNP in the last 168 hours.  DDimer No results for input(s): DDIMER in the last 168 hours.   Radiology/Studies:  CT Head Wo Contrast  Result Date: 09/16/2020 CLINICAL DATA:  Altered mental status following cardiac arrest EXAM: CT HEAD WITHOUT CONTRAST CT CERVICAL SPINE WITHOUT CONTRAST TECHNIQUE: Multidetector CT imaging of the head and cervical spine was performed following the standard protocol without intravenous contrast. Multiplanar CT image reconstructions of the cervical spine were also generated. COMPARISON:  None. FINDINGS: CT HEAD FINDINGS Brain: Basal ganglia calcifications are identified. Mild atrophic  changes and chronic white matter ischemic changes are seen. No findings to suggest acute hemorrhage, acute infarction or space-occupying mass lesion are seen. Vascular: No hyperdense vessel or unexpected calcification. Skull: Normal. Negative for fracture or focal lesion. Sinuses/Orbits: No acute finding. Other: None. CT CERVICAL SPINE FINDINGS Alignment: Within normal limits. Skull base and vertebrae: 7 cervical segments are well visualized. Vertebral body height is well maintained. Multilevel facet hypertrophic changes are noted. Osteophytic changes are seen as well. Disc space narrowing is seen at multiple levels. No acute fracture or acute facet abnormality is noted. Soft tissues and spinal canal: Surrounding soft tissue structures are within normal limits. Endotracheal tube and gastric catheter are seen although there distal aspects are not visualized on this exam. Upper chest: Visualized lung apices appear within normal limits. Other: None IMPRESSION: CT of the head: No acute intracranial abnormality noted. Chronic atrophic and ischemic changes are seen. CT of the cervical spine: Multilevel degenerative change without acute abnormality. Electronically Signed   By: Inez Catalina M.D.   On: 09/16/2020 18:14   CT Cervical Spine Wo Contrast  Result Date: 09/16/2020 CLINICAL DATA:  Altered mental status following cardiac arrest EXAM: CT HEAD WITHOUT CONTRAST CT CERVICAL SPINE WITHOUT CONTRAST TECHNIQUE: Multidetector CT imaging of the head and cervical spine was performed following the standard protocol without intravenous contrast. Multiplanar CT  image reconstructions of the cervical spine were also generated. COMPARISON:  None. FINDINGS: CT HEAD FINDINGS Brain: Basal ganglia calcifications are identified. Mild atrophic changes and chronic white matter ischemic changes are seen. No findings to suggest acute hemorrhage, acute infarction or space-occupying mass lesion are seen. Vascular: No hyperdense vessel or  unexpected calcification. Skull: Normal. Negative for fracture or focal lesion. Sinuses/Orbits: No acute finding. Other: None. CT CERVICAL SPINE FINDINGS Alignment: Within normal limits. Skull base and vertebrae: 7 cervical segments are well visualized. Vertebral body height is well maintained. Multilevel facet hypertrophic changes are noted. Osteophytic changes are seen as well. Disc space narrowing is seen at multiple levels. No acute fracture or acute facet abnormality is noted. Soft tissues and spinal canal: Surrounding soft tissue structures are within normal limits. Endotracheal tube and gastric catheter are seen although there distal aspects are not visualized on this exam. Upper chest: Visualized lung apices appear within normal limits. Other: None IMPRESSION: CT of the head: No acute intracranial abnormality noted. Chronic atrophic and ischemic changes are seen. CT of the cervical spine: Multilevel degenerative change without acute abnormality. Electronically Signed   By: Inez Catalina M.D.   On: 09/16/2020 18:14   DG Chest Port 1 View  Result Date: 09/17/2020 CLINICAL DATA:  Endotracheal tube. EXAM: PORTABLE CHEST 1 VIEW COMPARISON:  CT angiography 09/16/2020, chest x-ray 09/16/2020. FINDINGS: Endotracheal tube approximately 3.5 cm above the carina. Enteric tube coursing below the hemidiaphragm with tip and side port collimated off view. The heart size and mediastinal contours are unchanged. Interval development of linear atelectasis within the left lower lung zone. No pulmonary edema. No pleural effusion. No pneumothorax. No acute osseous abnormality. IMPRESSION: 1. Left lower lung zone airspace opacity likely atelectasis. 2. Lines and tubes are stable. Electronically Signed   By: Iven Finn M.D.   On: 09/17/2020 04:56   DG Chest Portable 1 View  Result Date: 09/16/2020 CLINICAL DATA:  75 year male status post intubation. EXAM: PORTABLE CHEST 1 VIEW COMPARISON:  Chest radiograph dated  06/29/2011 FINDINGS: Endotracheal tube with tip approximately 4 cm above the carina. Enteric tube extends below the diaphragm with tip in the distal stomach. There is mild cardiomegaly with mild vascular congestion. No focal consolidation, pleural effusion, or pneumothorax. Atherosclerotic calcification of the aorta. No acute osseous pathology. IMPRESSION: 1. Endotracheal tube above the carina. 2. Mild cardiomegaly with mild vascular congestion. No focal consolidation. Electronically Signed   By: Anner Crete M.D.   On: 09/16/2020 18:37   CT ANGIO CHEST AORTA W/CM & OR WO/CM  Result Date: 09/16/2020 CLINICAL DATA:  Chest and back pain, initial encounter EXAM: CT ANGIOGRAPHY CHEST WITH CONTRAST TECHNIQUE: Multidetector CT imaging of the chest was performed using the standard protocol during bolus administration of intravenous contrast. Multiplanar CT image reconstructions and MIPs were obtained to evaluate the vascular anatomy. CONTRAST:  32m OMNIPAQUE IOHEXOL 350 MG/ML SOLN COMPARISON:  Chest x-ray from earlier in the same day. FINDINGS: Cardiovascular: Initial precontrast images were obtained and reveal atherosclerotic calcifications of the thoracic aorta. No aneurysmal dilatation is noted. Postcontrast images were then obtained which reveal no aneurysmal dilatation or evidence of dissection. Coronary calcifications are noted. No cardiac enlargement is seen. Pulmonary artery shows no large central pulmonary embolus although not timed for embolus evaluation. Mediastinum/Nodes: Thoracic inlet is within normal limits. No sizable hilar or mediastinal adenopathy is noted. A few calcified lymph nodes are seen consistent with prior granulomatous disease. Gastric catheter extends into the distal stomach. Endotracheal tube  is seen well above the carina. Lungs/Pleura: Scattered calcified granulomas are noted. Mild posterior basilar atelectasis is seen bilaterally. Small effusions are seen as well. No focal confluent  infiltrate or pneumothorax is noted. Upper Abdomen: Visualized upper abdomen demonstrates multiple calcified granulomas. No other focal abnormality is noted. Musculoskeletal: Degenerative changes of the thoracic spine are noted. Multiple anterior rib fractures are noted bilaterally consistent with the given clinical history of recent CPR. Review of the MIP images confirms the above findings. IMPRESSION: No aortic dissection or aneurysmal dilatation. No large central pulmonary embolus is noted. Multiple anterior rib fractures are noted bilaterally consistent with the recent CPR. Gastric catheter and endotracheal tube in satisfactory position. Changes of chronic granulomatous disease. Aortic Atherosclerosis (ICD10-I70.0). Electronically Signed   By: Inez Catalina M.D.   On: 09/16/2020 21:01   ECHOCARDIOGRAM COMPLETE  Result Date: 09/17/2020    ECHOCARDIOGRAM REPORT   Patient Name:   KELLIS MCADAM Date of Exam: 09/17/2020 Medical Rec #:  [id number]      Height:       66.0 in Accession #:    [id number]     Weight:       190.0 lb Date of Birth:  25-May-1945       BSA:          1.957 m Patient Age:    71 years       BP:           131/66 mmHg Patient Gender: M              HR:           67 bpm. Exam Location:  Inpatient Procedure: 2D Echo, 3D Echo, Cardiac Doppler, Color Doppler and Intracardiac            Opacification Agent STAT ECHO                             MODIFIED REPORT: This report was modified by Kirk Ruths MD on 09/17/2020 due to Change.  Indications:     I49.9* Cardiac arrhythmia, unspecified. Cardiac arrest  History:         Patient has no prior history of Echocardiogram examinations.                  Abnormal ECG; Arrythmias:Cardiac Arrest.  Sonographer:     Roseanna Rainbow RDCS Referring Phys:  [id number] Candee Furbish Diagnosing Phys: Kirk Ruths MD  Sonographer Comments: Technically difficult study due to poor echo windows, suboptimal apical window and suboptimal subcostal window. Image acquisition  challenging due to patient body habitus and Image acquisition challenging due to respiratory motion. IMPRESSIONS  1. Mild hypokinesis of the distal septum with overall preserved LV function; calcified aortic valve with mild AS by doppler but visually looks moderate.  2. Left ventricular ejection fraction, by estimation, is 55 to 60%. The left ventricle has normal function. The left ventricle demonstrates regional wall motion abnormalities (see scoring diagram/findings for description). There is mild left ventricular  hypertrophy. Left ventricular diastolic parameters are consistent with Grade II diastolic dysfunction (pseudonormalization). Elevated left atrial pressure.  3. Right ventricular systolic function is normal. The right ventricular size is normal.  4. The mitral valve is normal in structure. Trivial mitral valve regurgitation. No evidence of mitral stenosis. Moderate mitral annular calcification.  5. The aortic valve is calcified. Aortic valve regurgitation is not visualized. Mild aortic valve stenosis.  6. The inferior vena cava is  normal in size with greater than 50% respiratory variability, suggesting right atrial pressure of 3 mmHg. FINDINGS  Left Ventricle: Left ventricular ejection fraction, by estimation, is 55 to 60%. The left ventricle has normal function. The left ventricle demonstrates regional wall motion abnormalities. Definity contrast agent was given IV to delineate the left ventricular endocardial borders. The left ventricular internal cavity size was normal in size. There is mild left ventricular hypertrophy. Left ventricular diastolic parameters are consistent with Grade II diastolic dysfunction (pseudonormalization). Elevated left atrial pressure. Right Ventricle: The right ventricular size is normal. Right ventricular systolic function is normal. Left Atrium: Left atrial size was normal in size. Right Atrium: Right atrial size was normal in size. Pericardium: There is no evidence of  pericardial effusion. Mitral Valve: The mitral valve is normal in structure. Moderate mitral annular calcification. Trivial mitral valve regurgitation. No evidence of mitral valve stenosis. Tricuspid Valve: The tricuspid valve is normal in structure. Tricuspid valve regurgitation is trivial. No evidence of tricuspid stenosis. Aortic Valve: The aortic valve is calcified. Aortic valve regurgitation is not visualized. Mild aortic stenosis is present. Aortic valve mean gradient measures 14.0 mmHg. Aortic valve peak gradient measures 24.9 mmHg. Aortic valve area, by VTI measures 1.88 cm. Pulmonic Valve: The pulmonic valve was normal in structure. Pulmonic valve regurgitation is not visualized. No evidence of pulmonic stenosis. Aorta: The aortic root is normal in size and structure. Venous: The inferior vena cava is normal in size with greater than 50% respiratory variability, suggesting right atrial pressure of 3 mmHg.  Additional Comments: Mild hypokinesis of the distal septum with overall preserved LV function; calcified aortic valve with mild AS by doppler but visually looks moderate.  LEFT VENTRICLE PLAX 2D LVIDd:         4.40 cm      Diastology LVIDs:         3.20 cm      LV e' medial:    3.38 cm/s LV PW:         1.30 cm      LV E/e' medial:  25.7 LV IVS:        1.30 cm      LV e' lateral:   4.82 cm/s LVOT diam:     2.20 cm      LV E/e' lateral: 18.0 LV SV:         97 LV SV Index:   50 LVOT Area:     3.80 cm  LV Volumes (MOD) LV vol d, MOD A2C: 54.1 ml LV vol d, MOD A4C: 119.0 ml LV vol s, MOD A2C: 26.2 ml LV vol s, MOD A4C: 55.2 ml LV SV MOD A2C:     27.9 ml LV SV MOD A4C:     119.0 ml LV SV MOD BP:      46.1 ml RIGHT VENTRICLE             IVC RV S prime:     10.50 cm/s  IVC diam: 2.00 cm TAPSE (M-mode): 2.4 cm LEFT ATRIUM             Index       RIGHT ATRIUM           Index LA diam:        4.60 cm 2.35 cm/m  RA Area:     13.10 cm LA Vol (A2C):   28.8 ml 14.72 ml/m RA Volume:   27.20 ml  13.90 ml/m LA Vol  (A4C):   57.6  ml 29.44 ml/m LA Biplane Vol: 42.7 ml 21.82 ml/m  AORTIC VALVE AV Area (Vmax):    2.20 cm AV Area (Vmean):   1.98 cm AV Area (VTI):     1.88 cm AV Vmax:           249.33 cm/s AV Vmean:          173.000 cm/s AV VTI:            0.515 m AV Peak Grad:      24.9 mmHg AV Mean Grad:      14.0 mmHg LVOT Vmax:         144.50 cm/s LVOT Vmean:        90.200 cm/s LVOT VTI:          0.255 m LVOT/AV VTI ratio: 0.50  AORTA Ao Root diam: 3.60 cm Ao Asc diam:  3.70 cm MITRAL VALVE MV Area (PHT): 3.03 cm    SHUNTS MV Decel Time: 250 msec    Systemic VTI:  0.26 m MV E velocity: 87.00 cm/s  Systemic Diam: 2.20 cm MV A velocity: 57.60 cm/s MV E/A ratio:  1.51 Kirk Ruths MD Electronically signed by Kirk Ruths MD Signature Date/Time: 09/17/2020/11:05:43 AM    Final (Updated)      Assessment and Plan:   1. Cardiac arrest (suspect VT/VF based on defibrillator shock) - etiology not clear at this time, but concern for ischemic event given abnormal EKG and troponin elevation -> see #2 - CTA negative for PE and dissection - 2D echo shows mild hypokinesis of the distal septum with overall preserved LVEF 55-60%, mild LVH, grade 2 DD, normal RV, mild AS - follow on telemetry  - correct lytes as below - if no reversible causes for VT/VF identified, consider EP consultation this admission  2. NSTEMI - troponin peak 1177 - unknown at this time if this represents primary ACS or sequelae of demand ischemia but in the context of cardiac arrest, needs further evaluation - aspirin 325m given this AM, started on heparin per pharmacy - will need to revisit continuation of aspirin order in AM based on ability to take orals. If he is still NPO and no contraindications, would give rectally - avoid BB given junctional bradycardia overnight - start statin when reliably taking orals  - check lipids/LFTs in AM - we tentatively put him on the cath board for Monday at 7:30am with Dr. CHarrell GaveEnd but will need orders  this weekend when plan for cath finalized contingent on neurologic status  3. Anoxic encephalopathy present on admission - relayed to Dr. STamala Julianregarding what seems like intermittent hesitancy of speech, he will evaluate patient as well - CT head nonacute but question whether he requires neuro eval/MRI if this persists  4. Junctional bradycardia - primarily seemed to occur with sedation overnight, improved with decrease in fentanyl - continue to follow on telemetry - he is on atenolol at home and [facility] still have some degree of BB in his system, watch for BB rebound - check TSH  5. Mild aortic stenosis - unlikely to be clinically contributing but will need OP follow-up (typically repeat echo 3-5 years or as clinically indicated)  6. Essential HTN - SBPs 130s-140s at present time, follow for now  7. Diabetes mellitus - check A1C with AM labs - otherwise defer management to ICU team  8. Lab findings to include transaminitis, AKI, leukocytosis - suspect sequelae of cardiac arrest  9. Hypokalemia, hypomagnesemia -  primary team repleting, recommend keep Mg at least 2 and K at least 4   Risk Assessment/Risk Scores:     TIMI Risk Score for Unstable Angina or Non-ST Elevation MI:   The patient's TIMI risk score is 2, which indicates a 8% risk of all cause mortality, new or recurrent myocardial infarction or need for urgent revascularization in the next 14 days.   For questions or updates, please contact [place] Please consult [email or web address] for contact info under    Signed, Charlie Pitter, PA-C  09/17/2020 1:15 PM   Patient seen and examined. Agree with assessment and plan.  Mr. Brian Robinson is a very pleasant 75 year old gentleman who denies any known coronary artery disease.  He was previously told of having a cardiac murmur and has a history of hypertension for which he was taking atenolol/blood and hyperlipidemia on atorvastatin 10 mg.  Recently may  have developed some mild shortness of breath with activity.  Yesterday suffered a witnessed cardiac arrest suddenly collapsing backward and began gurgling.  His wife called 54 and was coached through CPR initiation.  He was successfully shocked once fire department arrived with a shockable rhythm with ROSC.  Upon arrival to the emergency room he was obtunded with possible posturing.  Acute catheterization was deferred pending neurologic stability.  Upon arrival he was hypertensive and hypothermic.  No acute changes were noted on head CT although there were some chronic atrophic/ischemic changes.  Laboratories notable for creatinine 1.35.  Calcium 2.9 and magnesium 1.5.  Troponins increased to 1177 before returning downward.  CT did not show any aortic dissection or aneurysm and there was no evidence for PE.  He was found to have multiple rib fractures consistent with his CPR.  There also was aortic atherosclerosis.  An echo Doppler study has shown an EF at 55 to 60% with grade 2 diastolic dysfunction.  There was mild hypokinesis of the distal septum.  The patient had taken his morning atenolol chlorthalidone combination.  On telemetry last evening he was sinus bradycardia but had periods of slowing down and episodes of junctional rhythm with heart rate in the upper 30s to 40s.  He had received fentanyl which has been discontinued.  Actually there was some concern about aphasia, however recently he is now able to speak his name and also was able to speak his daughter and wife's name.  He is not having any chest pain.  Blood pressure is stable at 122/62.  Pulse is in the 60s.  He is alert.  There is no JVD.  Lungs are relatively clear.  Rhythm is regular with a 2/6 murmur consistent with aortic stenosis.  There was no S3 gallop.  Abdomen nontender with positive bowel sounds.  There was no clubbing cyanosis or edema.  His ECGs have been reviewed and reveals sinus rhythm with LVH without acute ST segment changes but  with mild T wave abnormality laterally and anterolaterally.  Laboratory revealed potassium initially at 2.9 on repeat 3.3.  Magnesium 1.5.  Elevated transaminases with AST 101 ALT 77.  I will long discussion with the patient's family and patient.  Presently he has stable hemodynamics without acute ST-T changes.  He is not having any chest pain.  We will allow time for recovery of neurologic function and alertness.  There is obvious concern for ischemic etiology of his cardiac arrest successfully defibrillated x1 with return of ROSC.  I have discussed definitive cardiac catheterization.  At present we will place the patient on  the schedule for Monday morning plans for possible PCI if indicated.  I discussed the risk benefits of the procedure.  My colleagues will see patient over the weekend for follow-up evaluations.  Troy Sine, MD, [facility] 09/17/2020 1:33 PM

## 2020-09-17 NOTE — Progress Notes (Signed)
NAME:  Brian Robinson MRN:  [id number] DOB:  Aug 13, 1945 LOS: 1 ADMISSION DATE:  09/16/2020 CONSULTATION DATE:  09/16/2020 REFERRING MD:  Ronnald Nian - EDP CHIEF COMPLAINT:  Cardiac arrest   History of Present Illness:  75 year old man with PMHx significant for HTN, T2DM and ?palpitations who presented to [facility] ED via EMS after witness cardiac arrest at home.  Patient reportedly was in his usual state of health, but was under some additional stress due to a bill he received in the mail on day on admission. Shortly after this, patient was standing next to wife and collapsed. Patient's wife contacted EMS. On Fire Department arrival, patient was pulseless and CPR was started within 5-10 minutes of arrest. Patient had a shockable rhythm and was shocked x 1 with ROSC; had pulses on EMS arrival but remained obtunded. Intubated on arrival to ED.  On presentation to [facility] ED, patient was hypothermic ~83F, hypertensive to 204/103 with normal HR and RR. O2 sat was 100% on ventilator. Labs were notable for WBC 12.9, H&H 13.3/38.9, Plt 211. Na 136, K 3.6, CO2 30, CUN 21, Cr 1.35. AST/ALT 122 and , lipase 54. Initial troponin 155. CT Head NAICA, demonstrating only chronic atrophic/ischemic changes. CT C-spine negative for acute injury.   PCCM consulted for admission.  Pertinent Medical History:   Past Medical History:  Diagnosis Date   Diabetes mellitus without complication ([place])    Hypertension    Significant Hospital Events: Including procedures, antibiotic start and stop dates in addition to other pertinent events   6/23 - BIB EMS after witnessed cardiac arrest, pulseless. CPR administered by Fire with shockable rhythm with Fire Dept on scene, shocked x 1. Pulses present on EMS arrival. Obtunded. Intubated in ED. Admitted to ICU.  Interim History / Subjective:  Woke up this am, brady with propofol, weaned off.  Objective:  Blood pressure 131/66, pulse 74, temperature 99.14 F (37.3 C), resp. rate (!) 7,  height 5\' 6"  (1.676 m), weight 86.2 kg, SpO2 100 %.    Vent Mode: PRVC FiO2 (%):  [40 %-100 %] 40 % Set Rate:  [16 bmp-18 bmp] 18 bmp Vt Set:  [510 mL-550 mL] 510 mL PEEP:  [5 cmH20] 5 cmH20 Plateau Pressure:  [17 cmH20-18 cmH20] 18 cmH20   Intake/Output Summary (Last 24 hours) at 09/17/2020 0747 Last data filed at 09/17/2020 0631 Gross per 24 hour  Intake 76.85 ml  Output 605 ml  Net -528.15 ml   Filed Weights   09/16/20 2200 09/17/20 0500  Weight: 86.2 kg 86.2 kg    Physical Examination: No acute distress Lungs clear Briskly following commands Tele with ? ST elevation and depression  Labs/imaging that I have personally reviewed: (right click and "Reselect all SmartList Selections" daily)  WBC 12.9, H&H 13.3/38.9, Plt 211  Na 136, K 3.6, CO2 30, CUN 21, Cr 1.35 AST/ALT 122 and , lipase 54  Initial troponin 155  CXR 6/23 IMPRESSION: 1. Endotracheal tube above the carina. 2. Mild cardiomegaly with mild vascular congestion. No focal consolidation.  CT Head 6/23 IMPRESSION: CT of the head: No acute intracranial abnormality noted. Chronic atrophic and ischemic changes are seen.   CT C-Spine 6/23 IMPRESSION: CT of the cervical spine: Multilevel degenerative change without acute abnormality.  CTA Chest 6/23 IMPRESSION: No aortic dissection or aneurysmal dilatation. No large central pulmonary embolus is noted. Multiple anterior rib fractures are noted bilaterally consistent with the recent CPR. Gastric catheter and endotracheal tube in satisfactory position. Changes of chronic granulomatous disease.  Aortic atherosclerosis  Resolved Hospital Problem List:     Assessment & Plan:  Cardiac arrest, unknown etiology- some question of seizure like activity but suspect this was forward flow issue, no signs of this on EEG Post arrest encephalopathy- improved, following commands Resp failure in context of above, bilateral rib fractures from CPR Troponin leak,  abnormal tele- stat repeat EKG, trop, echo ordered Hypokalemia, hypomagnesemia- repleting  - f/u EKG, trop, echo; replete electrolytes - ASA x 1 and daily - Heparin gtt for now - Wean to extubate - PT/OT/SLP/IS - Family updated at bedside  37 minutes CC time not including any separately billable procedures

## 2020-09-17 NOTE — Progress Notes (Signed)
ANTICOAGULATION CONSULT NOTE - Initial Consult  Pharmacy Consult for Heparin Indication: chest pain/ACS  No Known Allergies  Patient Measurements: Height: 5\' 6"  (167.6 cm) Weight: 86.2 kg (190 lb 0.6 oz) IBW/kg (Calculated) : 63.8 Heparin Dosing Weight: 81.7 kg  Vital Signs: Temp: 99.14 F (37.3 C) (06/24 0700) BP: 131/66 (06/24 0700) Pulse Rate: 74 (06/24 0700)  Labs: Recent Labs    09/16/20 1726 09/16/20 1731 09/16/20 1742 09/16/20 1927 09/16/20 2009 09/16/20 2103 09/16/20 2238 09/17/20 0153 09/17/20 0336  HGB 13.3 13.3   < >  --  12.4* 10.2*  --  11.1* 11.6*  HCT 38.9* 39.0   < >  --  36.0* 30.0*  --  32.2* 34.0*  PLT 211  --   --   --  174  --   --  162  --   LABPROT 13.5  --   --   --   --   --   --   --   --   INR 1.0  --   --   --   --   --   --   --   --   CREATININE 1.35* 1.40*  --   --   --   --  1.20 1.09  --   TROPONINIHS 155*  --   --  1,177*  --   --   --   --   --    < > = values in this interval not displayed.    Estimated Creatinine Clearance: 59.4 mL/min (by C-G formula based on SCr of 1.09 mg/dL).   Medical History: Past Medical History:  Diagnosis Date   Diabetes mellitus without complication ([place])    Hypertension      Assessment: 75 yo male presents with out-of-hospital cardiac arrest. Troponins jumped from 155 to 1177. Pharmacy asked to start heparin for possible ACS. No anticoagulation noted PTA. Last dose of SQ heparin for DVT PPX on 6/24 at 0511. CBC stable, Hgb 11.1, pltc 162. No overt bleeding noted.   Goal of Therapy:  Heparin level 0.3-0.7 units/ml Monitor platelets by anticoagulation protocol: Yes   Plan:  Start heparin infusion at 1100 units/hr Check 6hr HL Monitor daily HL, CBC, s/sx bleeding  Richardine Service, PharmD, BCPS PGY2 Cardiology Pharmacy Resident Phone: [phone] 09/17/2020  7:58 AM  Please check [email or web address] for unit-specific pharmacy phone numbers.

## 2020-09-18 DIAGNOSIS — I469 Cardiac arrest, cause unspecified: Secondary | ICD-10-CM

## 2020-09-18 LAB — CBC
HCT: 32.6 % — ABNORMAL LOW (ref 39.0–52.0)
Hemoglobin: 10.9 g/dL — ABNORMAL LOW (ref 13.0–17.0)
MCH: 28.5 pg (ref 26.0–34.0)
MCHC: 33.4 g/dL (ref 30.0–36.0)
MCV: 85.3 fL (ref 80.0–100.0)
Platelets: 149 10*3/uL — ABNORMAL LOW (ref 150–400)
RBC: 3.82 MIL/uL — ABNORMAL LOW (ref 4.22–5.81)
RDW: 13.6 % (ref 11.5–15.5)
WBC: 12.3 10*3/uL — ABNORMAL HIGH (ref 4.0–10.5)
nRBC: 0 % (ref 0.0–0.2)

## 2020-09-18 LAB — GLUCOSE, CAPILLARY
Glucose-Capillary: 127 mg/dL — ABNORMAL HIGH (ref 70–99)
Glucose-Capillary: 136 mg/dL — ABNORMAL HIGH (ref 70–99)

## 2020-09-18 LAB — LIPID PANEL
Cholesterol: 141 mg/dL (ref 0–200)
HDL: 40 mg/dL — ABNORMAL LOW (ref >40)
LDL Cholesterol: 82 mg/dL (ref 0–99)
Total CHOL/HDL Ratio: 3.5 RATIO
Triglycerides: 96 mg/dL (ref <150)
VLDL: 19 mg/dL (ref 0–40)

## 2020-09-18 LAB — COMPREHENSIVE METABOLIC PANEL
ALT: 55 U/L — ABNORMAL HIGH (ref 0–44)
AST: 68 U/L — ABNORMAL HIGH (ref 15–41)
Albumin: 3.4 g/dL — ABNORMAL LOW (ref 3.5–5.0)
Alkaline Phosphatase: 56 U/L (ref 38–126)
Anion gap: 7 (ref 5–15)
BUN: 22 mg/dL (ref 8–23)
CO2: 32 mmol/L (ref 22–32)
Calcium: 8.9 mg/dL (ref 8.9–10.3)
Chloride: 99 mmol/L (ref 98–111)
Creatinine, Ser: 0.88 mg/dL (ref 0.61–1.24)
GFR, Estimated: >60 mL/min (ref >60)
Glucose, Bld: 118 mg/dL — ABNORMAL HIGH (ref 70–99)
Potassium: 3.3 mmol/L — ABNORMAL LOW (ref 3.5–5.1)
Sodium: 138 mmol/L (ref 135–145)
Total Bilirubin: 1 mg/dL (ref 0.3–1.2)
Total Protein: 6.7 g/dL (ref 6.5–8.1)

## 2020-09-18 LAB — HEPARIN LEVEL (UNFRACTIONATED)
Heparin Unfractionated: 0.64 IU/mL (ref 0.30–0.70)
Heparin Unfractionated: <0.1 IU/mL — ABNORMAL LOW (ref 0.30–0.70)

## 2020-09-18 LAB — HEMOGLOBIN A1C
Hgb A1c MFr Bld: 5.4 % (ref 4.8–5.6)
Mean Plasma Glucose: 108.28 mg/dL

## 2020-09-18 LAB — MAGNESIUM: Magnesium: 2.3 mg/dL (ref 1.7–2.4)

## 2020-09-18 LAB — PHOSPHORUS: Phosphorus: 3.6 mg/dL (ref 2.5–4.6)

## 2020-09-18 MED ORDER — ATORVASTATIN CALCIUM 10 MG PO TABS: 20.0000 mg | ORAL_TABLET | Freq: Every day | ORAL | Status: DC

## 2020-09-18 MED ORDER — POTASSIUM CHLORIDE 10 MEQ/100ML IV SOLN
10.0000 meq | INTRAVENOUS | Status: AC
  Administered 2020-09-18 (×4): 10 meq via INTRAVENOUS
  Filled 2020-09-18 (×4): qty 100

## 2020-09-18 MED ORDER — POTASSIUM CHLORIDE CRYS ER 20 MEQ PO TBCR
20.0000 meq | EXTENDED_RELEASE_TABLET | ORAL | Status: AC
  Administered 2020-09-18 (×2): 20 meq via ORAL
  Filled 2020-09-18 (×2): qty 1

## 2020-09-18 MED ORDER — ACETAMINOPHEN 500 MG PO TABS
500.0000 mg | ORAL_TABLET | Freq: Once | ORAL | Status: AC | PRN
  Administered 2020-09-18: 500 mg via ORAL
  Filled 2020-09-18: qty 1

## 2020-09-18 MED ORDER — DIPHENHYDRAMINE HCL 25 MG PO CAPS
25.0000 mg | ORAL_CAPSULE | Freq: Four times a day (QID) | ORAL | Status: DC | PRN
  Administered 2020-09-21 – 2020-09-22 (×2): 25 mg via ORAL
  Filled 2020-09-18 (×2): qty 1

## 2020-09-18 MED ORDER — ATORVASTATIN CALCIUM 10 MG PO TABS
10.0000 mg | ORAL_TABLET | Freq: Every day | ORAL | Status: DC
  Administered 2020-09-18: 10 mg via ORAL
  Filled 2020-09-18: qty 1

## 2020-09-18 MED ORDER — AMLODIPINE BESYLATE 5 MG PO TABS
5.0000 mg | ORAL_TABLET | Freq: Every day | ORAL | Status: DC
  Administered 2020-09-18 – 2020-09-22 (×5): 5 mg via ORAL
  Filled 2020-09-18 (×5): qty 1

## 2020-09-18 MED ORDER — SERTRALINE HCL 50 MG PO TABS
50.0000 mg | ORAL_TABLET | Freq: Every day | ORAL | Status: DC
  Administered 2020-09-18 – 2020-09-28 (×10): 50 mg via ORAL
  Filled 2020-09-18 (×10): qty 1

## 2020-09-18 NOTE — Progress Notes (Signed)
ANTICOAGULATION CONSULT NOTE  Pharmacy Consult for Heparin Indication: chest pain/ACS  No Known Allergies  Patient Measurements: Height: 5\' 6"  (167.6 cm) Weight: 83.5 kg (184 lb 1.4 oz) IBW/kg (Calculated) : 63.8 Heparin Dosing Weight: 81.7 kg  Vital Signs: Temp: 99 F (37.2 C) (06/25 1142) Temp Source: Oral (06/25 1142) BP: 99/58 (06/25 1208) Pulse Rate: 80 (06/25 1208)  Labs: Recent Labs    09/16/20 1726 09/16/20 1731 09/16/20 1927 09/16/20 2009 09/16/20 2103 09/16/20 2238 09/17/20 0153 09/17/20 0336 09/17/20 0829 09/17/20 1059 09/17/20 1516 09/18/20 0023 09/18/20 1649  HGB 13.3   < >  --  12.4*   < >  --  11.1* 11.6*  --   --   --  10.9*  --   HCT 38.9*   < >  --  36.0*   < >  --  32.2* 34.0*  --   --   --  32.6*  --   PLT 211  --   --  174  --   --  162  --   --   --   --  149*  --   LABPROT 13.5  --   --   --   --   --   --   --   --   --   --   --   --   INR 1.0  --   --   --   --   --   --   --   --   --   --   --   --   HEPARINUNFRC  --   --   --   --   --   --   --   --   --   --  0.51 0.64 <0.10*  CREATININE 1.35*   < >  --   --   --  1.20 1.09  --   --   --   --  0.88  --   TROPONINIHS 155*  --  1,177*  --   --   --   --   --  831* 665*  --   --   --    < > = values in this interval not displayed.     Estimated Creatinine Clearance: 72.4 mL/min (by C-G formula based on SCr of 0.88 mg/dL).   Assessment: 75 yo male presents with out-of-hospital cardiac arrest. Troponins jumped from 155 to 1177. Pharmacy asked to dose heparin for possible ACS.  No anticoagulation noted PTA. Last dose of SQ heparin for DVT PPX on 6/24 at 0511. CBC stable, Hgb 11.1, pltc 162. No overt bleeding noted.   Heparin level undetectable this evening after being at goal x2 and small (50 units/hr) rate adjustment earlier today. Rn reports IV backing up, she will flush and ensure infusion is running appropriately. Will not make an adjustment at this time and recheck with am labs.    Goal of Therapy:  Heparin level 0.3-0.7 units/ml Monitor platelets by anticoagulation protocol: Yes   Plan:  Continue heparin infusion at 1050 units/hr Monitor daily HL, CBC, s/sx bleeding  Erin Hearing PharmD., BCPS Clinical Pharmacist 09/18/2020 6:27 PM

## 2020-09-18 NOTE — Evaluation (Signed)
Physical Therapy Evaluation Patient Details Name: Brian Robinson MRN: [id number] DOB: 1945-10-28 Today's Date: 09/18/2020   History of Present Illness  75y/o white male that presented to the ER 09/16/20 from home s/p cardiac arrest.  The pt was standing next to his wife when he slumped to the floor in cardiac arrest. CPR was performed by the wife. Intubated on arrival to ED. EEG shows nonspecific encephalopathy. bil anterior rib fractures s/p CPR; extubated 6/24; cardiology consult +NSTEMI   PMH HTN, DM  Clinical Impression   Pt admitted secondary to problem above with deficits below. PTA patient was independent with all mobility. Pt currently requires up to mod assist for ambulation (likely pt became orthostatic as he became quite pale as his balance deteriorated while walking). He began drifting to his right (after walking ~100 ft without drift or rt lean) and ultimately a chair was brought for him to sit in and was rolled back to his room where his BP was 99/58 sitting. RN made aware.  Anticipate patient will benefit from PT to address problems listed below.Will continue to follow acutely to maximize functional mobility independence and safety.       Follow Up Recommendations Home health PT;Supervision/Assistance - 24 hour (may progress to no PT needs;)    Equipment Recommendations  Other (comment) (TBA; anticipate none as BP stabilizes)    Recommendations for Other Services OT consult     Precautions / Restrictions Precautions Precautions: Fall;Other (comment) Precaution Comments: ?orthostatic; monitor Restrictions Weight Bearing Restrictions: No      Mobility  Bed Mobility Overal bed mobility: Needs Assistance Bed Mobility: Rolling;Sidelying to Sit Rolling: Supervision Sidelying to sit: Min guard       General bed mobility comments: cues to roll to side and then up to reduce rib pain; near need for assist, closeguarding    Transfers Overall transfer level: Needs  assistance Equipment used: None Transfers: Sit to/from Stand Sit to Stand: Min guard         General transfer comment: no unsteadiness and denied dizziness  Ambulation/Gait Ambulation/Gait assistance: Min assist Gait Distance (Feet): 120 Feet Assistive device: None Gait Pattern/deviations: Step-through pattern;Drifts right/left;Staggering right Gait velocity: WNL   General Gait Details: after ~100 ft pt with drift to right and ultimately staggering to his right; noted pallor despite pt insisting he was fine and brought pt chair to sit in; brought wheelchair to ride back to his room  Stairs            Wheelchair Mobility    Modified Rankin (Stroke Patients Only)       Balance Overall balance assessment: Needs assistance   Sitting balance-Leahy Scale: Good       Standing balance-Leahy Scale: Fair                               Pertinent Vitals/Pain Pain Assessment: Faces Faces Pain Scale: Hurts little more Pain Location: ribs/chest Pain Descriptors / Indicators: Discomfort;Grimacing Pain Intervention(s): Limited activity within patient's tolerance;Monitored during session;Other (comment) (educated to roll side and then side to sit)    [place] expects to be discharged to:: Private residence Living Arrangements: Spouse/significant other Available Help at Discharge: Family;Available 24 hours/day Type of Home: House Home Access: Stairs to enter Entrance Stairs-Rails: Right Entrance Stairs-Number of Steps: 4 Home Layout: Two level;Able to live on main level with bedroom/bathroom Home Equipment: Kasandra Knudsen - single point;Shower seat - built in (wooden cane was his  grandfather's)      Prior Function Level of Independence: Independent         Comments: denies imbalance problems     Hand Dominance        Extremity/Trunk Assessment   Upper Extremity Assessment Upper Extremity Assessment: Defer to OT evaluation    Lower  Extremity Assessment Lower Extremity Assessment: Overall WFL for tasks assessed    Cervical / Trunk Assessment Cervical / Trunk Assessment: Normal  Communication   Communication: No difficulties  Cognition Arousal/Alertness: Awake/alert Behavior During Therapy: Flat affect Overall Cognitive Status: Impaired/Different from baseline Area of Impairment: Attention;Safety/judgement;Awareness;Problem solving                   Current Attention Level: Sustained     Safety/Judgement: Decreased awareness of safety;Decreased awareness of deficits Awareness: Intellectual Problem Solving: Slow processing;Difficulty sequencing;Requires verbal cues;Requires tactile cues General Comments: unaware of decreasing balance as he walked farther and denied need to sit (despite pallor and unsteady); poor awareness of IV tubing and which way to turn      General Comments General comments (skin integrity, edema, etc.): Upon sitting in chair for recovery from imbalance and palor while walking, HR 92, sats 93% on RA. On return to his room, BP in sitting 99/58 (had been sitting in wheelchair for several minutes by the time able to get a BP). RN reports earlier BP 140s.    Exercises     Assessment/Plan    PT Assessment Patient needs continued PT services  PT Problem List Decreased activity tolerance;Decreased balance;Decreased mobility;Decreased cognition;Decreased knowledge of use of DME;Decreased safety awareness;Decreased knowledge of precautions;Cardiopulmonary status limiting activity       PT Treatment Interventions DME instruction;Gait training;Stair training;Functional mobility training;Therapeutic activities;Therapeutic exercise;Balance training;Cognitive remediation;Patient/family education    PT Goals (Current goals can be found in the Care Plan section)  Acute Rehab PT Goals Patient Stated Goal: wants to walk and go home PT Goal Formulation: With patient Time For Goal Achievement:  10/02/20 Potential to Achieve Goals: Good    Frequency Min 3X/week   Barriers to discharge        Co-evaluation               AM-PAC PT "6 Clicks" Mobility  Outcome Measure Help needed turning from your back to your side while in a flat bed without using bedrails?: None Help needed moving from lying on your back to sitting on the side of a flat bed without using bedrails?: A Little Help needed moving to and from a bed to a chair (including a wheelchair)?: A Little Help needed standing up from a chair using your arms (e.g., wheelchair or bedside chair)?: A Little Help needed to walk in hospital room?: A Little Help needed climbing 3-5 steps with a railing? : Total 6 Click Score: 17    End of Session Equipment Utilized During Treatment: Gait belt Activity Tolerance: Treatment limited secondary to medical complications (Comment) (likely orthostasis) Patient left: in chair;with call bell/phone within reach;with chair alarm set;with family/visitor present Nurse Communication: Mobility status;Other (comment) (unaware of his symptoms and nearly fell due to ?orthostasis) PT Visit Diagnosis: Unsteadiness on feet (R26.81);Difficulty in walking, not elsewhere classified (R26.2)    Time: 5366-4403 PT Time Calculation (min) (ACUTE ONLY): 28 min   Charges:   PT Evaluation $PT Eval Moderate Complexity: 1 Mod PT Treatments $Gait Training: 8-22 mins         Arby Barrette, PT Pager [phone]   Rexanne Mano 09/18/2020,  12:44 PM

## 2020-09-18 NOTE — Progress Notes (Signed)
OT Cancellation Note  Patient Details Name: Brian Robinson MRN: [id number] DOB: 10/02/1945   Cancelled Treatment:    Reason Eval/Treat Not Completed: Other (comment). Pt transferred form ICU, then working with PT. Discussed with PT and pt had near syncopal event. Will follow up later time.   Ramond Dial, OT/L   Acute OT Clinical Specialist Acute Rehabilitation Services Pager [phone] Office [phone]  09/18/2020, 2:33 PM

## 2020-09-18 NOTE — Progress Notes (Signed)
eLink Physician-Brief Progress Note Patient Name: Brian Robinson DOB: November 25, 1945 MRN: [id number]   Date of Service  09/18/2020  HPI/Events of Note  Bed side RN  asking for tylenol for pain.   last ast/alt 68/55.  takes po fine  eICU Interventions  Tylenol ordered. S/p extubation. PCI -LHC tomorrow. Rib #s from CPR     Intervention Category Intermediate Interventions: Pain - evaluation and management  Elmer Sow 09/18/2020, 8:04 PM

## 2020-09-18 NOTE — Plan of Care (Signed)
  Problem: Elimination: Goal: Will not experience complications related to urinary retention Outcome: Completed/Met

## 2020-09-18 NOTE — Progress Notes (Signed)
NAME:  Brian Robinson MRN:  [id number] DOB:  25-May-1945 LOS: 2 ADMISSION DATE:  09/16/2020 CONSULTATION DATE:  09/16/2020 REFERRING MD:  Ronnald Nian - EDP CHIEF COMPLAINT:  Cardiac arrest   History of Present Illness:  75 year old man with PMHx significant for HTN, T2DM and ?palpitations who presented to [facility] ED via EMS after witness cardiac arrest at home.  Patient reportedly was in his usual state of health, but was under some additional stress due to a bill he received in the mail on day on admission. Shortly after this, patient was standing next to wife and collapsed. Patient's wife contacted EMS. On Fire Department arrival, patient was pulseless and CPR was started within 5-10 minutes of arrest. Patient had a shockable rhythm and was shocked x 1 with ROSC; had pulses on EMS arrival but remained obtunded. Intubated on arrival to ED.  On presentation to [facility] ED, patient was hypothermic ~49F, hypertensive to 204/103 with normal HR and RR. O2 sat was 100% on ventilator. Labs were notable for WBC 12.9, H&H 13.3/38.9, Plt 211. Na 136, K 3.6, CO2 30, CUN 21, Cr 1.35. AST/ALT 122 and , lipase 54. Initial troponin 155. CT Head NAICA, demonstrating only chronic atrophic/ischemic changes. CT C-spine negative for acute injury.   PCCM consulted for admission.  Pertinent Medical History:   Past Medical History:  Diagnosis Date   COVID-19    received Mab 01/2020   Diabetes mellitus without complication (HCC)    Heart murmur    Hypertension    Mild aortic stenosis    Significant Hospital Events: Including procedures, antibiotic start and stop dates in addition to other pertinent events   6/23 - BIB EMS after witnessed cardiac arrest, pulseless. CPR administered by Fire with shockable rhythm with Fire Dept on scene, shocked x 1. Pulses present on EMS arrival. Obtunded. Intubated in ED. Admitted to ICU. 6/24 extubated, a bit confused, cards consult  Interim History / Subjective:  No events, Mental status  improved Wants to go home Bps a bit high He is restless  Objective:  Blood pressure (!) 159/87, pulse 75, temperature 98.4 F (36.9 C), temperature source Oral, resp. rate 17, height 5\' 6"  (1.676 m), weight 83.5 kg, SpO2 100 %.        Intake/Output Summary (Last 24 hours) at 09/18/2020 0849 Last data filed at 09/18/2020 0700 Gross per 24 hour  Intake 454.94 ml  Output 823 ml  Net -368.06 ml    Filed Weights   09/17/20 0500 09/17/20 0700 09/18/20 0500  Weight: 86.2 kg 86.2 kg 83.5 kg    Physical Examination: No distress Mentating well Moving all 4 ext Heart sounds and lung clear  Labs/imaging that I have personally reviewed: (right click and "Reselect all SmartList Selections" daily)  K low: repleting Renal/liver indices improved CBC stable  Resolved Hospital Problem List:     Assessment & Plan:  Cardiac arrest, unknown etiology- some question of seizure like activity but suspect this was forward flow issue, no signs of this on EEG Post arrest encephalopathy- improved, following commands Resp failure in context of above, bilateral rib fractures from CPR Troponin leak- plan for [facility] Monday Hypokalemia- repleting  - OOB to chair, IS - PT consult - Resume home statin and sertraline; LDL looks good - Replete K - Heparin gtt, ASA - Amlodipine for BP, hesitate to add back beta blocker at present given issues with bradycardia yesterday - Plan for [facility] Monday, if neg for lesions probably needs home event monitor; appreciate cardiology  input - Stable for tele, appreciate [facility] taking over starting 6/26 - Family and patient updated  Erskine Emery MD PCCM

## 2020-09-18 NOTE — Progress Notes (Signed)
Pt transferred to [facility] room 7.

## 2020-09-18 NOTE — Progress Notes (Signed)
Progress Note  Patient Name: Brian Robinson Date of Encounter: 09/18/2020  [facility] Cardiologist: None   Subjective   NAEO. Labwork stable. Mental status improving significantly. Playing solitare on ipad this AM.  Inpatient Medications    Scheduled Meds:  Chlorhexidine Gluconate Cloth  6 each Topical Daily   mouth rinse  15 mL Mouth Rinse BID   potassium chloride  20 mEq Oral Q4H   Continuous Infusions:  sodium chloride Stopped (09/18/20 0606)   heparin 1,100 Units/hr (09/18/20 0700)   potassium chloride 10 mEq (09/18/20 0736)   PRN Meds: sodium chloride, docusate sodium, labetalol, polyethylene glycol   Vital Signs    Vitals:   09/18/20 0630 09/18/20 0700 09/18/20 0753 09/18/20 0800  BP: (!) 173/87 (!) 151/83  (!) 159/87  Pulse: 80 92  75  Resp: 14 (!) 26  17  Temp:   98.4 F (36.9 C)   TempSrc:   Oral   SpO2: 99% 96%  100%  Weight:      Height:        Intake/Output Summary (Last 24 hours) at 09/18/2020 0825 Last data filed at 09/18/2020 0700 Gross per 24 hour  Intake 454.94 ml  Output 823 ml  Net -368.06 ml   Last 3 Weights 09/18/2020 09/17/2020 09/17/2020  Weight (lbs) 184 lb 1.4 oz 190 lb 0.6 oz 190 lb 0.6 oz  Weight (kg) 83.5 kg 86.2 kg 86.2 kg      Telemetry    Sinus rhythm - Personally Reviewed  ECG    No new - Personally Reviewed  Physical Exam   GEN: No acute distress.   Neck: No JVD Cardiac: RRR, II/VI murmur systolic at the L sternal borders. No rubs, or gallops.  Respiratory: Clear to auscultation bilaterally. GI: Soft, nontender, non-distended  MS: No edema; No deformity. Neuro:  Nonfocal  Psych: Normal affect   Labs    High Sensitivity Troponin:   Recent Labs  Lab 09/16/20 1726 09/16/20 1927 09/17/20 0829 09/17/20 1059  TROPONINIHS 155* 1,177* 831* 665*      Chemistry Recent Labs  Lab 09/16/20 1726 09/16/20 1731 09/16/20 2238 09/17/20 0153 09/17/20 0336 09/18/20 0023  NA 136   < > 134* 135 137 138  K 3.6    < > 3.3* 2.9* 3.3* 3.3*  CL 99   < > 91* 96*  --  99  CO2 30  --  31 28  --  32  GLUCOSE 122*   < > 153* 148*  --  118*  BUN 21   < > 23 22  --  22  CREATININE 1.35*   < > 1.20 1.09  --  0.88  CALCIUM 8.6*  --  9.0 8.9  --  8.9  PROT 7.5  --   --  6.4*  --  6.7  ALBUMIN 4.0  --   --  3.4*  --  3.4*  AST 122*  --   --  101*  --  68*  ALT 99*  --   --  71*  --  55*  ALKPHOS 76  --   --  64  --  56  BILITOT 0.9  --   --  1.2  --  1.0  GFRNONAA 54*  --  >60 >60  --  >60  ANIONGAP 7  --  12 11  --  7   < > = values in this interval not displayed.     Hematology Recent Labs  Lab 09/16/20 2009  09/16/20 2103 09/17/20 0153 09/17/20 0336 09/18/20 0023  WBC 17.7*  --  13.7*  --  12.3*  RBC 4.18*  --  3.83*  --  3.82*  HGB 12.4*   < > 11.1* 11.6* 10.9*  HCT 36.0*   < > 32.2* 34.0* 32.6*  MCV 86.1  --  84.1  --  85.3  MCH 29.7  --  29.0  --  28.5  MCHC 34.4  --  34.5  --  33.4  RDW 13.2  --  13.2  --  13.6  PLT 174  --  162  --  149*   < > = values in this interval not displayed.    BNPNo results for input(s): BNP, PROBNP in the last 168 hours.   DDimer No results for input(s): DDIMER in the last 168 hours.   Radiology    CT Head Wo Contrast  Result Date: 09/16/2020 CLINICAL DATA:  Altered mental status following cardiac arrest EXAM: CT HEAD WITHOUT CONTRAST CT CERVICAL SPINE WITHOUT CONTRAST TECHNIQUE: Multidetector CT imaging of the head and cervical spine was performed following the standard protocol without intravenous contrast. Multiplanar CT image reconstructions of the cervical spine were also generated. COMPARISON:  None. FINDINGS: CT HEAD FINDINGS Brain: Basal ganglia calcifications are identified. Mild atrophic changes and chronic white matter ischemic changes are seen. No findings to suggest acute hemorrhage, acute infarction or space-occupying mass lesion are seen. Vascular: No hyperdense vessel or unexpected calcification. Skull: Normal. Negative for fracture or focal  lesion. Sinuses/Orbits: No acute finding. Other: None. CT CERVICAL SPINE FINDINGS Alignment: Within normal limits. Skull base and vertebrae: 7 cervical segments are well visualized. Vertebral body height is well maintained. Multilevel facet hypertrophic changes are noted. Osteophytic changes are seen as well. Disc space narrowing is seen at multiple levels. No acute fracture or acute facet abnormality is noted. Soft tissues and spinal canal: Surrounding soft tissue structures are within normal limits. Endotracheal tube and gastric catheter are seen although there distal aspects are not visualized on this exam. Upper chest: Visualized lung apices appear within normal limits. Other: None IMPRESSION: CT of the head: No acute intracranial abnormality noted. Chronic atrophic and ischemic changes are seen. CT of the cervical spine: Multilevel degenerative change without acute abnormality. Electronically Signed   By: Inez Catalina M.D.   On: 09/16/2020 18:14   CT Cervical Spine Wo Contrast  Result Date: 09/16/2020 CLINICAL DATA:  Altered mental status following cardiac arrest EXAM: CT HEAD WITHOUT CONTRAST CT CERVICAL SPINE WITHOUT CONTRAST TECHNIQUE: Multidetector CT imaging of the head and cervical spine was performed following the standard protocol without intravenous contrast. Multiplanar CT image reconstructions of the cervical spine were also generated. COMPARISON:  None. FINDINGS: CT HEAD FINDINGS Brain: Basal ganglia calcifications are identified. Mild atrophic changes and chronic white matter ischemic changes are seen. No findings to suggest acute hemorrhage, acute infarction or space-occupying mass lesion are seen. Vascular: No hyperdense vessel or unexpected calcification. Skull: Normal. Negative for fracture or focal lesion. Sinuses/Orbits: No acute finding. Other: None. CT CERVICAL SPINE FINDINGS Alignment: Within normal limits. Skull base and vertebrae: 7 cervical segments are well visualized. Vertebral  body height is well maintained. Multilevel facet hypertrophic changes are noted. Osteophytic changes are seen as well. Disc space narrowing is seen at multiple levels. No acute fracture or acute facet abnormality is noted. Soft tissues and spinal canal: Surrounding soft tissue structures are within normal limits. Endotracheal tube and gastric catheter are seen although there distal aspects are  not visualized on this exam. Upper chest: Visualized lung apices appear within normal limits. Other: None IMPRESSION: CT of the head: No acute intracranial abnormality noted. Chronic atrophic and ischemic changes are seen. CT of the cervical spine: Multilevel degenerative change without acute abnormality. Electronically Signed   By: Inez Catalina M.D.   On: 09/16/2020 18:14   DG Chest Port 1 View  Result Date: 09/17/2020 CLINICAL DATA:  Endotracheal tube. EXAM: PORTABLE CHEST 1 VIEW COMPARISON:  CT angiography 09/16/2020, chest x-ray 09/16/2020. FINDINGS: Endotracheal tube approximately 3.5 cm above the carina. Enteric tube coursing below the hemidiaphragm with tip and side port collimated off view. The heart size and mediastinal contours are unchanged. Interval development of linear atelectasis within the left lower lung zone. No pulmonary edema. No pleural effusion. No pneumothorax. No acute osseous abnormality. IMPRESSION: 1. Left lower lung zone airspace opacity likely atelectasis. 2. Lines and tubes are stable. Electronically Signed   By: Iven Finn M.D.   On: 09/17/2020 04:56   DG Chest Portable 1 View  Result Date: 09/16/2020 CLINICAL DATA:  75 year male status post intubation. EXAM: PORTABLE CHEST 1 VIEW COMPARISON:  Chest radiograph dated 06/29/2011 FINDINGS: Endotracheal tube with tip approximately 4 cm above the carina. Enteric tube extends below the diaphragm with tip in the distal stomach. There is mild cardiomegaly with mild vascular congestion. No focal consolidation, pleural effusion, or  pneumothorax. Atherosclerotic calcification of the aorta. No acute osseous pathology. IMPRESSION: 1. Endotracheal tube above the carina. 2. Mild cardiomegaly with mild vascular congestion. No focal consolidation. Electronically Signed   By: Anner Crete M.D.   On: 09/16/2020 18:37   CT ANGIO CHEST AORTA W/CM & OR WO/CM  Result Date: 09/16/2020 CLINICAL DATA:  Chest and back pain, initial encounter EXAM: CT ANGIOGRAPHY CHEST WITH CONTRAST TECHNIQUE: Multidetector CT imaging of the chest was performed using the standard protocol during bolus administration of intravenous contrast. Multiplanar CT image reconstructions and MIPs were obtained to evaluate the vascular anatomy. CONTRAST:  2mL OMNIPAQUE IOHEXOL 350 MG/ML SOLN COMPARISON:  Chest x-ray from earlier in the same day. FINDINGS: Cardiovascular: Initial precontrast images were obtained and reveal atherosclerotic calcifications of the thoracic aorta. No aneurysmal dilatation is noted. Postcontrast images were then obtained which reveal no aneurysmal dilatation or evidence of dissection. Coronary calcifications are noted. No cardiac enlargement is seen. Pulmonary artery shows no large central pulmonary embolus although not timed for embolus evaluation. Mediastinum/Nodes: Thoracic inlet is within normal limits. No sizable hilar or mediastinal adenopathy is noted. A few calcified lymph nodes are seen consistent with prior granulomatous disease. Gastric catheter extends into the distal stomach. Endotracheal tube is seen well above the carina. Lungs/Pleura: Scattered calcified granulomas are noted. Mild posterior basilar atelectasis is seen bilaterally. Small effusions are seen as well. No focal confluent infiltrate or pneumothorax is noted. Upper Abdomen: Visualized upper abdomen demonstrates multiple calcified granulomas. No other focal abnormality is noted. Musculoskeletal: Degenerative changes of the thoracic spine are noted. Multiple anterior rib fractures  are noted bilaterally consistent with the given clinical history of recent CPR. Review of the MIP images confirms the above findings. IMPRESSION: No aortic dissection or aneurysmal dilatation. No large central pulmonary embolus is noted. Multiple anterior rib fractures are noted bilaterally consistent with the recent CPR. Gastric catheter and endotracheal tube in satisfactory position. Changes of chronic granulomatous disease. Aortic Atherosclerosis (ICD10-I70.0). Electronically Signed   By: Inez Catalina M.D.   On: 09/16/2020 21:01   ECHOCARDIOGRAM COMPLETE  Result Date: 09/17/2020  ECHOCARDIOGRAM REPORT   Patient Name:   TUCKER MINTER Date of Exam: 09/17/2020 Medical Rec #:  [id number]      Height:       66.0 in Accession #:    [id number]     Weight:       190.0 lb Date of Birth:  1946-02-04       BSA:          1.957 m Patient Age:    22 years       BP:           131/66 mmHg Patient Gender: M              HR:           67 bpm. Exam Location:  Inpatient Procedure: 2D Echo, 3D Echo, Cardiac Doppler, Color Doppler and Intracardiac            Opacification Agent STAT ECHO                             MODIFIED REPORT: This report was modified by Kirk Ruths MD on 09/17/2020 due to Change.  Indications:     I49.9* Cardiac arrhythmia, unspecified. Cardiac arrest  History:         Patient has no prior history of Echocardiogram examinations.                  Abnormal ECG; Arrythmias:Cardiac Arrest.  Sonographer:     Roseanna Rainbow RDCS Referring Phys:  [id number] Candee Furbish Diagnosing Phys: Kirk Ruths MD  Sonographer Comments: Technically difficult study due to poor echo windows, suboptimal apical window and suboptimal subcostal window. Image acquisition challenging due to patient body habitus and Image acquisition challenging due to respiratory motion. IMPRESSIONS  1. Mild hypokinesis of the distal septum with overall preserved LV function; calcified aortic valve with mild AS by doppler but visually looks moderate.   2. Left ventricular ejection fraction, by estimation, is 55 to 60%. The left ventricle has normal function. The left ventricle demonstrates regional wall motion abnormalities (see scoring diagram/findings for description). There is mild left ventricular  hypertrophy. Left ventricular diastolic parameters are consistent with Grade II diastolic dysfunction (pseudonormalization). Elevated left atrial pressure.  3. Right ventricular systolic function is normal. The right ventricular size is normal.  4. The mitral valve is normal in structure. Trivial mitral valve regurgitation. No evidence of mitral stenosis. Moderate mitral annular calcification.  5. The aortic valve is calcified. Aortic valve regurgitation is not visualized. Mild aortic valve stenosis.  6. The inferior vena cava is normal in size with greater than 50% respiratory variability, suggesting right atrial pressure of 3 mmHg. FINDINGS  Left Ventricle: Left ventricular ejection fraction, by estimation, is 55 to 60%. The left ventricle has normal function. The left ventricle demonstrates regional wall motion abnormalities. Definity contrast agent was given IV to delineate the left ventricular endocardial borders. The left ventricular internal cavity size was normal in size. There is mild left ventricular hypertrophy. Left ventricular diastolic parameters are consistent with Grade II diastolic dysfunction (pseudonormalization). Elevated left atrial pressure. Right Ventricle: The right ventricular size is normal. Right ventricular systolic function is normal. Left Atrium: Left atrial size was normal in size. Right Atrium: Right atrial size was normal in size. Pericardium: There is no evidence of pericardial effusion. Mitral Valve: The mitral valve is normal in structure. Moderate mitral annular calcification. Trivial mitral valve regurgitation. No evidence of mitral  valve stenosis. Tricuspid Valve: The tricuspid valve is normal in structure. Tricuspid valve  regurgitation is trivial. No evidence of tricuspid stenosis. Aortic Valve: The aortic valve is calcified. Aortic valve regurgitation is not visualized. Mild aortic stenosis is present. Aortic valve mean gradient measures 14.0 mmHg. Aortic valve peak gradient measures 24.9 mmHg. Aortic valve area, by VTI measures 1.88 cm. Pulmonic Valve: The pulmonic valve was normal in structure. Pulmonic valve regurgitation is not visualized. No evidence of pulmonic stenosis. Aorta: The aortic root is normal in size and structure. Venous: The inferior vena cava is normal in size with greater than 50% respiratory variability, suggesting right atrial pressure of 3 mmHg.  Additional Comments: Mild hypokinesis of the distal septum with overall preserved LV function; calcified aortic valve with mild AS by doppler but visually looks moderate.  LEFT VENTRICLE PLAX 2D LVIDd:         4.40 cm      Diastology LVIDs:         3.20 cm      LV e' medial:    3.38 cm/s LV PW:         1.30 cm      LV E/e' medial:  25.7 LV IVS:        1.30 cm      LV e' lateral:   4.82 cm/s LVOT diam:     2.20 cm      LV E/e' lateral: 18.0 LV SV:         97 LV SV Index:   50 LVOT Area:     3.80 cm  LV Volumes (MOD) LV vol d, MOD A2C: 54.1 ml LV vol d, MOD A4C: 119.0 ml LV vol s, MOD A2C: 26.2 ml LV vol s, MOD A4C: 55.2 ml LV SV MOD A2C:     27.9 ml LV SV MOD A4C:     119.0 ml LV SV MOD BP:      46.1 ml RIGHT VENTRICLE             IVC RV S prime:     10.50 cm/s  IVC diam: 2.00 cm TAPSE (M-mode): 2.4 cm LEFT ATRIUM             Index       RIGHT ATRIUM           Index LA diam:        4.60 cm 2.35 cm/m  RA Area:     13.10 cm LA Vol (A2C):   28.8 ml 14.72 ml/m RA Volume:   27.20 ml  13.90 ml/m LA Vol (A4C):   57.6 ml 29.44 ml/m LA Biplane Vol: 42.7 ml 21.82 ml/m  AORTIC VALVE AV Area (Vmax):    2.20 cm AV Area (Vmean):   1.98 cm AV Area (VTI):     1.88 cm AV Vmax:           249.33 cm/s AV Vmean:          173.000 cm/s AV VTI:            0.515 m AV Peak Grad:       24.9 mmHg AV Mean Grad:      14.0 mmHg LVOT Vmax:         144.50 cm/s LVOT Vmean:        90.200 cm/s LVOT VTI:          0.255 m LVOT/AV VTI ratio: 0.50  AORTA Ao Root diam: 3.60 cm Ao Asc diam:  3.70 cm MITRAL VALVE MV Area (PHT): 3.03 cm    SHUNTS MV Decel Time: 250 msec    Systemic VTI:  0.26 m MV E velocity: 87.00 cm/s  Systemic Diam: 2.20 cm MV A velocity: 57.60 cm/s MV E/A ratio:  1.51 Kirk Ruths MD Electronically signed by Kirk Ruths MD Signature Date/Time: 09/17/2020/11:05:43 AM    Final (Updated)     Cardiac Studies   No new   Assessment & Plan  Mr Canela is a 75yo man with HTN, DM who presented after witnessed OHCA. Successfully defib x 1 by EMS with ROSC. Now HD stable with improving mental status. Admission labs suggested NSTEMI.   #OHCA, probable VT/VF EF normal Plan for [facility] Monday. If no significant CAD, will need ICD prior to discharge. - keep NPO Sunday night - keep K>4, Mg>2  #NSTEMI - cont asa - holding statin until able to take oral medications  #Anoxic Encephalopathy Improving  #Junctional bradycardia Only when sedated. Tele shows sinus rhythm   For questions or updates, please contact [place] Please consult [email or web address] for contact info under        Signed, Vickie Epley, MD  09/18/2020, 8:25 AM

## 2020-09-18 NOTE — Progress Notes (Signed)
[facility] ADULT ICU REPLACEMENT PROTOCOL   The patient does apply for the [facility] Adult ICU Electrolyte Replacment Protocol based on the criteria listed below:   1. Is GFR >/= 30 ml/min? Yes.    Patient's GFR today is >60 2. Is SCr </= 2? Yes.   Patient's SCr is 0.88 ml/kg/hr 3. Did SCr increase >/= 0.5 in 24 hours? No. 4. Abnormal electrolyte(s):  K 3.3 5. Ordered repletion with: protocol 6. If a panic level lab has been reported, has the CCM MD in charge been notified? .   Physician:  Jani Files R Alven Alverio 09/18/2020 5:51 AM

## 2020-09-18 NOTE — Progress Notes (Signed)
ANTICOAGULATION CONSULT NOTE  Pharmacy Consult for Heparin Indication: chest pain/ACS  No Known Allergies  Patient Measurements: Height: 5\' 6"  (167.6 cm) Weight: 83.5 kg (184 lb 1.4 oz) IBW/kg (Calculated) : 63.8 Heparin Dosing Weight: 81.7 kg  Vital Signs: Temp: 98.4 F (36.9 C) (06/25 0753) Temp Source: Oral (06/25 0753) BP: 147/69 (06/25 0938) Pulse Rate: 76 (06/25 0900)  Labs: Recent Labs    09/16/20 1726 09/16/20 1731 09/16/20 1927 09/16/20 2009 09/16/20 2103 09/16/20 2238 09/17/20 0153 09/17/20 0336 09/17/20 0829 09/17/20 1059 09/17/20 1516 09/18/20 0023  HGB 13.3   < >  --  12.4*   < >  --  11.1* 11.6*  --   --   --  10.9*  HCT 38.9*   < >  --  36.0*   < >  --  32.2* 34.0*  --   --   --  32.6*  PLT 211  --   --  174  --   --  162  --   --   --   --  149*  LABPROT 13.5  --   --   --   --   --   --   --   --   --   --   --   INR 1.0  --   --   --   --   --   --   --   --   --   --   --   HEPARINUNFRC  --   --   --   --   --   --   --   --   --   --  0.51 0.64  CREATININE 1.35*   < >  --   --   --  1.20 1.09  --   --   --   --  0.88  TROPONINIHS 155*  --  1,177*  --   --   --   --   --  831* 665*  --   --    < > = values in this interval not displayed.     Estimated Creatinine Clearance: 72.4 mL/min (by C-G formula based on SCr of 0.88 mg/dL).   Assessment: 75 yo male presents with out-of-hospital cardiac arrest. Troponins jumped from 155 to 1177. Pharmacy asked to dose heparin for possible ACS.  No anticoagulation noted PTA. Last dose of SQ heparin for DVT PPX on 6/24 at 0511. CBC stable, Hgb 11.1, pltc 162. No overt bleeding noted.   Heparin level therapeutic; no bleeding reported.  Goal of Therapy:  Heparin level 0.3-0.7 units/ml Monitor platelets by anticoagulation protocol: Yes   Plan:  Decrease heparin infusion at 1050 units/hr Recheck HL in 6 hours Monitor daily HL, CBC, s/sx bleeding  Alanda Slim, PharmD, [facility] Clinical Pharmacist Please  see AMION for all Pharmacists' Contact Phone Numbers 09/18/2020, 10:07 AM

## 2020-09-19 ENCOUNTER — Inpatient Hospital Stay (HOSPITAL_COMMUNITY): Payer: Medicare Other

## 2020-09-19 LAB — COMPREHENSIVE METABOLIC PANEL
ALT: 42 U/L (ref 0–44)
AST: 80 U/L — ABNORMAL HIGH (ref 15–41)
Albumin: 3.1 g/dL — ABNORMAL LOW (ref 3.5–5.0)
Alkaline Phosphatase: 54 U/L (ref 38–126)
Anion gap: 10 (ref 5–15)
BUN: 26 mg/dL — ABNORMAL HIGH (ref 8–23)
CO2: 29 mmol/L (ref 22–32)
Calcium: 8.8 mg/dL — ABNORMAL LOW (ref 8.9–10.3)
Chloride: 96 mmol/L — ABNORMAL LOW (ref 98–111)
Creatinine, Ser: 0.92 mg/dL (ref 0.61–1.24)
GFR, Estimated: >60 mL/min (ref >60)
Glucose, Bld: 139 mg/dL — ABNORMAL HIGH (ref 70–99)
Potassium: 3.8 mmol/L (ref 3.5–5.1)
Sodium: 135 mmol/L (ref 135–145)
Total Bilirubin: 1.1 mg/dL (ref 0.3–1.2)
Total Protein: 6.7 g/dL (ref 6.5–8.1)

## 2020-09-19 LAB — CBC
HCT: 25.8 % — ABNORMAL LOW (ref 39.0–52.0)
Hemoglobin: 8.9 g/dL — ABNORMAL LOW (ref 13.0–17.0)
MCH: 29.9 pg (ref 26.0–34.0)
MCHC: 34.5 g/dL (ref 30.0–36.0)
MCV: 86.6 fL (ref 80.0–100.0)
Platelets: 152 10*3/uL (ref 150–400)
RBC: 2.98 MIL/uL — ABNORMAL LOW (ref 4.22–5.81)
RDW: 13.3 % (ref 11.5–15.5)
WBC: 14.5 10*3/uL — ABNORMAL HIGH (ref 4.0–10.5)
nRBC: 0 % (ref 0.0–0.2)

## 2020-09-19 LAB — HEPARIN LEVEL (UNFRACTIONATED)
Heparin Unfractionated: 0.17 IU/mL — ABNORMAL LOW (ref 0.30–0.70)
Heparin Unfractionated: 0.26 IU/mL — ABNORMAL LOW (ref 0.30–0.70)
Heparin Unfractionated: 0.22 IU/mL — ABNORMAL LOW (ref 0.30–0.70)

## 2020-09-19 LAB — MAGNESIUM: Magnesium: 1.9 mg/dL (ref 1.7–2.4)

## 2020-09-19 LAB — PHOSPHORUS: Phosphorus: 2.8 mg/dL (ref 2.5–4.6)

## 2020-09-19 MED ORDER — MAGNESIUM SULFATE IN D5W 1-5 GM/100ML-% IV SOLN
1.0000 g | Freq: Once | INTRAVENOUS | Status: AC
  Administered 2020-09-19: 1 g via INTRAVENOUS
  Filled 2020-09-19: qty 100

## 2020-09-19 MED ORDER — POTASSIUM CHLORIDE 20 MEQ PO PACK
20.0000 meq | PACK | Freq: Once | ORAL | Status: AC
  Administered 2020-09-19: 20 meq via ORAL
  Filled 2020-09-19: qty 1

## 2020-09-19 MED ORDER — HYDROCODONE-ACETAMINOPHEN 7.5-325 MG PO TABS
1.0000 | ORAL_TABLET | Freq: Four times a day (QID) | ORAL | Status: DC | PRN
Start: 2020-09-19 — End: 2020-09-23
  Administered 2020-09-20 (×2): 1 via ORAL
  Filled 2020-09-19 (×2): qty 1

## 2020-09-19 MED ORDER — LIDOCAINE 5 % EX PTCH
1.0000 | MEDICATED_PATCH | CUTANEOUS | Status: DC
  Administered 2020-09-19 – 2020-09-22 (×4): 1 via TRANSDERMAL
  Filled 2020-09-19 (×5): qty 1

## 2020-09-19 MED ORDER — ROSUVASTATIN CALCIUM 20 MG PO TABS
20.0000 mg | ORAL_TABLET | Freq: Every day | ORAL | Status: DC
  Administered 2020-09-19 – 2020-09-26 (×7): 20 mg via ORAL
  Filled 2020-09-19 (×7): qty 1

## 2020-09-19 MED ORDER — MORPHINE SULFATE (PF) 2 MG/ML IV SOLN: 1.0000 mg | INTRAVENOUS | Status: DC | PRN

## 2020-09-19 NOTE — Progress Notes (Signed)
ANTICOAGULATION CONSULT NOTE - Follow Up Consult  Pharmacy Consult for Heparin Indication: NSTEMI  No Known Allergies  Patient Measurements: Height: 5\' 6"  (167.6 cm) Weight: 84.1 kg (185 lb 4.8 oz) IBW/kg (Calculated) : 63.8 Heparin Dosing Weight: 81.7 kg  Vital Signs: Temp: 100.2 F (37.9 C) (06/26 2035) Temp Source: Oral (06/26 2035) BP: 132/77 (06/26 2035) Pulse Rate: 93 (06/26 2035)  Labs: Recent Labs    09/17/20 0153 09/17/20 0336 09/17/20 0829 09/17/20 1059 09/17/20 1516 09/18/20 0023 09/18/20 1649 09/19/20 0344 09/19/20 1326 09/19/20 2155  HGB 11.1* 11.6*  --   --   --  10.9*  --  8.9*  --   --   HCT 32.2* 34.0*  --   --   --  32.6*  --  25.8*  --   --   PLT 162  --   --   --   --  149*  --  152  --   --   HEPARINUNFRC  --   --   --   --    < > 0.64   < > 0.17* 0.26* 0.22*  CREATININE 1.09  --   --   --   --  0.88  --  0.92  --   --   TROPONINIHS  --   --  831* 665*  --   --   --   --   --   --    < > = values in this interval not displayed.     Estimated Creatinine Clearance: 69.5 mL/min (by C-G formula based on SCr of 0.92 mg/dL).   Medications:  Scheduled:   amLODipine  5 mg Oral Daily   Chlorhexidine Gluconate Cloth  6 each Topical Daily   lidocaine  1 patch Transdermal Q24H   mouth rinse  15 mL Mouth Rinse BID   rosuvastatin  20 mg Oral Daily   sertraline  50 mg Oral Daily   Infusions:   sodium chloride Stopped (09/18/20 0606)   heparin 1,400 Units/hr (09/19/20 1521)    Assessment: 75 yo M s/p OOH cardiac arrest.  Pt continues on heparin for NSTEMI with plans for cardiac cath 6/27.  Heparin level continues to be subtherapeutic on heparin at 1400 units/hr. No bleeding noted.  Goal of Therapy:  Heparin level 0.3-0.7 units/ml Monitor platelets by anticoagulation protocol: Yes   Plan:  Increase heparin to 1600 units/hr. Repeat heparin level in am Heparin level and CBC daily while on heparin.  Erin Hearing PharmD., BCPS Clinical  Pharmacist 09/19/2020 10:38 PM

## 2020-09-19 NOTE — Progress Notes (Signed)
PROGRESS NOTE    Brian Robinson  XTK:[id number] DOB: Mar 17, 1946 DOA: 09/16/2020 PCP: Seward Carol, MD   Brief Narrative: 74 year old Caucasian male with a past medical history of hypertension, type 2 diabetes mellitus presented to the hospital after having out of spittle cardiac arrest at home.  EMS was contacted by me and the patient had 1 round of CPR.  With noted shockable rhythm and shock x1 with return of spontaneous circulation.  On EMS arrival to the emergency department he remained obtunded and therefore was intubated for airway protection.  He admitted to the ICU and extubated on 09/17/20.  Downgraded to telemetry yesterday.  Hospitalist team has assumed care starting 09/19/20.   Subjective: No acute events reported overnight.  Patient seen and examined with family at bedside.  Postarrest encephalopathy is improving but still having some occasional short-term memory loss per family.  No underlying dementia.  Denies any chest pain or heart palpitations.   Assessment & Plan:   Active Problems:   Cardiac arrest [facility])   Out of hospital cardiac arrest with concern for VT/VF -Echocardiogram showed EF that was normal.  Cardiology following in consultation. -Planned left heart cath on Monday. -NPO after midnight -If no significant coronary artery disease noted on cath that he will need an ICD prior to discharge  NSTEMI -Continue aspirin, statin and heparin drip  Postarrest encephalopathy -improving  Bilateral rib fractures from CPR -Continue ICS and will order lidocaine patches  Essential hypertension -Home amlodipine  Dyslipidemia -Continue statin  Depression -Continue home sertraline  DVT prophylaxis: Heparin drip Code Status: Full code Family Communication: Family was present at bedside upon my evaluation and all questions were answered Disposition:   Status is: Inpatient  Remains inpatient appropriate because:Ongoing diagnostic testing needed not appropriate  for outpatient work up  Dispo: The patient is from: Home              Anticipated d/c is to: Home              Patient currently is not medically stable to d/c.   Difficult to place patient No        Consultants:  Cardiology     Objective: Vitals:   09/19/20 0300 09/19/20 0529 09/19/20 0945 09/19/20 1134  BP:  127/70 136/80 134/75  Pulse:  83 81 81  Resp:  18 16 20   Temp:  98.4 F (36.9 C) 98.5 F (36.9 C) 97.8 F (36.6 C)  TempSrc:  Oral Oral Oral  SpO2:  94% 100% 97%  Weight: 84.1 kg     Height:        Intake/Output Summary (Last 24 hours) at 09/19/2020 1633 Last data filed at 09/19/2020 1500 Gross per 24 hour  Intake 686.95 ml  Output 401 ml  Net 285.95 ml   Filed Weights   09/17/20 0700 09/18/20 0500 09/19/20 0300  Weight: 86.2 kg 83.5 kg 84.1 kg    Examination:  General exam: Appears calm and comfortable  Respiratory system: Clear to auscultation. Respiratory effort normal. Cardiovascular system: S1 & S2 heard, RRR.  2 out of 6 systolic ejection murmur at the left upper sternal border Gastrointestinal system: Abdomen is nondistended, soft and nontender. No organomegaly or masses felt. Normal bowel sounds heard. Central nervous system: Alert and oriented. No focal neurological deficits. Extremities: Symmetric 5 x 5 power. Skin: No rashes, lesions or ulcers Psychiatry: Judgement and insight appear normal. Mood & affect appropriate.     Data Reviewed: I have personally reviewed following labs and  imaging studies  CBC: Recent Labs  Lab 09/16/20 1726 09/16/20 1731 09/16/20 2009 09/16/20 2103 09/17/20 0153 09/17/20 0336 09/18/20 0023 09/19/20 0344  WBC 12.9*  --  17.7*  --  13.7*  --  12.3* 14.5*  NEUTROABS 7.2  --   --   --   --   --   --   --   HGB 13.3   < > 12.4* 10.2* 11.1* 11.6* 10.9* 8.9*  HCT 38.9*   < > 36.0* 30.0* 32.2* 34.0* 32.6* 25.8*  MCV 86.1  --  86.1  --  84.1  --  85.3 86.6  PLT 211  --  174  --  162  --  149* 152   < > =  values in this interval not displayed.    Basic Metabolic Panel: Recent Labs  Lab 09/16/20 1726 09/16/20 1731 09/16/20 1742 09/16/20 2238 09/17/20 0153 09/17/20 0336 09/18/20 0023 09/19/20 0344  NA 136 138   < > 134* 135 137 138 135  K 3.6 3.5   < > 3.3* 2.9* 3.3* 3.3* 3.8  CL 99 97*  --  91* 96*  --  99 96*  CO2 30  --   --  31 28  --  32 29  GLUCOSE 122* 121*  --  153* 148*  --  118* 139*  BUN 21 23  --  23 22  --  22 26*  CREATININE 1.35* 1.40*  --  1.20 1.09  --  0.88 0.92  CALCIUM 8.6*  --   --  9.0 8.9  --  8.9 8.8*  MG  --   --   --  1.8 1.5*  --  2.3 1.9  PHOS  --   --   --   --  3.8  --  3.6 2.8   < > = values in this interval not displayed.    GFR: Estimated Creatinine Clearance: 69.5 mL/min (by C-G formula based on SCr of 0.92 mg/dL).  Liver Function Tests: Recent Labs  Lab 09/16/20 1726 09/17/20 0153 09/18/20 0023 09/19/20 0344  AST 122* 101* 68* 80*  ALT 99* 71* 55* 42  ALKPHOS 76 64 56 54  BILITOT 0.9 1.2 1.0 1.1  PROT 7.5 6.4* 6.7 6.7  ALBUMIN 4.0 3.4* 3.4* 3.1*    CBG: Recent Labs  Lab 09/17/20 1543 09/17/20 1932 09/17/20 2339 09/18/20 0321 09/18/20 0754  GLUCAP 138* 121* 129* 127* 136*     Recent Results (from the past 240 hour(s))  Resp Panel by RT-PCR (Flu A&B, Covid) Nasopharyngeal Swab     Status: None   Collection Time: 09/16/20  6:08 PM   Specimen: Nasopharyngeal Swab; Nasopharyngeal(NP) swabs in vial transport medium  Result Value Ref Range Status   SARS Coronavirus 2 by RT PCR NEGATIVE NEGATIVE Final    Comment: (NOTE) SARS-CoV-2 target nucleic acids are NOT DETECTED.  The SARS-CoV-2 RNA is generally detectable in upper respiratory specimens during the acute phase of infection. The lowest concentration of SARS-CoV-2 viral copies this assay can detect is 138 copies/mL. A negative result does not preclude SARS-Cov-2 infection and should not be used as the sole basis for treatment or other patient management decisions. A  negative result may occur with  improper specimen collection/handling, submission of specimen other than nasopharyngeal swab, presence of viral mutation(s) within the areas targeted by this assay, and inadequate number of viral copies(<138 copies/mL). A negative result must be combined with clinical observations, patient history, and epidemiological information. The expected  result is Negative.  Fact Sheet for Patients:  EntrepreneurPulse.com.au  Fact Sheet for Healthcare Providers:  IncredibleEmployment.be  This test is no t yet approved or cleared by the [place] FDA and  has been authorized for detection and/or diagnosis of SARS-CoV-2 by FDA under an Emergency Use Authorization (EUA). This EUA will remain  in effect (meaning this test can be used) for the duration of the COVID-19 declaration under Section 564(b)(1) of the Act, 21 U.S.C.section 360bbb-3(b)(1), unless the authorization is terminated  or revoked sooner.       Influenza A by PCR NEGATIVE NEGATIVE Final   Influenza B by PCR NEGATIVE NEGATIVE Final    Comment: (NOTE) The Xpert Xpress SARS-CoV-2/FLU/RSV plus assay is intended as an aid in the diagnosis of influenza from Nasopharyngeal swab specimens and should not be used as a sole basis for treatment. Nasal washings and aspirates are unacceptable for Xpert Xpress SARS-CoV-2/FLU/RSV testing.  Fact Sheet for Patients: EntrepreneurPulse.com.au  Fact Sheet for Healthcare Providers: IncredibleEmployment.be  This test is not yet approved or cleared by the [place] FDA and has been authorized for detection and/or diagnosis of SARS-CoV-2 by FDA under an Emergency Use Authorization (EUA). This EUA will remain in effect (meaning this test can be used) for the duration of the COVID-19 declaration under Section 564(b)(1) of the Act, 21 U.S.C. section 360bbb-3(b)(1), unless the authorization  is terminated or revoked.  Performed at [facility], [place] [address] [place], [place] [address]   MRSA Next Gen by PCR, Nasal     Status: None   Collection Time: 09/16/20  9:32 PM   Specimen: Nasal Mucosa; Nasal Swab  Result Value Ref Range Status   MRSA by PCR Next Gen NOT DETECTED NOT DETECTED Final    Comment: (NOTE) The GeneXpert MRSA Assay (FDA approved for NASAL specimens only), is one component of a comprehensive MRSA colonization surveillance program. It is not intended to diagnose MRSA infection nor to guide or monitor treatment for MRSA infections. Test performance is not FDA approved in patients less than 40 years old. Performed at [facility], [place] [address], [place],  [address]          Radiology Studies: No results found.      Scheduled Meds:  amLODipine  5 mg Oral Daily   Chlorhexidine Gluconate Cloth  6 each Topical Daily   lidocaine  1 patch Transdermal Q24H   mouth rinse  15 mL Mouth Rinse BID   rosuvastatin  20 mg Oral Daily   sertraline  50 mg Oral Daily   Continuous Infusions:  sodium chloride Stopped (09/18/20 0606)   heparin 1,400 Units/hr (09/19/20 1521)     LOS: 3 days    Time spent: 35 minutes    Leslee Home, MD [facility]   To contact the attending provider between 7A-7P or the covering provider during after hours 7P-7A, please log into the web site [email or web address] and access using universal [place] password for that web site. If you do not have the password, please call the hospital operator.  09/19/2020, 4:33 PM

## 2020-09-19 NOTE — Progress Notes (Signed)
Progress Note  Patient Name: Brian Robinson Date of Encounter: 09/19/2020  [facility] Cardiologist: None   Subjective   NAEO. Now on RNF.  Inpatient Medications    Scheduled Meds:  amLODipine  5 mg Oral Daily   atorvastatin  10 mg Oral QHS   Chlorhexidine Gluconate Cloth  6 each Topical Daily   mouth rinse  15 mL Mouth Rinse BID   sertraline  50 mg Oral Daily   Continuous Infusions:  sodium chloride Stopped (09/18/20 0606)   heparin 1,200 Units/hr (09/19/20 0601)   magnesium sulfate bolus IVPB 1 g (09/19/20 0935)   PRN Meds: sodium chloride, diphenhydrAMINE, docusate sodium, labetalol, polyethylene glycol   Vital Signs    Vitals:   09/18/20 2020 09/18/20 2357 09/19/20 0300 09/19/20 0529  BP: (!) 146/76 138/81  127/70  Pulse: 92 81  83  Resp: 18 18  18   Temp: 99.6 F (37.6 C) 98.8 F (37.1 C)  98.4 F (36.9 C)  TempSrc: Oral Oral  Oral  SpO2: 94% 95%  94%  Weight:   84.1 kg   Height:        Intake/Output Summary (Last 24 hours) at 09/19/2020 0937 Last data filed at 09/19/2020 0932 Gross per 24 hour  Intake 604.88 ml  Output 401 ml  Net 203.88 ml    Last 3 Weights 09/19/2020 09/18/2020 09/17/2020  Weight (lbs) 185 lb 4.8 oz 184 lb 1.4 oz 190 lb 0.6 oz  Weight (kg) 84.052 kg 83.5 kg 86.2 kg      Telemetry    Sinus rhythm. 6 beat run of NSVT - Personally Reviewed  ECG    No new - Personally Reviewed  Physical Exam   GEN: No acute distress.   Neck: No JVD Cardiac: RRR, II/VI murmur systolic at the L sternal borders. No rubs, or gallops.  Respiratory: Clear to auscultation bilaterally. GI: Soft, nontender, non-distended  MS: No edema; No deformity. Neuro:  Nonfocal  Psych: Normal affect   Labs    High Sensitivity Troponin:   Recent Labs  Lab 09/16/20 1726 09/16/20 1927 09/17/20 0829 09/17/20 1059  TROPONINIHS 155* 1,177* 831* 665*       Chemistry Recent Labs  Lab 09/17/20 0153 09/17/20 0336 09/18/20 0023 09/19/20 0344  NA  135 137 138 135  K 2.9* 3.3* 3.3* 3.8  CL 96*  --  99 96*  CO2 28  --  32 29  GLUCOSE 148*  --  118* 139*  BUN 22  --  22 26*  CREATININE 1.09  --  0.88 0.92  CALCIUM 8.9  --  8.9 8.8*  PROT 6.4*  --  6.7 6.7  ALBUMIN 3.4*  --  3.4* 3.1*  AST 101*  --  68* 80*  ALT 71*  --  55* 42  ALKPHOS 64  --  56 54  BILITOT 1.2  --  1.0 1.1  GFRNONAA >60  --  >60 >60  ANIONGAP 11  --  7 10      Hematology Recent Labs  Lab 09/17/20 0153 09/17/20 0336 09/18/20 0023 09/19/20 0344  WBC 13.7*  --  12.3* 14.5*  RBC 3.83*  --  3.82* 2.98*  HGB 11.1* 11.6* 10.9* 8.9*  HCT 32.2* 34.0* 32.6* 25.8*  MCV 84.1  --  85.3 86.6  MCH 29.0  --  28.5 29.9  MCHC 34.5  --  33.4 34.5  RDW 13.2  --  13.6 13.3  PLT 162  --  149* 152  BNPNo results for input(s): BNP, PROBNP in the last 168 hours.   DDimer No results for input(s): DDIMER in the last 168 hours.   Radiology    No results found.  Cardiac Studies   No new   Assessment & Plan  Mr Smolinsky is a 75yo man with HTN, DM who presented after witnessed OHCA. Successfully defib x 1 by EMS with ROSC. Now HD stable with improving mental status. Admission labs suggested NSTEMI.   #OHCA, probable VT/VF EF normal Plan for [facility] Monday. If no significant CAD, will need ICD prior to discharge. - keep NPO Sunday night - keep K>4, Mg>2  #NSTEMI - cont asa - add rosuva today  #Anoxic Encephalopathy Improving  #normocytic anemia Will need to monitor closely while on AC. No obvious signs of bleeding at this time.  #Junctional bradycardia - resolved    For questions or updates, please contact [place] Please consult [email or web address] for contact info under        Signed, Vickie Epley, MD  09/19/2020, 9:37 AM

## 2020-09-19 NOTE — Progress Notes (Signed)
ANTICOAGULATION CONSULT NOTE - Follow Up Consult  Pharmacy Consult for Heparin Indication: NSTEMI  No Known Allergies  Patient Measurements: Height: 5\' 6"  (167.6 cm) Weight: 84.1 kg (185 lb 4.8 oz) IBW/kg (Calculated) : 63.8 Heparin Dosing Weight: 81.7 kg  Vital Signs: Temp: 97.8 F (36.6 C) (06/26 1134) Temp Source: Oral (06/26 1134) BP: 134/75 (06/26 1134) Pulse Rate: 81 (06/26 1134)  Labs: Recent Labs    09/16/20 1726 09/16/20 1731 09/16/20 1927 09/16/20 2009 09/17/20 0153 09/17/20 0336 09/17/20 0829 09/17/20 1059 09/17/20 1516 09/18/20 0023 09/18/20 1649 09/19/20 0344 09/19/20 1326  HGB 13.3   < >  --    < > 11.1* 11.6*  --   --   --  10.9*  --  8.9*  --   HCT 38.9*   < >  --    < > 32.2* 34.0*  --   --   --  32.6*  --  25.8*  --   PLT 211  --   --    < > 162  --   --   --   --  149*  --  152  --   LABPROT 13.5  --   --   --   --   --   --   --   --   --   --   --   --   INR 1.0  --   --   --   --   --   --   --   --   --   --   --   --   HEPARINUNFRC  --   --   --   --   --   --   --   --    < > 0.64 <0.10* 0.17* 0.26*  CREATININE 1.35*   < >  --    < > 1.09  --   --   --   --  0.88  --  0.92  --   TROPONINIHS 155*  --  1,177*  --   --   --  831* 665*  --   --   --   --   --    < > = values in this interval not displayed.    Estimated Creatinine Clearance: 69.5 mL/min (by C-G formula based on SCr of 0.92 mg/dL).   Medications:  Scheduled:   amLODipine  5 mg Oral Daily   Chlorhexidine Gluconate Cloth  6 each Topical Daily   lidocaine  1 patch Transdermal Q24H   mouth rinse  15 mL Mouth Rinse BID   rosuvastatin  20 mg Oral Daily   sertraline  50 mg Oral Daily   Infusions:   sodium chloride Stopped (09/18/20 0606)   heparin 1,200 Units/hr (09/19/20 1420)    Assessment: 75 yo M s/p OOH cardiac arrest.  Pt continues on heparin for NSTEMI with plans for cardiac cath 6/27.  Heparin level subtherapeutic but improving on heparin at 1200 units/hr.  No  bleeding noted.  Goal of Therapy:  Heparin level 0.3-0.7 units/ml Monitor platelets by anticoagulation protocol: Yes   Plan:  Increase heparin to 1400 units/hr. Repeat heparin level in 6 hours. Heparin level and CBC daily while on heparin.  [employer], Pharm.D., BCPS Clinical Pharmacist  **Pharmacist phone directory can be found on [email or web address] listed under [place].  09/19/2020 3:12 PM

## 2020-09-19 NOTE — Evaluation (Signed)
Occupational Therapy Evaluation Patient Details Name: Brian Robinson MRN: [id number] DOB: 03-10-1946 Today's Date: 09/19/2020    History of Present Illness 75y/o white male that presented to the ER 09/16/20 from home s/p cardiac arrest.  The pt was standing next to his wife when he slumped to the floor in cardiac arrest. CPR was performed by the wife. Intubated on arrival to ED. EEG shows nonspecific encephalopathy. bil anterior rib fractures s/p CPR; extubated 6/24; cardiology consult +NSTEMI   PMH HTN, DM   Clinical Impression   Pt admitted for concerns listed above. PTA pt reported that he was independent with all ADL's and IADL's, using no DME. Pt presented with continued independence with ADL's, however his activity tolerance is decreased. Pt will benefit from further education on energy conservation and an HEP prior to d/c. Acute OT will follow up to provide education and ensure pt continues to remain at mod I level after cardia cath tomorrow.     Follow Up Recommendations  No OT follow up    Equipment Recommendations  None recommended by OT    Recommendations for Other Services       Precautions / Restrictions Precautions Precautions: Fall;Other (comment) Precaution Comments: ?orthostatic; monitor Restrictions Weight Bearing Restrictions: No      Mobility Bed Mobility               General bed mobility comments: Up in recliner on entry    Transfers Overall transfer level: Needs assistance Equipment used: None Transfers: Sit to/from Stand Sit to Stand: Supervision         General transfer comment: no unsteadiness and denied dizziness    Balance Overall balance assessment: Mild deficits observed, not formally tested   Sitting balance-Leahy Scale: Good       Standing balance-Leahy Scale: Fair                             ADL either performed or assessed with clinical judgement   ADL Overall ADL's : Modified independent                                        General ADL Comments: Pt at baseline for ADL's at this time, demonstrated all ADL's with no difficulty, including functioanl mobility with no DME.     Vision Baseline Vision/History: Wears glasses Wears Glasses: Reading only Patient Visual Report: No change from baseline Vision Assessment?: No apparent visual deficits     Perception Perception Perception Tested?: No   Praxis Praxis Praxis tested?: Not tested    Pertinent Vitals/Pain Pain Assessment: No/denies pain     Hand Dominance     Extremity/Trunk Assessment Upper Extremity Assessment Upper Extremity Assessment: Overall WFL for tasks assessed   Lower Extremity Assessment Lower Extremity Assessment: Defer to PT evaluation   Cervical / Trunk Assessment Cervical / Trunk Assessment: Normal   Communication Communication Communication: No difficulties   Cognition Arousal/Alertness: Awake/alert Behavior During Therapy: WFL for tasks assessed/performed Overall Cognitive Status: Within Functional Limits for tasks assessed                                     General Comments  VSS on RA, no dizzyness or palor noted.    Exercises     Shoulder Instructions  Home Living Family/patient expects to be discharged to:: Private residence Living Arrangements: Spouse/significant other Available Help at Discharge: Family;Available 24 hours/day Type of Home: House Home Access: Stairs to enter [employer] of Steps: 4 Entrance Stairs-Rails: Right Home Layout: Two level;Able to live on main level with bedroom/bathroom Alternate Level Stairs-Number of Steps: Flight Alternate Level Stairs-Rails: Left Bathroom Shower/Tub: Occupational psychologist: Standard Bathroom Accessibility: Yes How Accessible: Accessible via walker Home Equipment: Cane - single point;Shower seat - built in          Prior Functioning/Environment Level of Independence:  Independent        Comments: denies imbalance problems        OT Problem List: Decreased strength;Decreased activity tolerance;Impaired balance (sitting and/or standing);Decreased safety awareness;Decreased knowledge of use of DME or AE;Cardiopulmonary status limiting activity      OT Treatment/Interventions: Self-care/ADL training;Therapeutic exercise;Energy conservation;DME and/or AE instruction;Therapeutic activities;Balance training;Patient/family education    OT Goals(Current goals can be found in the care plan section) Acute Rehab OT Goals Patient Stated Goal: wants to walk and go home OT Goal Formulation: With patient/family Time For Goal Achievement: 10/03/20 Potential to Achieve Goals: Good ADL Goals Pt Will Transfer to Toilet: Independently;ambulating Pt/caregiver will Perform Home Exercise Program: Increased strength;Both right and left upper extremity;With written HEP provided;Independently Additional ADL Goal #1: Pt will verbalize 3 energy conservation techniques he can use at home.  OT Frequency: Min 2X/week   Barriers to D/C:            Co-evaluation              AM-PAC OT "6 Clicks" Daily Activity     Outcome Measure Help from another person eating meals?: None Help from another person taking care of personal grooming?: None Help from another person toileting, which includes using toliet, bedpan, or urinal?: None Help from another person bathing (including washing, rinsing, drying)?: None Help from another person to put on and taking off regular upper body clothing?: None Help from another person to put on and taking off regular lower body clothing?: None 6 Click Score: 24   End of Session Nurse Communication: Mobility status  Activity Tolerance: Patient tolerated treatment well Patient left: in chair;with call bell/phone within reach;with family/visitor present  OT Visit Diagnosis: Unsteadiness on feet (R26.81);Other abnormalities of gait and  mobility (R26.89);Muscle weakness (generalized) (M62.81)                Time: 3244-0102 OT Time Calculation (min): 17 min Charges:  OT General Charges $OT Visit: 1 Visit OT Evaluation $OT Eval Moderate Complexity: [place]., OTR/L Acute Rehabilitation  Ark Agrusa Elane Yolanda Bonine 09/19/2020, 5:34 PM

## 2020-09-19 NOTE — Progress Notes (Signed)
ANTICOAGULATION CONSULT NOTE - Follow Up Consult  Pharmacy Consult for heparin Indication:  NSTEMI   Labs: Recent Labs    09/16/20 1726 09/16/20 1731 09/16/20 1927 09/16/20 2009 09/17/20 0153 09/17/20 0336 09/17/20 0829 09/17/20 1059 09/17/20 1516 09/18/20 0023 09/18/20 1649 09/19/20 0344  HGB 13.3   < >  --    < > 11.1* 11.6*  --   --   --  10.9*  --  8.9*  HCT 38.9*   < >  --    < > 32.2* 34.0*  --   --   --  32.6*  --  25.8*  PLT 211  --   --    < > 162  --   --   --   --  149*  --  152  LABPROT 13.5  --   --   --   --   --   --   --   --   --   --   --   INR 1.0  --   --   --   --   --   --   --   --   --   --   --   HEPARINUNFRC  --   --   --   --   --   --   --   --    < > 0.64 <0.10* 0.17*  CREATININE 1.35*   < >  --    < > 1.09  --   --   --   --  0.88  --  0.92  TROPONINIHS 155*  --  1,177*  --   --   --  831* 665*  --   --   --   --    < > = values in this interval not displayed.    Assessment: 75yo male subtherapeutic on heparin after two levels at goal; no gtt issues in the last 8+ hours or signs of bleeding per RN.  Goal of Therapy:  Heparin level 0.3-0.7 units/ml   Plan:  Will increase heparin gtt by 2 units/kg/hr to 1200 units/hr and check level in 8 hours.    Wynona Neat, PharmD, BCPS  09/19/2020,5:59 AM

## 2020-09-20 ENCOUNTER — Inpatient Hospital Stay (HOSPITAL_COMMUNITY): Payer: Medicare Other

## 2020-09-20 ENCOUNTER — Encounter (HOSPITAL_COMMUNITY): Payer: Self-pay | Admitting: Internal Medicine

## 2020-09-20 ENCOUNTER — Encounter (HOSPITAL_COMMUNITY)
Admission: EM | Disposition: A | Discharge status: Home Health | Payer: Self-pay | Source: Home / Self Care | Attending: Thoracic Surgery (Cardiothoracic Vascular Surgery)

## 2020-09-20 DIAGNOSIS — E119 Type 2 diabetes mellitus without complications: Secondary | ICD-10-CM

## 2020-09-20 DIAGNOSIS — I2510 Atherosclerotic heart disease of native coronary artery without angina pectoris: Secondary | ICD-10-CM

## 2020-09-20 DIAGNOSIS — I214 Non-ST elevation (NSTEMI) myocardial infarction: Principal | ICD-10-CM

## 2020-09-20 DIAGNOSIS — I462 Cardiac arrest due to underlying cardiac condition: Secondary | ICD-10-CM

## 2020-09-20 DIAGNOSIS — R001 Bradycardia, unspecified: Secondary | ICD-10-CM

## 2020-09-20 HISTORY — PX: LEFT HEART CATH AND CORONARY ANGIOGRAPHY: CATH118249

## 2020-09-20 LAB — CBC
HCT: 24.9 % — ABNORMAL LOW (ref 39.0–52.0)
Hemoglobin: 8.4 g/dL — ABNORMAL LOW (ref 13.0–17.0)
MCH: 29.1 pg (ref 26.0–34.0)
MCHC: 33.7 g/dL (ref 30.0–36.0)
MCV: 86.2 fL (ref 80.0–100.0)
Platelets: 165 10*3/uL (ref 150–400)
RBC: 2.89 MIL/uL — ABNORMAL LOW (ref 4.22–5.81)
RDW: 13.1 % (ref 11.5–15.5)
WBC: 13.8 10*3/uL — ABNORMAL HIGH (ref 4.0–10.5)
nRBC: 0 % (ref 0.0–0.2)

## 2020-09-20 LAB — COMPREHENSIVE METABOLIC PANEL
ALT: 42 U/L (ref 0–44)
AST: 133 U/L — ABNORMAL HIGH (ref 15–41)
Albumin: 3.2 g/dL — ABNORMAL LOW (ref 3.5–5.0)
Alkaline Phosphatase: 47 U/L (ref 38–126)
Anion gap: 11 (ref 5–15)
BUN: 28 mg/dL — ABNORMAL HIGH (ref 8–23)
CO2: 28 mmol/L (ref 22–32)
Calcium: 8.8 mg/dL — ABNORMAL LOW (ref 8.9–10.3)
Chloride: 91 mmol/L — ABNORMAL LOW (ref 98–111)
Creatinine, Ser: 0.86 mg/dL (ref 0.61–1.24)
GFR, Estimated: >60 mL/min (ref >60)
Glucose, Bld: 117 mg/dL — ABNORMAL HIGH (ref 70–99)
Potassium: 3.5 mmol/L (ref 3.5–5.1)
Sodium: 130 mmol/L — ABNORMAL LOW (ref 135–145)
Total Bilirubin: 1.3 mg/dL — ABNORMAL HIGH (ref 0.3–1.2)
Total Protein: 6.5 g/dL (ref 6.5–8.1)

## 2020-09-20 LAB — OCCULT BLOOD X 1 CARD TO LAB, STOOL: Fecal Occult Bld: NEGATIVE

## 2020-09-20 LAB — PHOSPHORUS: Phosphorus: 3.5 mg/dL (ref 2.5–4.6)

## 2020-09-20 LAB — HEPARIN LEVEL (UNFRACTIONATED): Heparin Unfractionated: 0.57 IU/mL (ref 0.30–0.70)

## 2020-09-20 LAB — MAGNESIUM: Magnesium: 2 mg/dL (ref 1.7–2.4)

## 2020-09-20 SURGERY — LEFT HEART CATH AND CORONARY ANGIOGRAPHY
Anesthesia: LOCAL

## 2020-09-20 MED ORDER — HEPARIN (PORCINE) 25000 UT/250ML-% IV SOLN
1750.0000 [IU]/h | INTRAVENOUS | Status: DC
  Administered 2020-09-20: 1500 [IU]/h via INTRAVENOUS
  Administered 2020-09-21: 1750 [IU]/h via INTRAVENOUS
  Filled 2020-09-20 (×3): qty 250

## 2020-09-20 MED ORDER — SENNOSIDES-DOCUSATE SODIUM 8.6-50 MG PO TABS
1.0000 | ORAL_TABLET | Freq: Every day | ORAL | Status: DC
  Administered 2020-09-21 – 2020-09-22 (×2): 1 via ORAL
  Filled 2020-09-20 (×3): qty 1

## 2020-09-20 MED ORDER — LABETALOL HCL 5 MG/ML IV SOLN: 10.0000 mg | INTRAVENOUS | Status: DC | PRN

## 2020-09-20 MED ORDER — IOHEXOL 350 MG/ML SOLN
INTRAVENOUS | Status: DC | PRN
  Administered 2020-09-20: 80 mL

## 2020-09-20 MED ORDER — POTASSIUM CHLORIDE CRYS ER 20 MEQ PO TBCR
40.0000 meq | EXTENDED_RELEASE_TABLET | Freq: Once | ORAL | Status: AC
  Administered 2020-09-20: 40 meq via ORAL
  Filled 2020-09-20: qty 2

## 2020-09-20 MED ORDER — SODIUM CHLORIDE 0.9 % IV SOLN: 250.0000 mL | INTRAVENOUS | Status: DC | PRN

## 2020-09-20 MED ORDER — LIDOCAINE HCL (PF) 1 % IJ SOLN
INTRAMUSCULAR | Status: AC
  Filled 2020-09-20: qty 30

## 2020-09-20 MED ORDER — ASPIRIN 81 MG PO CHEW
81.0000 mg | CHEWABLE_TABLET | ORAL | Status: AC
  Administered 2020-09-20: 81 mg via ORAL
  Filled 2020-09-20: qty 1

## 2020-09-20 MED ORDER — SODIUM CHLORIDE 0.9% FLUSH: 3.0000 mL | Freq: Two times a day (BID) | INTRAVENOUS | Status: DC

## 2020-09-20 MED ORDER — ENOXAPARIN SODIUM 40 MG/0.4ML IJ SOSY: 40.0000 mg | PREFILLED_SYRINGE | INTRAMUSCULAR | Status: DC

## 2020-09-20 MED ORDER — HEPARIN (PORCINE) IN NACL 2-0.9 UNITS/ML
INTRAMUSCULAR | Status: DC | PRN
  Administered 2020-09-20: 10 mL via INTRA_ARTERIAL

## 2020-09-20 MED ORDER — LIDOCAINE HCL (PF) 1 % IJ SOLN
INTRAMUSCULAR | Status: DC | PRN
  Administered 2020-09-20: 1 mL

## 2020-09-20 MED ORDER — LABETALOL HCL 5 MG/ML IV SOLN: 10.0000 mg | INTRAVENOUS | Status: AC | PRN

## 2020-09-20 MED ORDER — POTASSIUM CHLORIDE CRYS ER 20 MEQ PO TBCR
40.0000 meq | EXTENDED_RELEASE_TABLET | ORAL | Status: AC
  Administered 2020-09-20 (×2): 40 meq via ORAL
  Filled 2020-09-20 (×2): qty 2

## 2020-09-20 MED ORDER — SODIUM CHLORIDE 0.9% FLUSH: 3.0000 mL | INTRAVENOUS | Status: DC | PRN

## 2020-09-20 MED ORDER — ASPIRIN EC 81 MG PO TBEC
81.0000 mg | DELAYED_RELEASE_TABLET | Freq: Every day | ORAL | Status: DC
  Administered 2020-09-21 – 2020-09-22 (×2): 81 mg via ORAL
  Filled 2020-09-20 (×2): qty 1

## 2020-09-20 MED ORDER — FUROSEMIDE 10 MG/ML IJ SOLN
40.0000 mg | Freq: Once | INTRAMUSCULAR | Status: AC
  Administered 2020-09-20: 40 mg via INTRAVENOUS
  Filled 2020-09-20: qty 4

## 2020-09-20 MED ORDER — HEPARIN (PORCINE) IN NACL 1000-0.9 UT/500ML-% IV SOLN
INTRAVENOUS | Status: DC | PRN
  Administered 2020-09-20: 500 mL

## 2020-09-20 MED ORDER — SODIUM CHLORIDE 0.9 % IV SOLN: INTRAVENOUS | Status: DC

## 2020-09-20 MED ORDER — HYDRALAZINE HCL 20 MG/ML IJ SOLN: 10.0000 mg | INTRAMUSCULAR | Status: AC | PRN

## 2020-09-20 MED ORDER — ONDANSETRON HCL 4 MG/2ML IJ SOLN: 4.0000 mg | Freq: Four times a day (QID) | INTRAMUSCULAR | Status: DC | PRN

## 2020-09-20 MED ORDER — SODIUM CHLORIDE 0.9 % WEIGHT BASED INFUSION
1.0000 mL/kg/h | INTRAVENOUS | Status: DC
  Administered 2020-09-20: 1 mL/kg/h via INTRAVENOUS

## 2020-09-20 MED ORDER — HEPARIN SODIUM (PORCINE) 1000 UNIT/ML IJ SOLN
INTRAMUSCULAR | Status: AC
  Filled 2020-09-20: qty 1

## 2020-09-20 MED ORDER — SORBITOL 70 % SOLN
45.0000 mL | Freq: Once | Status: AC
  Administered 2020-09-20: 45 mL via ORAL
  Filled 2020-09-20: qty 60

## 2020-09-20 MED ORDER — HEPARIN (PORCINE) IN NACL 1000-0.9 UT/500ML-% IV SOLN
INTRAVENOUS | Status: AC
  Filled 2020-09-20: qty 1000

## 2020-09-20 MED ORDER — PANTOPRAZOLE SODIUM 40 MG PO TBEC
40.0000 mg | DELAYED_RELEASE_TABLET | Freq: Every day | ORAL | Status: DC
  Administered 2020-09-20 – 2020-09-22 (×3): 40 mg via ORAL
  Filled 2020-09-20 (×3): qty 1

## 2020-09-20 MED ORDER — ACETAMINOPHEN 325 MG PO TABS
650.0000 mg | ORAL_TABLET | ORAL | Status: DC | PRN
  Administered 2020-09-21 (×2): 650 mg via ORAL
  Filled 2020-09-20 (×2): qty 2

## 2020-09-20 MED ORDER — SORBITOL 70 % SOLN: 60.0000 mL | Freq: Once | Status: DC

## 2020-09-20 MED ORDER — SODIUM CHLORIDE 0.9 % WEIGHT BASED INFUSION
3.0000 mL/kg/h | INTRAVENOUS | Status: DC
  Administered 2020-09-20: 3 mL/kg/h via INTRAVENOUS

## 2020-09-20 MED ORDER — HEPARIN SODIUM (PORCINE) 1000 UNIT/ML IJ SOLN
INTRAMUSCULAR | Status: DC | PRN
  Administered 2020-09-20: 4000 [IU] via INTRAVENOUS

## 2020-09-20 MED ORDER — SODIUM CHLORIDE 0.9% FLUSH
3.0000 mL | Freq: Two times a day (BID) | INTRAVENOUS | Status: DC
  Administered 2020-09-20 – 2020-09-22 (×6): 3 mL via INTRAVENOUS

## 2020-09-20 MED ORDER — VERAPAMIL HCL 2.5 MG/ML IV SOLN
INTRAVENOUS | Status: AC
  Filled 2020-09-20: qty 2

## 2020-09-20 SURGICAL SUPPLY — 10 items
CATH 5FR JL3.5 JR4 ANG PIG MP (CATHETERS) ×2 IMPLANT
DEVICE RAD COMP TR BAND LRG (VASCULAR PRODUCTS) ×2 IMPLANT
GLIDESHEATH SLEND SS 6F .021 (SHEATH) ×2 IMPLANT
GUIDEWIRE ANGLED .035X150CM (WIRE) ×2 IMPLANT
KIT HEART LEFT (KITS) ×2 IMPLANT
PACK CARDIAC CATHETERIZATION (CUSTOM PROCEDURE TRAY) ×2 IMPLANT
TRANSDUCER W/STOPCOCK (MISCELLANEOUS) ×2 IMPLANT
TUBING CIL FLEX 10 FLL-RA (TUBING) ×2 IMPLANT
WIRE EMERALD 3MM-J .035X260CM (WIRE) ×2 IMPLANT
WIRE HI TORQ VERSACORE-J 145CM (WIRE) ×2 IMPLANT

## 2020-09-20 NOTE — H&P (View-Only) (Signed)
Reason for Consult:3 vessel CAD, s/p cardiac arrest/ MI Referring Physician: Dr. Burt Knack End   Brian Robinson is an 75 y.o. male.  HPI: 75 yo man with no prior cardiac history presents after an out of hospital cardiac arrest.   He had a witnessed cardiac arrest on 6/23. Suddenly slumped to floor. Wife did compressions and estimated EMS arrived with 5-6 minutes. Was shocked once with ROSC. Severe hypertension after ROSC. He was intubated on arrival and has no recollection of events. History obtained from Brian Robinson.  He ruled in for MI. 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 Has had some improvement of mental status but still severe short term memory deficit and hesitancy of speech.  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   Past Medical History:  Diagnosis Date   COVID-19    received Mab 01/2020   Diabetes mellitus without complication ([place])    Heart murmur    Hypertension    Mild aortic stenosis     Past Surgical History:  Procedure Laterality Date   LEFT HEART CATH AND CORONARY ANGIOGRAPHY N/A 09/20/2020   Procedure: LEFT HEART CATH AND CORONARY ANGIOGRAPHY;  Surgeon: Nelva Bush, MD;  Location: [facility];  Service: Cardiovascular;  Laterality: N/A;    Family History  Problem Relation Age of Onset   Cancer Mother    Cancer Father    CAD Neg Hx     Social History:  has no history on file for tobacco use, alcohol use, and drug use.  Allergies: No Known Allergies  Medications: Scheduled:  amLODipine  5 mg Oral Daily   [START ON 09/21/2020] aspirin EC  81 mg Oral Daily   Chlorhexidine Gluconate Cloth  6 each Topical Daily   [START ON 09/21/2020] enoxaparin (LOVENOX) injection  40 mg Subcutaneous Q24H   lidocaine  1 patch Transdermal Q24H   mouth rinse  15 mL Mouth Rinse BID   pantoprazole  40 mg Oral Daily   potassium chloride  40 mEq Oral Q4H    rosuvastatin  20 mg Oral Daily   senna-docusate  1 tablet Oral Daily   sertraline  50 mg Oral Daily   sodium chloride flush  3 mL Intravenous Q12H    Results for orders placed or performed during the hospital encounter of 09/16/20 (from the past 48 hour(s))  Heparin level (unfractionated)     Status: Abnormal   Collection Time: 09/18/20  4:49 PM  Result Value Ref Range   Heparin Unfractionated <0.10 (L) 0.30 - 0.70 IU/mL    Comment: (NOTE) The clinical reportable range upper limit is being lowered to >1.10 to align with the FDA approved guidance for the current laboratory assay.  If heparin results are below expected values, and patient dosage has  been confirmed, suggest follow up testing of antithrombin III levels. Performed at [facility], [place] [address], [place], [place] [address]   Magnesium     Status: None   Collection Time: 09/19/20  3:44 AM  Result Value Ref Range   Magnesium 1.9 1.7 - 2.4 mg/dL    Comment: Performed at [facility] [address] [place], [place] [address]  Phosphorus     Status: None   Collection Time: 09/19/20  3:44 AM  Result Value Ref Range   Phosphorus 2.8 2.5 - 4.6 mg/dL    Comment: Performed at [facility]  Lab, [address] [place], [place] [address]  CBC     Status: Abnormal   Collection Time: 09/19/20  3:44 AM  Result Value Ref Range   WBC 14.5 (H) 4.0 - 10.5 K/uL   RBC 2.98 (L) 4.22 - 5.81 MIL/uL   Hemoglobin 8.9 (L) 13.0 - 17.0 g/dL   HCT 25.8 (L) 39.0 - 52.0 %   MCV 86.6 80.0 - 100.0 fL   MCH 29.9 26.0 - 34.0 pg   MCHC 34.5 30.0 - 36.0 g/dL   RDW 13.3 11.5 - 15.5 %   Platelets 152 150 - 400 K/uL   nRBC 0.0 0.0 - 0.2 %    Comment: Performed at [facility], [place] [address], [place], [place] [address]  Comprehensive metabolic panel     Status: Abnormal   Collection Time: 09/19/20  3:44 AM  Result Value Ref Range   Sodium 135 135 - 145 mmol/L   Potassium 3.8 3.5 - 5.1 mmol/L   Chloride 96 (L) 98 - 111  mmol/L   CO2 29 22 - 32 mmol/L   Glucose, Bld 139 (H) 70 - 99 mg/dL    Comment: Glucose reference range applies only to samples taken after fasting for at least 8 hours.   BUN 26 (H) 8 - 23 mg/dL   Creatinine, Ser 0.92 0.61 - 1.24 mg/dL   Calcium 8.8 (L) 8.9 - 10.3 mg/dL   Total Protein 6.7 6.5 - 8.1 g/dL   Albumin 3.1 (L) 3.5 - 5.0 g/dL   AST 80 (H) 15 - 41 U/L   ALT 42 0 - 44 U/L   Alkaline Phosphatase 54 38 - 126 U/L   Total Bilirubin 1.1 0.3 - 1.2 mg/dL   GFR, Estimated >60 >60 mL/min    Comment: (NOTE) Calculated using the CKD-EPI Creatinine Equation (2021)    Anion gap 10 5 - 15    Comment: Performed at [facility], [place] [address], [place], [place] [address]  Heparin level (unfractionated)     Status: Abnormal   Collection Time: 09/19/20  3:44 AM  Result Value Ref Range   Heparin Unfractionated 0.17 (L) 0.30 - 0.70 IU/mL    Comment: (NOTE) The clinical reportable range upper limit is being lowered to >1.10 to align with the FDA approved guidance for the current laboratory assay.  If heparin results are below expected values, and patient dosage has  been confirmed, suggest follow up testing of antithrombin III levels. Performed at [facility], [place] [address] [place], [place] [address]   Heparin level (unfractionated)     Status: Abnormal   Collection Time: 09/19/20  1:26 PM  Result Value Ref Range   Heparin Unfractionated 0.26 (L) 0.30 - 0.70 IU/mL    Comment: (NOTE) The clinical reportable range upper limit is being lowered to >1.10 to align with the FDA approved guidance for the current laboratory assay.  If heparin results are below expected values, and patient dosage has  been confirmed, suggest follow up testing of antithrombin III levels. Performed at [facility], [place] [address], [place], [place] [address]   Heparin level (unfractionated)     Status: Abnormal   Collection Time: 09/19/20  9:55 PM  Result Value Ref Range   Heparin  Unfractionated 0.22 (L) 0.30 - 0.70 IU/mL    Comment: (NOTE) The clinical reportable range upper limit is being lowered to >1.10 to align with the FDA approved guidance for the current laboratory assay.  If heparin results are below expected values, and patient dosage has  been confirmed, suggest follow up testing of antithrombin III levels. Performed at [facility], [place] [address], [place], [place] [address]   Magnesium     Status: None   Collection Time: 09/20/20  4:31 AM  Result Value Ref Range   Magnesium 2.0 1.7 - 2.4 mg/dL    Comment: Performed at [facility] [address], [place], [place] [address]  Phosphorus     Status: None   Collection Time: 09/20/20  4:31 AM  Result Value Ref Range   Phosphorus 3.5 2.5 - 4.6 mg/dL    Comment: Performed at [facility] [address], [place], [place] [address]  CBC     Status: Abnormal   Collection Time: 09/20/20  4:31 AM  Result Value Ref Range   WBC 13.8 (H) 4.0 - 10.5 K/uL   RBC 2.89 (L) 4.22 - 5.81 MIL/uL   Hemoglobin 8.4 (L) 13.0 - 17.0 g/dL   HCT 24.9 (L) 39.0 - 52.0 %   MCV 86.2 80.0 - 100.0 fL   MCH 29.1 26.0 - 34.0 pg   MCHC 33.7 30.0 - 36.0 g/dL   RDW 13.1 11.5 - 15.5 %   Platelets 165 150 - 400 K/uL   nRBC 0.0 0.0 - 0.2 %    Comment: Performed at [facility], [place] [address] , [place] [address]  Comprehensive metabolic panel     Status: Abnormal   Collection Time: 09/20/20  4:31 AM  Result Value Ref Range   Sodium 130 (L) 135 - 145 mmol/L   Potassium 3.5 3.5 - 5.1 mmol/L   Chloride 91 (L) 98 - 111 mmol/L   CO2 28 22 - 32 mmol/L   Glucose, Bld 117 (H) 70 - 99 mg/dL    Comment: Glucose reference range applies only to samples taken after fasting for at least 8 hours.   BUN 28 (H) 8 - 23 mg/dL   Creatinine, Ser 0.86 0.61 - 1.24 mg/dL   Calcium 8.8 (L) 8.9 - 10.3 mg/dL   Total Protein 6.5 6.5 - 8.1 g/dL   Albumin 3.2 (L) 3.5 - 5.0 g/dL   AST 133 (H) 15 - 41 U/L   ALT 42 0 - 44  U/L   Alkaline Phosphatase 47 38 - 126 U/L   Total Bilirubin 1.3 (H) 0.3 - 1.2 mg/dL   GFR, Estimated >60 >60 mL/min    Comment: (NOTE) Calculated using the CKD-EPI Creatinine Equation (2021)    Anion gap 11 5 - 15    Comment: Performed at [facility], [place] [address], [place],  [address]  Occult blood card to lab, stool     Status: None   Collection Time: 09/20/20  7:55 AM  Result Value Ref Range   Fecal Occult Bld NEGATIVE NEGATIVE    Comment: Performed at [facility] [address] [place], [place] [address]  Heparin level (unfractionated)     Status: None   Collection Time: 09/20/20  8:39 AM  Result Value Ref Range   Heparin Unfractionated 0.57 0.30 - 0.70 IU/mL    Comment: (NOTE) The clinical reportable range upper limit is being lowered to >1.10 to align with the FDA approved guidance for the current laboratory assay.  If heparin results are below expected values, and patient dosage has  been confirmed, suggest follow up testing of antithrombin III levels. Performed at [facility], [place] [address] [place], [place] [address]     CARDIAC CATHETERIZATION  Result Date: 09/20/2020 Conclusions: 1. Severe three-vessel coronary artery disease, including chronic total occlusion of proximal LAD with left-to-left collaterals, 75% ostial/proximal ramus intermedius stenosis, and sequential 75% ostial and 90% distal RCA lesions.  LCx and OM3 have mild to moderate disease of up to 40%. 2. Moderately elevated left ventricular filling pressure (LVEDP ~30 mmHg). 3. Mild aortic stenosis (peak-to-peak gradient 10-15 mmHg. Recommendations: 1. Imaged reviewed with Dr. Burt Knack during the procedure.  We will plan for cardiac surgery consultation for CABG given severe LAD and RCA disease with good distal targets in both vessels. 2. Defer reinitiation of IV heparin, as the coronary artery disease appears chronic and hemoglobin has trended down significantly since admission. 3.  Aggressive secondary prevention. 4. Diurese with furosemide 40 mg IV x 1.  Further diureses to be based on urine output, volume status, and renal function. Nelva Bush, MD [facility]    I personally reviewed the cath images. There is a CTO of the LAD with collaterals, tight RCA stenosis and disease in a ramus branch.   Review of Systems  Constitutional:  Positive for activity change and fatigue.  Respiratory:  Positive for shortness of breath.   Neurological:  Positive for dizziness and syncope.  Blood pressure 126/64, pulse 88, temperature 98.5 F (36.9 C), temperature source Oral, resp. rate 20, height 5\' 6"  (1.676 m), weight 86.6 kg, SpO2 92 %. Physical Exam Vitals reviewed.  Constitutional:      General: He is not in acute distress.    Appearance: Normal appearance.  HENT:     Head: Normocephalic and atraumatic.  Eyes:     Extraocular Movements: Extraocular movements intact.  Neck:     Vascular: No carotid bruit.  Cardiovascular:     Rate and Rhythm: Normal rate and regular rhythm.     Heart sounds: Normal heart sounds. No murmur heard.   No friction rub. No gallop.  Pulmonary:     Effort: Pulmonary effort is normal. No respiratory distress.     Breath sounds: Normal breath sounds. No wheezing or rales.  Abdominal:     General: There is no distension.     Palpations: Abdomen is soft.  Skin:    General: Skin is warm and dry.  Neurological:     General: No focal deficit present.     Mental Status: He is alert and oriented to person, place, and time.     Cranial Nerves: No cranial nerve deficit.     Motor: No weakness.  Psychiatric:     Comments: Odd affect, at times impulsive    Assessment/Plan: 21 man with a history of type II diabetes and hypertension. Also recent diagnosis of hyperlipidemia who presented with an OOH cardiac arrest. Ruled in for MI and at cath has severe LAD and RCA disease with stenosis of a ramus branch as well.  CABG indicated for survival  benefit.   He does have some anoxic encephalopathy with short term memory loss and impulse control issues.   I discussed  the general nature of the procedure, including the need for general anesthesia, the use of cardiopulmonary bypass, the incisions to be used and the use of drainage tubes postoperatively. We discussed the expected hospital stay, overall recovery and short and long term outcomes. I informed them of the indications, risks, benefits and alternatives.  They understand the risks include, but are not limited to death, stroke, MI, DVT/PE,  bleeding, possible need for transfusion, infections, cardiac arrhythmias, as well as other organ system dysfunction including respiratory, renal, or GI complications. They understand CABG could significantly worsen neurologic status and does not preclude the possibility of another cardiac arrest.  Patient and family in agreement with proceeding with CABG  Plan OR Thursday 6/30  [name] 09/20/2020, 4:01 PM

## 2020-09-20 NOTE — Plan of Care (Signed)
  Problem: Safety: Goal: Non-violent Restraint(s) Outcome: Progressing   

## 2020-09-20 NOTE — Consult Note (Signed)
Reason for Consult:3 vessel CAD, s/p cardiac arrest/ MI Referring Physician: Dr. Burt Knack End   Brian Robinson is an 75 y.o. male.  HPI: 75 yo man with no prior cardiac history presents after an out of hospital cardiac arrest.   He had a witnessed cardiac arrest on 6/23. Suddenly slumped to floor. Wife did compressions and estimated EMS arrived with 5-6 minutes. Was shocked once with ROSC. Severe hypertension after ROSC. He was intubated on arrival and has no recollection of events. History obtained from Brian Robinson.  He ruled in for MI. 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 Has had some improvement of mental status but still severe short term memory deficit and hesitancy of speech.  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   Past Medical History:  Diagnosis Date   COVID-19    received Mab 01/2020   Diabetes mellitus without complication ([place])    Heart murmur    Hypertension    Mild aortic stenosis     Past Surgical History:  Procedure Laterality Date   LEFT HEART CATH AND CORONARY ANGIOGRAPHY N/A 09/20/2020   Procedure: LEFT HEART CATH AND CORONARY ANGIOGRAPHY;  Surgeon: Nelva Bush, MD;  Location: [facility];  Service: Cardiovascular;  Laterality: N/A;    Family History  Problem Relation Age of Onset   Cancer Mother    Cancer Father    CAD Neg Hx     Social History:  has no history on file for tobacco use, alcohol use, and drug use.  Allergies: No Known Allergies  Medications: Scheduled:  amLODipine  5 mg Oral Daily   [START ON 09/21/2020] aspirin EC  81 mg Oral Daily   Chlorhexidine Gluconate Cloth  6 each Topical Daily   [START ON 09/21/2020] enoxaparin (LOVENOX) injection  40 mg Subcutaneous Q24H   lidocaine  1 patch Transdermal Q24H   mouth rinse  15 mL Mouth Rinse BID   pantoprazole  40 mg Oral Daily   potassium chloride  40 mEq Oral Q4H    rosuvastatin  20 mg Oral Daily   senna-docusate  1 tablet Oral Daily   sertraline  50 mg Oral Daily   sodium chloride flush  3 mL Intravenous Q12H    Results for orders placed or performed during the hospital encounter of 09/16/20 (from the past 48 hour(s))  Heparin level (unfractionated)     Status: Abnormal   Collection Time: 09/18/20  4:49 PM  Result Value Ref Range   Heparin Unfractionated <0.10 (L) 0.30 - 0.70 IU/mL    Comment: (NOTE) The clinical reportable range upper limit is being lowered to >1.10 to align with the FDA approved guidance for the current laboratory assay.  If heparin results are below expected values, and patient dosage has  been confirmed, suggest follow up testing of antithrombin III levels. Performed at [facility], [place] [address], [place], [place] [address]   Magnesium     Status: None   Collection Time: 09/19/20  3:44 AM  Result Value Ref Range   Magnesium 1.9 1.7 - 2.4 mg/dL    Comment: Performed at [facility] [address], [place], [place] [address]  Phosphorus     Status: None   Collection Time: 09/19/20  3:44 AM  Result Value Ref Range   Phosphorus 2.8 2.5 - 4.6 mg/dL    Comment: Performed at [facility], [address], [place], [place] [address]  CBC     Status: Abnormal   Collection Time: 09/19/20  3:44 AM  Result Value Ref Range   WBC 14.5 (H) 4.0 - 10.5 K/uL   RBC 2.98 (L) 4.22 - 5.81 MIL/uL   Hemoglobin 8.9 (L) 13.0 - 17.0 g/dL   HCT 25.8 (L) 39.0 - 52.0 %   MCV 86.6 80.0 - 100.0 fL   MCH 29.9 26.0 - 34.0 pg   MCHC 34.5 30.0 - 36.0 g/dL   RDW 13.3 11.5 - 15.5 %   Platelets 152 150 - 400 K/uL   nRBC 0.0 0.0 - 0.2 %    Comment: Performed at [facility], [place] [address] [place], [place] [address]  Comprehensive metabolic panel     Status: Abnormal   Collection Time: 09/19/20  3:44 AM  Result Value Ref Range   Sodium 135 135 - 145 mmol/L   Potassium 3.8 3.5 - 5.1 mmol/L   Chloride 96 (L) 98 - 111  mmol/L   CO2 29 22 - 32 mmol/L   Glucose, Bld 139 (H) 70 - 99 mg/dL    Comment: Glucose reference range applies only to samples taken after fasting for at least 8 hours.   BUN 26 (H) 8 - 23 mg/dL   Creatinine, Ser 0.92 0.61 - 1.24 mg/dL   Calcium 8.8 (L) 8.9 - 10.3 mg/dL   Total Protein 6.7 6.5 - 8.1 g/dL   Albumin 3.1 (L) 3.5 - 5.0 g/dL   AST 80 (H) 15 - 41 U/L   ALT 42 0 - 44 U/L   Alkaline Phosphatase 54 38 - 126 U/L   Total Bilirubin 1.1 0.3 - 1.2 mg/dL   GFR, Estimated >60 >60 mL/min    Comment: (NOTE) Calculated using the CKD-EPI Creatinine Equation (2021)    Anion gap 10 5 - 15    Comment: Performed at [facility], [place] [address], [place], [place] [address]  Heparin level (unfractionated)     Status: Abnormal   Collection Time: 09/19/20  3:44 AM  Result Value Ref Range   Heparin Unfractionated 0.17 (L) 0.30 - 0.70 IU/mL    Comment: (NOTE) The clinical reportable range upper limit is being lowered to >1.10 to align with the FDA approved guidance for the current laboratory assay.  If heparin results are below expected values, and patient dosage has  been confirmed, suggest follow up testing of antithrombin III levels. Performed at [facility], [place] [address], [place], [place] [address]   Heparin level (unfractionated)     Status: Abnormal   Collection Time: 09/19/20  1:26 PM  Result Value Ref Range   Heparin Unfractionated 0.26 (L) 0.30 - 0.70 IU/mL    Comment: (NOTE) The clinical reportable range upper limit is being lowered to >1.10 to align with the FDA approved guidance for the current laboratory assay.  If heparin results are below expected values, and patient dosage has  been confirmed, suggest follow up testing of antithrombin III levels. Performed at [facility], [place] [address], [place], [place] [address]   Heparin level (unfractionated)     Status: Abnormal   Collection Time: 09/19/20  9:55 PM  Result Value Ref Range   Heparin  Unfractionated 0.22 (L) 0.30 - 0.70 IU/mL    Comment: (NOTE) The clinical reportable range upper limit is being lowered to >1.10 to align with the FDA approved guidance for the current laboratory assay.  If heparin results are below expected values, and patient dosage has  been confirmed, suggest follow up testing of antithrombin III levels. Performed at [facility], [place] [address], [place], [place] [address]   Magnesium     Status: None   Collection Time: 09/20/20  4:31 AM  Result Value Ref Range   Magnesium 2.0 1.7 - 2.4 mg/dL    Comment: Performed at [facility] [address] [place], [place] [address]  Phosphorus     Status: None   Collection Time: 09/20/20  4:31 AM  Result Value Ref Range   Phosphorus 3.5 2.5 - 4.6 mg/dL    Comment: Performed at [facility] [address] [place], [place] [address]  CBC     Status: Abnormal   Collection Time: 09/20/20  4:31 AM  Result Value Ref Range   WBC 13.8 (H) 4.0 - 10.5 K/uL   RBC 2.89 (L) 4.22 - 5.81 MIL/uL   Hemoglobin 8.4 (L) 13.0 - 17.0 g/dL   HCT 24.9 (L) 39.0 - 52.0 %   MCV 86.2 80.0 - 100.0 fL   MCH 29.1 26.0 - 34.0 pg   MCHC 33.7 30.0 - 36.0 g/dL   RDW 13.1 11.5 - 15.5 %   Platelets 165 150 - 400 K/uL   nRBC 0.0 0.0 - 0.2 %    Comment: Performed at [facility], [place] [address], [place], [place] [address]  Comprehensive metabolic panel     Status: Abnormal   Collection Time: 09/20/20  4:31 AM  Result Value Ref Range   Sodium 130 (L) 135 - 145 mmol/L   Potassium 3.5 3.5 - 5.1 mmol/L   Chloride 91 (L) 98 - 111 mmol/L   CO2 28 22 - 32 mmol/L   Glucose, Bld 117 (H) 70 - 99 mg/dL    Comment: Glucose reference range applies only to samples taken after fasting for at least 8 hours.   BUN 28 (H) 8 - 23 mg/dL   Creatinine, Ser 0.86 0.61 - 1.24 mg/dL   Calcium 8.8 (L) 8.9 - 10.3 mg/dL   Total Protein 6.5 6.5 - 8.1 g/dL   Albumin 3.2 (L) 3.5 - 5.0 g/dL   AST 133 (H) 15 - 41 U/L   ALT 42 0 - 44  U/L   Alkaline Phosphatase 47 38 - 126 U/L   Total Bilirubin 1.3 (H) 0.3 - 1.2 mg/dL   GFR, Estimated >60 >60 mL/min    Comment: (NOTE) Calculated using the CKD-EPI Creatinine Equation (2021)    Anion gap 11 5 - 15    Comment: Performed at [facility], [place] [address] [place],  [address]  Occult blood card to lab, stool     Status: None   Collection Time: 09/20/20  7:55 AM  Result Value Ref Range   Fecal Occult Bld NEGATIVE NEGATIVE    Comment: Performed at [facility] [address] [place], [place] [address]  Heparin level (unfractionated)     Status: None   Collection Time: 09/20/20  8:39 AM  Result Value Ref Range   Heparin Unfractionated 0.57 0.30 - 0.70 IU/mL    Comment: (NOTE) The clinical reportable range upper limit is being lowered to >1.10 to align with the FDA approved guidance for the current laboratory assay.  If heparin results are below expected values, and patient dosage has  been confirmed, suggest follow up testing of antithrombin III levels. Performed at [facility], [place] [address] [place], [place] [address]     CARDIAC CATHETERIZATION  Result Date: 09/20/2020 Conclusions: 1. Severe three-vessel coronary artery disease, including chronic total occlusion of proximal LAD with left-to-left collaterals, 75% ostial/proximal ramus intermedius stenosis, and sequential 75% ostial and 90% distal RCA lesions.  LCx and OM3 have mild to moderate disease of up to 40%. 2. Moderately elevated left ventricular filling pressure (LVEDP ~30 mmHg). 3. Mild aortic stenosis (peak-to-peak gradient 10-15 mmHg. Recommendations: 1. Imaged reviewed with Dr. Burt Knack during the procedure.  We will plan for cardiac surgery consultation for CABG given severe LAD and RCA disease with good distal targets in both vessels. 2. Defer reinitiation of IV heparin, as the coronary artery disease appears chronic and hemoglobin has trended down significantly since admission. 3.  Aggressive secondary prevention. 4. Diurese with furosemide 40 mg IV x 1.  Further diureses to be based on urine output, volume status, and renal function. Nelva Bush, MD [facility]    I personally reviewed the cath images. There is a CTO of the LAD with collaterals, tight RCA stenosis and disease in a ramus branch.   Review of Systems  Constitutional:  Positive for activity change and fatigue.  Respiratory:  Positive for shortness of breath.   Neurological:  Positive for dizziness and syncope.  Blood pressure 126/64, pulse 88, temperature 98.5 F (36.9 C), temperature source Oral, resp. rate 20, height 5\' 6"  (1.676 m), weight 86.6 kg, SpO2 92 %. Physical Exam Vitals reviewed.  Constitutional:      General: He is not in acute distress.    Appearance: Normal appearance.  HENT:     Head: Normocephalic and atraumatic.  Eyes:     Extraocular Movements: Extraocular movements intact.  Neck:     Vascular: No carotid bruit.  Cardiovascular:     Rate and Rhythm: Normal rate and regular rhythm.     Heart sounds: Normal heart sounds. No murmur heard.   No friction rub. No gallop.  Pulmonary:     Effort: Pulmonary effort is normal. No respiratory distress.     Breath sounds: Normal breath sounds. No wheezing or rales.  Abdominal:     General: There is no distension.     Palpations: Abdomen is soft.  Skin:    General: Skin is warm and dry.  Neurological:     General: No focal deficit present.     Mental Status: He is alert and oriented to person, place, and time.     Cranial Nerves: No cranial nerve deficit.     Motor: No weakness.  Psychiatric:     Comments: Odd affect, at times impulsive    Assessment/Plan: 38 man with a history of type II diabetes and hypertension. Also recent diagnosis of hyperlipidemia who presented with an OOH cardiac arrest. Ruled in for MI and at cath has severe LAD and RCA disease with stenosis of a ramus branch as well.  CABG indicated for survival  benefit.   He does have some anoxic encephalopathy with short term memory loss and impulse control issues.   I discussed  the general nature of the procedure, including the need for general anesthesia, the use of cardiopulmonary bypass, the incisions to be used and the use of drainage tubes postoperatively. We discussed the expected hospital stay, overall recovery and short and long term outcomes. I informed them of the indications, risks, benefits and alternatives.  They understand the risks include, but are not limited to death, stroke, MI, DVT/PE,  bleeding, possible need for transfusion, infections, cardiac arrhythmias, as well as other organ system dysfunction including respiratory, renal, or GI complications. They understand CABG could significantly worsen neurologic status and does not preclude the possibility of another cardiac arrest.  Patient and family in agreement with proceeding with CABG  Plan OR Thursday 6/30  [name] 09/20/2020, 4:01 PM

## 2020-09-20 NOTE — Progress Notes (Addendum)
ANTICOAGULATION CONSULT NOTE - Initial Consult  Pharmacy Consult for heparin Indication: chest pain/ACS pending CABG  No Known Allergies  Patient Measurements: Height: 5\' 6"  (167.6 cm) Weight: 86.6 kg (191 lb 0.1 oz) IBW/kg (Calculated) : 63.8 Heparin Dosing Weight: 82kg  Vital Signs: Temp: 98.5 F (36.9 C) (06/27 1102) Temp Source: Oral (06/27 1102) BP: 126/64 (06/27 1144) Pulse Rate: 88 (06/27 1144)  Labs: Recent Labs    09/18/20 0023 09/18/20 1649 09/19/20 0344 09/19/20 1326 09/19/20 2155 09/20/20 0431 09/20/20 0839  HGB 10.9*  --  8.9*  --   --  8.4*  --   HCT 32.6*  --  25.8*  --   --  24.9*  --   PLT 149*  --  152  --   --  165  --   HEPARINUNFRC 0.64   < > 0.17* 0.26* 0.22*  --  0.57  CREATININE 0.88  --  0.92  --   --  0.86  --    < > = values in this interval not displayed.    Estimated Creatinine Clearance: 75.3 mL/min (by C-G formula based on SCr of 0.86 mg/dL).   Medical History: Past Medical History:  Diagnosis Date   COVID-19    received Mab 01/2020   Diabetes mellitus without complication ([facility])    Heart murmur    Hypertension    Mild aortic stenosis      Assessment: 75 yo M s/p cath with multivessel CAD pending CABG 6/30. No anticoagulation prior to admission. Pharmacy consulted for heparin. No further concern for GIB, FOBT negative.   Enoxaparin 40mg  ordered but never given; received 4000 units heparin in cath at 9:45. Previously therapeutic on heparin 1600 units/hr with HL uptrending.  Goal of Therapy:  Heparin level 0.3-0.7 units/ml Monitor platelets by anticoagulation protocol: Yes   Plan:  Stop enoxaparin Start heparin 1500 units/hr F/u 8hr HL  Monitor daily HL, CBC/plt Monitor for signs/symptoms of bleeding   Benetta Spar, PharmD, BCPS, BCCP Clinical Pharmacist  Please check AMION for all [place] phone numbers After 10:00 PM, call [place]

## 2020-09-20 NOTE — Progress Notes (Addendum)
Progress Note  Patient Name: Brian Robinson Date of Encounter: 09/20/2020  [facility] Cardiologist: New to Dr. Claiborne Billings   Subjective   Hemoglobin dropping down since admission.  No bowel movement since admit.  No chest pain or shortness of breath.  Denies prior bleeding issue.  Inpatient Medications    Scheduled Meds:  amLODipine  5 mg Oral Daily   Chlorhexidine Gluconate Cloth  6 each Topical Daily   lidocaine  1 patch Transdermal Q24H   mouth rinse  15 mL Mouth Rinse BID   rosuvastatin  20 mg Oral Daily   sertraline  50 mg Oral Daily   sodium chloride flush  3 mL Intravenous Q12H   Continuous Infusions:  sodium chloride Stopped (09/18/20 0606)   sodium chloride     sodium chloride 1 mL/kg/hr (09/20/20 0601)   heparin 1,600 Units/hr (09/19/20 2306)   PRN Meds: sodium chloride, sodium chloride, diphenhydrAMINE, docusate sodium, HYDROcodone-acetaminophen, labetalol, morphine injection, polyethylene glycol, sodium chloride flush   Vital Signs    Vitals:   09/20/20 0022 09/20/20 0058 09/20/20 0500 09/20/20 0501  BP: 123/64   120/61  Pulse: 89   88  Resp: 18   18  Temp: 99.5 F (37.5 C)   99 F (37.2 C)  TempSrc: Oral   Oral  SpO2: 100%   95%  Weight:  85.6 kg 86.6 kg   Height:        Intake/Output Summary (Last 24 hours) at 09/20/2020 0753 Last data filed at 09/20/2020 0500 Gross per 24 hour  Intake 1057.79 ml  Output 325 ml  Net 732.79 ml   Last 3 Weights 09/20/2020 09/20/2020 09/19/2020  Weight (lbs) 191 lb 0.1 oz 188 lb 12.8 oz 185 lb 4.8 oz  Weight (kg) 86.639 kg 85.639 kg 84.052 kg      Telemetry    Normal sinus rhythm- Personally Reviewed  ECG    No new tracing  Physical Exam   GEN: No acute distress.   Neck: No JVD Cardiac: RRR, no murmurs, rubs, or gallops.  Respiratory: Clear to auscultation bilaterally. GI: Soft, nontender, non-distended  MS: No edema; No deformity. Neuro:  Nonfocal  Psych: Normal affect   Labs    High Sensitivity  Troponin:   Recent Labs  Lab 09/16/20 1726 09/16/20 1927 09/17/20 0829 09/17/20 1059  TROPONINIHS 155* 1,177* 831* 665*      Chemistry Recent Labs  Lab 09/18/20 0023 09/19/20 0344 09/20/20 0431  NA 138 135 130*  K 3.3* 3.8 3.5  CL 99 96* 91*  CO2 32 29 28  GLUCOSE 118* 139* 117*  BUN 22 26* 28*  CREATININE 0.88 0.92 0.86  CALCIUM 8.9 8.8* 8.8*  PROT 6.7 6.7 6.5  ALBUMIN 3.4* 3.1* 3.2*  AST 68* 80* 133*  ALT 55* 42 42  ALKPHOS 56 54 47  BILITOT 1.0 1.1 1.3*  GFRNONAA >60 >60 >60  ANIONGAP 7 10 11      Hematology Recent Labs  Lab 09/18/20 0023 09/19/20 0344 09/20/20 0431  WBC 12.3* 14.5* 13.8*  RBC 3.82* 2.98* 2.89*  HGB 10.9* 8.9* 8.4*  HCT 32.6* 25.8* 24.9*  MCV 85.3 86.6 86.2  MCH 28.5 29.9 29.1  MCHC 33.4 34.5 33.7  RDW 13.6 13.3 13.1  PLT 149* 152 165    Radiology    No results found.  Cardiac Studies   Echo 09/17/20  1. Mild hypokinesis of the distal septum with overall preserved LV  function; calcified aortic valve with mild AS by doppler but  visually  looks moderate.   2. Left ventricular ejection fraction, by estimation, is 55 to 60%. The  left ventricle has normal function. The left ventricle demonstrates  regional wall motion abnormalities (see scoring diagram/findings for  description). There is mild left ventricular   hypertrophy. Left ventricular diastolic parameters are consistent with  Grade II diastolic dysfunction (pseudonormalization). Elevated left atrial  pressure.   3. Right ventricular systolic function is normal. The right ventricular  size is normal.   4. The mitral valve is normal in structure. Trivial mitral valve  regurgitation. No evidence of mitral stenosis. Moderate mitral annular  calcification.   5. The aortic valve is calcified. Aortic valve regurgitation is not  visualized. Mild aortic valve stenosis.   6. The inferior vena cava is normal in size with greater than 50%  respiratory variability, suggesting right  atrial pressure of 3 mmHg.   Patient Profile     75 y.o. male with HTN, DM who presented after witnessed OHCA. Successfully defib x 1 by EMS with ROSC.  Assessment & Plan    Cardiac arrest   -High-sensitivity troponin peaked at 1177.  Treated with IV heparin.  Plan for cardiac catheterization later today.  Echocardiogram showed LV function of 55 to 60% with mild hypokinesis of the distal septum.  Grade 2 diastolic dysfunction. -Per Dr. Quentin Ore, patient will need ICD prior to discharge if no evidence of significant CAD. - Unfortunately, he is not on daily aspirin.  Precath aspirin given. - Hemoglobin dropping down, see below. - Continue Crestor 20 mg.  See below. - We will plan to add beta-blocker.  2.  Junctional bradycardia -Occurred with sedation during early admission, now resolved -We will continue beta-blocker prior to discharge  3.  Hypertension -Blood pressure stable on amlodipine  4.  Anemia - Results for Brian, Robinson (MRN [id number]) as of 09/20/2020 07:57  Ref. Range 09/17/2020 01:53 09/17/2020 03:36 09/18/2020 00:23 09/19/2020 03:44 09/20/2020 04:31  Hemoglobin Latest Ref Range: 13.0 - 17.0 g/dL 11.1 (L) 11.6 (L) 10.9 (L) 8.9 (L) 8.4 (L)  -Patient denies any bleeding but no bowel movement since admission.  Patient denies prior history of bleeding. -We will give stool softener and check stool guaiac. Updated nurse.  -Further work-up by primary team   5.  Elevated LFTs -AST was up and improved now trending up -Continue to trend -May need to hold Crestor   For questions or updates, please contact [place] Please consult [email or web address] for contact info under   SignedLeanor Kail, PA  09/20/2020, 7:53 AM     Patient seen, examined. Available data reviewed. Agree with findings, assessment, and plan as outlined by Robbie Lis, PA. On my exam this am: Vitals:   09/20/20 0022 09/20/20 0501  BP: 123/64 120/61  Pulse: 89 88  Resp: 18 18  Temp: 99.5 F (37.5  C) 99 F (37.2 C)  SpO2: 100% 95%   Pt is alert and oriented, NAD HEENT: normal Neck: JVP - normal Lungs: CTA bilaterally CV: RRR with 2/6 systolic murmur at the LLSB Abd: soft, NT, Positive BS, no hepatomegaly Ext: trace edema left lower leg, distal pulses intact and equal Skin: warm/dry no rash  Wife at bedside and other family on speaker phone. Pertinent findings reviewed - LVEF 55-60%, HS-trop 155-->1177-->831-->665. Recommend cardiac cath +/- PCI today as indicated. I have reviewed the risks, indications, and alternatives to cardiac catheterization, possible angioplasty, and stenting with the patient. Risks include but are not limited to bleeding, infection, vascular injury,  stroke, myocardial infection, arrhythmia, kidney injury, radiation-related injury in the case of prolonged fluoroscopy use, emergency cardiac surgery, and death. The patient understands the risks of serious complication is 1-2 in 3845 with diagnostic cardiac cath and 1-2% or less with angioplasty/stenting.  Reviewed patient's HgB trend as outlined above. I suspect this is related to acute illness, hemodilution, and phlebotomy in a patient with OOH cardiac arrest. He has had no signs of bleeding. Reports anemia as OP about 3 months ago and sent stool studies at that time negative for blood in stool. Will need to make decision regarding PCI ad hoc. Advised that if clear culprit lesion found with signs of critical stenosis, would favor moving forward with PCI. If not, would defer and proceed with medical therapy, EP consultation for ICD, and further evaluation of anemia. They understand - all questions answered. Further plans/disposition pending cath results.   Sherren Mocha, M.D. 09/20/2020 8:37 AM

## 2020-09-20 NOTE — Progress Notes (Signed)
PROGRESS NOTE    Brian Robinson  ZOX:[id number] DOB: [birth year] DOA: 09/16/2020 PCP: Seward Carol, MD   Brief Narrative: 75 year old Caucasian male with a past medical history of hypertension, type 2 diabetes mellitus presented to the hospital after having out of spittle cardiac arrest at home.  EMS was contacted by me and the patient had 1 round of CPR.  With noted shockable rhythm and shock x1 with return of spontaneous circulation.  On EMS arrival to the emergency department he remained obtunded and therefore was intubated for airway protection.  He admitted to the ICU and extubated on 09/17/20.  Downgraded to telemetry yesterday.  Hospitalist team has assumed care starting 09/19/20.   Subjective: No acute events reported overnight.  Patient seen and examined with family at bedside.  Postarrest encephalopathy is improving but still having some occasional short-term memory loss per family.  No underlying dementia.  Denies any chest pain or heart palpitations. Just underwent LHC early this morning and will need CABG.    Assessment & Plan:   Active Problems:   Cardiac arrest [facility])   Out of hospital cardiac arrest with concern for VT/VF -Echocardiogram showed EF that was normal.  Cardiology following in consultation. -Left heart cath done. Will need CABG. Planned in next few days.  Acute on chronic anemia -FOBT ordered. Hgb 11.1>11.6>10.9>8.9>8.4 -iron studies ordered -no overt bleeding noted -Heparin drip switched to Lovenox VTE PPX dosing post-cath for concern for GI bleed? Touch base with them if FOBT negative.  NSTEMI -Continue aspirin, statin  Postarrest encephalopathy -improving  Bilateral rib fractures from CPR -Continue ICS and will order lidocaine patches  Essential hypertension -cont amlodipine  Dyslipidemia -Continue statin  Depression -Continue home sertraline  DVT prophylaxis: Lovenox Code Status: Full code Family Communication: Family was present at  bedside upon my evaluation and all questions were answered Disposition:   Status is: Inpatient  Remains inpatient appropriate because:Ongoing diagnostic testing needed not appropriate for outpatient work up  Dispo: The patient is from: Home              Anticipated d/c is to: Home              Patient currently is not medically stable to d/c.   Difficult to place patient No        Consultants:  Cardiology     Objective: Vitals:   09/20/20 0959 09/20/20 1027 09/20/20 1102 09/20/20 1144  BP: (!) 124/59 122/71 109/64 126/64  Pulse: 87 100  88  Resp: (!) 48 20    Temp:  98.5 F (36.9 C) 98.5 F (36.9 C)   TempSrc:  Oral Oral   SpO2: 100% 95% 92%   Weight:      Height:        Intake/Output Summary (Last 24 hours) at 09/20/2020 1535 Last data filed at 09/20/2020 0500 Gross per 24 hour  Intake 560.63 ml  Output 325 ml  Net 235.63 ml    Filed Weights   09/19/20 0300 09/20/20 0058 09/20/20 0500  Weight: 84.1 kg 85.6 kg 86.6 kg    Examination:  General exam: Appears calm and comfortable  Respiratory system: Clear to auscultation. Respiratory effort normal. Cardiovascular system: S1 & S2 heard, RRR.  2 out of 6 systolic ejection murmur at the left upper sternal border Gastrointestinal system: Abdomen is nondistended, soft and nontender. No organomegaly or masses felt. Normal bowel sounds heard. Central nervous system: Alert and oriented. No focal neurological deficits. Extremities: Symmetric 5 x 5 power. Skin:  No rashes, lesions or ulcers Psychiatry: Judgement and insight appear normal. Mood & affect appropriate.     Data Reviewed: I have personally reviewed following labs and imaging studies  CBC: Recent Labs  Lab 09/16/20 1726 09/16/20 1731 09/16/20 2009 09/16/20 2103 09/17/20 0153 09/17/20 0336 09/18/20 0023 09/19/20 0344 09/20/20 0431  WBC 12.9*  --  17.7*  --  13.7*  --  12.3* 14.5* 13.8*  NEUTROABS 7.2  --   --   --   --   --   --   --   --    HGB 13.3   < > 12.4*   < > 11.1* 11.6* 10.9* 8.9* 8.4*  HCT 38.9*   < > 36.0*   < > 32.2* 34.0* 32.6* 25.8* 24.9*  MCV 86.1  --  86.1  --  84.1  --  85.3 86.6 86.2  PLT 211  --  174  --  162  --  149* 152 165   < > = values in this interval not displayed.     Basic Metabolic Panel: Recent Labs  Lab 09/16/20 2238 09/17/20 0153 09/17/20 0336 09/18/20 0023 09/19/20 0344 09/20/20 0431  NA 134* 135 137 138 135 130*  K 3.3* 2.9* 3.3* 3.3* 3.8 3.5  CL 91* 96*  --  99 96* 91*  CO2 31 28  --  32 29 28  GLUCOSE 153* 148*  --  118* 139* 117*  BUN 23 22  --  22 26* 28*  CREATININE 1.20 1.09  --  0.88 0.92 0.86  CALCIUM 9.0 8.9  --  8.9 8.8* 8.8*  MG 1.8 1.5*  --  2.3 1.9 2.0  PHOS  --  3.8  --  3.6 2.8 3.5     GFR: Estimated Creatinine Clearance: 75.3 mL/min (by C-G formula based on SCr of 0.86 mg/dL).  Liver Function Tests: Recent Labs  Lab 09/16/20 1726 09/17/20 0153 09/18/20 0023 09/19/20 0344 09/20/20 0431  AST 122* 101* 68* 80* 133*  ALT 99* 71* 55* 42 42  ALKPHOS 76 64 56 54 47  BILITOT 0.9 1.2 1.0 1.1 1.3*  PROT 7.5 6.4* 6.7 6.7 6.5  ALBUMIN 4.0 3.4* 3.4* 3.1* 3.2*     CBG: Recent Labs  Lab 09/17/20 1543 09/17/20 1932 09/17/20 2339 09/18/20 0321 09/18/20 0754  GLUCAP 138* 121* 129* 127* 136*      Recent Results (from the past 240 hour(s))  Resp Panel by RT-PCR (Flu A&B, Covid) Nasopharyngeal Swab     Status: None   Collection Time: 09/16/20  6:08 PM   Specimen: Nasopharyngeal Swab; Nasopharyngeal(NP) swabs in vial transport medium  Result Value Ref Range Status   SARS Coronavirus 2 by RT PCR NEGATIVE NEGATIVE Final    Comment: (NOTE) SARS-CoV-2 target nucleic acids are NOT DETECTED.  The SARS-CoV-2 RNA is generally detectable in upper respiratory specimens during the acute phase of infection. The lowest concentration of SARS-CoV-2 viral copies this assay can detect is 138 copies/mL. A negative result does not preclude SARS-Cov-2 infection and  should not be used as the sole basis for treatment or other patient management decisions. A negative result may occur with  improper specimen collection/handling, submission of specimen other than nasopharyngeal swab, presence of viral mutation(s) within the areas targeted by this assay, and inadequate number of viral copies(<138 copies/mL). A negative result must be combined with clinical observations, patient history, and epidemiological information. The expected result is Negative.  Fact Sheet for Patients:  EntrepreneurPulse.com.au  Fact Sheet  for Healthcare Providers:  IncredibleEmployment.be  This test is no t yet approved or cleared by the [place] and  has been authorized for detection and/or diagnosis of SARS-CoV-2 by FDA under an Emergency Use Authorization (EUA). This EUA will remain  in effect (meaning this test can be used) for the duration of the COVID-19 declaration under Section 564(b)(1) of the Act, 21 U.S.C.section 360bbb-3(b)(1), unless the authorization is terminated  or revoked sooner.       Influenza A by PCR NEGATIVE NEGATIVE Final   Influenza B by PCR NEGATIVE NEGATIVE Final    Comment: (NOTE) The Xpert Xpress SARS-CoV-2/FLU/RSV plus assay is intended as an aid in the diagnosis of influenza from Nasopharyngeal swab specimens and should not be used as a sole basis for treatment. Nasal washings and aspirates are unacceptable for Xpert Xpress SARS-CoV-2/FLU/RSV testing.  Fact Sheet for Patients: EntrepreneurPulse.com.au  Fact Sheet for Healthcare Providers: IncredibleEmployment.be  This test is not yet approved or cleared by the [place] FDA and has been authorized for detection and/or diagnosis of SARS-CoV-2 by FDA under an Emergency Use Authorization (EUA). This EUA will remain in effect (meaning this test can be used) for the duration of the COVID-19 declaration  under Section 564(b)(1) of the Act, 21 U.S.C. section 360bbb-3(b)(1), unless the authorization is terminated or revoked.  Performed at [facility], [place] [address], [place], [place] [address]   MRSA Next Gen by PCR, Nasal     Status: None   Collection Time: 09/16/20  9:32 PM   Specimen: Nasal Mucosa; Nasal Swab  Result Value Ref Range Status   MRSA by PCR Next Gen NOT DETECTED NOT DETECTED Final    Comment: (NOTE) The GeneXpert MRSA Assay (FDA approved for NASAL specimens only), is one component of a comprehensive MRSA colonization surveillance program. It is not intended to diagnose MRSA infection nor to guide or monitor treatment for MRSA infections. Test performance is not FDA approved in patients less than 38 years old. Performed at [facility], [place] [address], [place], [place] [address]           Radiology Studies: CARDIAC CATHETERIZATION  Result Date: 09/20/2020 Conclusions: 1. Severe three-vessel coronary artery disease, including chronic total occlusion of proximal LAD with left-to-left collaterals, 75% ostial/proximal ramus intermedius stenosis, and sequential 75% ostial and 90% distal RCA lesions.  LCx and OM3 have mild to moderate disease of up to 40%. 2. Moderately elevated left ventricular filling pressure (LVEDP ~30 mmHg). 3. Mild aortic stenosis (peak-to-peak gradient 10-15 mmHg. Recommendations: 1. Imaged reviewed with Dr. Burt Knack during the procedure.  We will plan for cardiac surgery consultation for CABG given severe LAD and RCA disease with good distal targets in both vessels. 2. Defer reinitiation of IV heparin, as the coronary artery disease appears chronic and hemoglobin has trended down significantly since admission. 3. Aggressive secondary prevention. 4. Diurese with furosemide 40 mg IV x 1.  Further diureses to be based on urine output, volume status, and renal function. Nelva Bush, MD [facility]        Scheduled Meds:  amLODipine   5 mg Oral Daily   [START ON 09/21/2020] aspirin EC  81 mg Oral Daily   Chlorhexidine Gluconate Cloth  6 each Topical Daily   [START ON 09/21/2020] enoxaparin (LOVENOX) injection  40 mg Subcutaneous Q24H   lidocaine  1 patch Transdermal Q24H   mouth rinse  15 mL Mouth Rinse BID   pantoprazole  40 mg Oral  Daily   potassium chloride  40 mEq Oral Q4H   rosuvastatin  20 mg Oral Daily   senna-docusate  1 tablet Oral Daily   sertraline  50 mg Oral Daily   sodium chloride flush  3 mL Intravenous Q12H   Continuous Infusions:  sodium chloride 1,000 mL (09/20/20 1110)   sodium chloride       LOS: 4 days    Time spent: 35 minutes    Leslee Home, MD [facility]   To contact the attending provider between 7A-7P or the covering provider during after hours 7P-7A, please log into the web site [email or web address] and access using universal [place] password for that web site. If you do not have the password, please call the hospital operator.  09/20/2020, 3:35 PM

## 2020-09-20 NOTE — Progress Notes (Signed)
Lower extremity venous bilateral and pre-CABG studies attempted. Patient not in condition at this time due to administered medications per RN. Will attempt again as schedule and patient condition permits.   Darlin Coco, RDMS, RVT

## 2020-09-20 NOTE — Care Management Important Message (Signed)
Important Message  Patient Details  Name: Brian Robinson MRN: [id number] Date of Birth: 12/08/1945   Medicare Important Message Given:  Yes     Orbie Pyo 09/20/2020, 2:44 PM

## 2020-09-20 NOTE — Interval H&P Note (Signed)
History and Physical Interval Note:  09/20/2020 9:17 AM  Brian Robinson  has presented today for surgery, with the diagnosis of cardiac arrest.  The various methods of treatment have been discussed with the patient and family. After consideration of risks, benefits and other options for treatment, the patient has consented to  Procedure(s): LEFT HEART CATH AND CORONARY ANGIOGRAPHY (N/A) as a surgical intervention.  The patient's history has been reviewed, patient examined, no change in status, stable for surgery.  I have reviewed the patient's chart and labs.  Questions were answered to the patient's satisfaction.    Cath Lab Visit (complete for each Cath Lab visit)  Clinical Evaluation Leading to the Procedure:   ACS: Yes.    Non-ACS:  N/A  Tanise Russman

## 2020-09-20 NOTE — H&P (View-Only) (Signed)
Progress Note  Patient Name: Brian Robinson Date of Encounter: 09/20/2020  [facility] Cardiologist: New to Dr. Claiborne Billings   Subjective   Hemoglobin dropping down since admission.  No bowel movement since admit.  No chest pain or shortness of breath.  Denies prior bleeding issue.  Inpatient Medications    Scheduled Meds:  amLODipine  5 mg Oral Daily   Chlorhexidine Gluconate Cloth  6 each Topical Daily   lidocaine  1 patch Transdermal Q24H   mouth rinse  15 mL Mouth Rinse BID   rosuvastatin  20 mg Oral Daily   sertraline  50 mg Oral Daily   sodium chloride flush  3 mL Intravenous Q12H   Continuous Infusions:  sodium chloride Stopped (09/18/20 0606)   sodium chloride     sodium chloride 1 mL/kg/hr (09/20/20 0601)   heparin 1,600 Units/hr (09/19/20 2306)   PRN Meds: sodium chloride, sodium chloride, diphenhydrAMINE, docusate sodium, HYDROcodone-acetaminophen, labetalol, morphine injection, polyethylene glycol, sodium chloride flush   Vital Signs    Vitals:   09/20/20 0022 09/20/20 0058 09/20/20 0500 09/20/20 0501  BP: 123/64   120/61  Pulse: 89   88  Resp: 18   18  Temp: 99.5 F (37.5 C)   99 F (37.2 C)  TempSrc: Oral   Oral  SpO2: 100%   95%  Weight:  85.6 kg 86.6 kg   Height:        Intake/Output Summary (Last 24 hours) at 09/20/2020 0753 Last data filed at 09/20/2020 0500 Gross per 24 hour  Intake 1057.79 ml  Output 325 ml  Net 732.79 ml   Last 3 Weights 09/20/2020 09/20/2020 09/19/2020  Weight (lbs) 191 lb 0.1 oz 188 lb 12.8 oz 185 lb 4.8 oz  Weight (kg) 86.639 kg 85.639 kg 84.052 kg      Telemetry    Normal sinus rhythm- Personally Reviewed  ECG    No new tracing  Physical Exam   GEN: No acute distress.   Neck: No JVD Cardiac: RRR, no murmurs, rubs, or gallops.  Respiratory: Clear to auscultation bilaterally. GI: Soft, nontender, non-distended  MS: No edema; No deformity. Neuro:  Nonfocal  Psych: Normal affect   Labs    High Sensitivity  Troponin:   Recent Labs  Lab 09/16/20 1726 09/16/20 1927 09/17/20 0829 09/17/20 1059  TROPONINIHS 155* 1,177* 831* 665*      Chemistry Recent Labs  Lab 09/18/20 0023 09/19/20 0344 09/20/20 0431  NA 138 135 130*  K 3.3* 3.8 3.5  CL 99 96* 91*  CO2 32 29 28  GLUCOSE 118* 139* 117*  BUN 22 26* 28*  CREATININE 0.88 0.92 0.86  CALCIUM 8.9 8.8* 8.8*  PROT 6.7 6.7 6.5  ALBUMIN 3.4* 3.1* 3.2*  AST 68* 80* 133*  ALT 55* 42 42  ALKPHOS 56 54 47  BILITOT 1.0 1.1 1.3*  GFRNONAA >60 >60 >60  ANIONGAP 7 10 11      Hematology Recent Labs  Lab 09/18/20 0023 09/19/20 0344 09/20/20 0431  WBC 12.3* 14.5* 13.8*  RBC 3.82* 2.98* 2.89*  HGB 10.9* 8.9* 8.4*  HCT 32.6* 25.8* 24.9*  MCV 85.3 86.6 86.2  MCH 28.5 29.9 29.1  MCHC 33.4 34.5 33.7  RDW 13.6 13.3 13.1  PLT 149* 152 165    Radiology    No results found.  Cardiac Studies   Echo 09/17/20  1. Mild hypokinesis of the distal septum with overall preserved LV  function; calcified aortic valve with mild AS by doppler but  visually  looks moderate.   2. Left ventricular ejection fraction, by estimation, is 55 to 60%. The  left ventricle has normal function. The left ventricle demonstrates  regional wall motion abnormalities (see scoring diagram/findings for  description). There is mild left ventricular   hypertrophy. Left ventricular diastolic parameters are consistent with  Grade II diastolic dysfunction (pseudonormalization). Elevated left atrial  pressure.   3. Right ventricular systolic function is normal. The right ventricular  size is normal.   4. The mitral valve is normal in structure. Trivial mitral valve  regurgitation. No evidence of mitral stenosis. Moderate mitral annular  calcification.   5. The aortic valve is calcified. Aortic valve regurgitation is not  visualized. Mild aortic valve stenosis.   6. The inferior vena cava is normal in size with greater than 50%  respiratory variability, suggesting right  atrial pressure of 3 mmHg.   Patient Profile     75 y.o. male with HTN, DM who presented after witnessed OHCA. Successfully defib x 1 by EMS with ROSC.  Assessment & Plan    Cardiac arrest   -High-sensitivity troponin peaked at 1177.  Treated with IV heparin.  Plan for cardiac catheterization later today.  Echocardiogram showed LV function of 55 to 60% with mild hypokinesis of the distal septum.  Grade 2 diastolic dysfunction. -Per Dr. Quentin Ore, patient will need ICD prior to discharge if no evidence of significant CAD. - Unfortunately, he is not on daily aspirin.  Precath aspirin given. - Hemoglobin dropping down, see below. - Continue Crestor 20 mg.  See below. - We will plan to add beta-blocker.  2.  Junctional bradycardia -Occurred with sedation during early admission, now resolved -We will continue beta-blocker prior to discharge  3.  Hypertension -Blood pressure stable on amlodipine  4.  Anemia - Results for BERL, BONFANTI (MRN [id number]) as of 09/20/2020 07:57  Ref. Range 09/17/2020 01:53 09/17/2020 03:36 09/18/2020 00:23 09/19/2020 03:44 09/20/2020 04:31  Hemoglobin Latest Ref Range: 13.0 - 17.0 g/dL 11.1 (L) 11.6 (L) 10.9 (L) 8.9 (L) 8.4 (L)  -Patient denies any bleeding but no bowel movement since admission.  Patient denies prior history of bleeding. -We will give stool softener and check stool guaiac. Updated nurse.  -Further work-up by primary team   5.  Elevated LFTs -AST was up and improved now trending up -Continue to trend -May need to hold Crestor   For questions or updates, please contact [place] Please consult [email or web address] for contact info under   SignedLeanor Kail, PA  09/20/2020, 7:53 AM     Patient seen, examined. Available data reviewed. Agree with findings, assessment, and plan as outlined by Robbie Lis, PA. On my exam this am: Vitals:   09/20/20 0022 09/20/20 0501  BP: 123/64 120/61  Pulse: 89 88  Resp: 18 18  Temp: 99.5 F (37.5  C) 99 F (37.2 C)  SpO2: 100% 95%   Pt is alert and oriented, NAD HEENT: normal Neck: JVP - normal Lungs: CTA bilaterally CV: RRR with 2/6 systolic murmur at the LLSB Abd: soft, NT, Positive BS, no hepatomegaly Ext: trace edema left lower leg, distal pulses intact and equal Skin: warm/dry no rash  Wife at bedside and other family on speaker phone. Pertinent findings reviewed - LVEF 55-60%, HS-trop 155-->1177-->831-->665. Recommend cardiac cath +/- PCI today as indicated. I have reviewed the risks, indications, and alternatives to cardiac catheterization, possible angioplasty, and stenting with the patient. Risks include but are not limited to bleeding, infection, vascular injury,  stroke, myocardial infection, arrhythmia, kidney injury, radiation-related injury in the case of prolonged fluoroscopy use, emergency cardiac surgery, and death. The patient understands the risks of serious complication is 1-2 in 1848 with diagnostic cardiac cath and 1-2% or less with angioplasty/stenting.  Reviewed patient's HgB trend as outlined above. I suspect this is related to acute illness, hemodilution, and phlebotomy in a patient with OOH cardiac arrest. He has had no signs of bleeding. Reports anemia as OP about 3 months ago and sent stool studies at that time negative for blood in stool. Will need to make decision regarding PCI ad hoc. Advised that if clear culprit lesion found with signs of critical stenosis, would favor moving forward with PCI. If not, would defer and proceed with medical therapy, EP consultation for ICD, and further evaluation of anemia. They understand - all questions answered. Further plans/disposition pending cath results.   Sherren Mocha, M.D. 09/20/2020 8:37 AM

## 2020-09-20 NOTE — Progress Notes (Signed)
TCTS consulted for CABG evaluation. °

## 2020-09-20 NOTE — Plan of Care (Signed)
  Problem: Elimination: Goal: Will not experience complications related to bowel motility Outcome: Completed/Met

## 2020-09-20 NOTE — Progress Notes (Signed)
Patient arrived from cath lab. TR band is at [place] and was inflated at 1003. Site looks clean dry and intact with no signs of a hematoma. Patient educated on not putting pressure on the R radial while TR band is there. Patient verbalize understanding. Tele monitor has been reapplied and VSS stable. Frequent vitals have been sequences. Patient verbalizes no pain. Will continue to monitor.

## 2020-09-20 NOTE — Progress Notes (Signed)
PT Cancellation Note  Patient Details Name: Brian Robinson MRN: [id number] DOB: 1945/12/12   Cancelled Treatment:    Reason Eval/Treat Not Completed: Patient at procedure or test/unavailable. Pt in Cath Lab at this time. Will follow up another date. Acute PT to continue during pt's hospital stay.  Willow Ora, PTA, CLT Acute Rehab Services [phone] 09/20/20, 10:01 AM    Willow Ora 09/20/2020, 10:00 AM

## 2020-09-21 ENCOUNTER — Inpatient Hospital Stay (HOSPITAL_COMMUNITY): Payer: Medicare Other

## 2020-09-21 DIAGNOSIS — R609 Edema, unspecified: Secondary | ICD-10-CM

## 2020-09-21 DIAGNOSIS — Z01810 Encounter for preprocedural cardiovascular examination: Secondary | ICD-10-CM

## 2020-09-21 DIAGNOSIS — G9340 Encephalopathy, unspecified: Secondary | ICD-10-CM

## 2020-09-21 DIAGNOSIS — M7989 Other specified soft tissue disorders: Secondary | ICD-10-CM

## 2020-09-21 LAB — CBC
HCT: 26.7 % — ABNORMAL LOW (ref 39.0–52.0)
Hemoglobin: 9.2 g/dL — ABNORMAL LOW (ref 13.0–17.0)
MCH: 29.9 pg (ref 26.0–34.0)
MCHC: 34.5 g/dL (ref 30.0–36.0)
MCV: 86.7 fL (ref 80.0–100.0)
Platelets: 196 10*3/uL (ref 150–400)
RBC: 3.08 MIL/uL — ABNORMAL LOW (ref 4.22–5.81)
RDW: 13.5 % (ref 11.5–15.5)
WBC: 16.3 10*3/uL — ABNORMAL HIGH (ref 4.0–10.5)
nRBC: 0.2 % (ref 0.0–0.2)

## 2020-09-21 LAB — COMPREHENSIVE METABOLIC PANEL
ALT: 43 U/L (ref 0–44)
AST: 131 U/L — ABNORMAL HIGH (ref 15–41)
Albumin: 3.3 g/dL — ABNORMAL LOW (ref 3.5–5.0)
Alkaline Phosphatase: 50 U/L (ref 38–126)
Anion gap: 12 (ref 5–15)
BUN: 29 mg/dL — ABNORMAL HIGH (ref 8–23)
CO2: 26 mmol/L (ref 22–32)
Calcium: 8.9 mg/dL (ref 8.9–10.3)
Chloride: 94 mmol/L — ABNORMAL LOW (ref 98–111)
Creatinine, Ser: 1.09 mg/dL (ref 0.61–1.24)
GFR, Estimated: >60 mL/min (ref >60)
Glucose, Bld: 138 mg/dL — ABNORMAL HIGH (ref 70–99)
Potassium: 3.8 mmol/L (ref 3.5–5.1)
Sodium: 132 mmol/L — ABNORMAL LOW (ref 135–145)
Total Bilirubin: 1.4 mg/dL — ABNORMAL HIGH (ref 0.3–1.2)
Total Protein: 7 g/dL (ref 6.5–8.1)

## 2020-09-21 LAB — URINALYSIS, ROUTINE W REFLEX MICROSCOPIC
Bilirubin Urine: NEGATIVE
Glucose, UA: NEGATIVE mg/dL
Ketones, ur: NEGATIVE mg/dL
Nitrite: NEGATIVE
Protein, ur: NEGATIVE mg/dL
Specific Gravity, Urine: 1.012 (ref 1.005–1.030)
pH: 5 (ref 5.0–8.0)

## 2020-09-21 LAB — IRON AND TIBC
Iron: 29 ug/dL — ABNORMAL LOW (ref 45–182)
Saturation Ratios: 9 % — ABNORMAL LOW (ref 17.9–39.5)
TIBC: 308 ug/dL (ref 250–450)
UIBC: 279 ug/dL

## 2020-09-21 LAB — PHOSPHORUS: Phosphorus: 3.1 mg/dL (ref 2.5–4.6)

## 2020-09-21 LAB — HEPARIN LEVEL (UNFRACTIONATED)
Heparin Unfractionated: <0.1 IU/mL — ABNORMAL LOW (ref 0.30–0.70)
Heparin Unfractionated: 0.39 IU/mL (ref 0.30–0.70)

## 2020-09-21 LAB — MAGNESIUM: Magnesium: 1.8 mg/dL (ref 1.7–2.4)

## 2020-09-21 LAB — FERRITIN: Ferritin: 318 ng/mL (ref 24–336)

## 2020-09-21 MED ORDER — SODIUM CHLORIDE 0.9 % IV SOLN
1.0000 g | INTRAVENOUS | Status: DC
  Administered 2020-09-21 – 2020-09-23 (×3): 1 g via INTRAVENOUS
  Filled 2020-09-21 (×2): qty 10
  Filled 2020-09-21: qty 1
  Filled 2020-09-21: qty 10

## 2020-09-21 MED ORDER — FUROSEMIDE 10 MG/ML IJ SOLN
40.0000 mg | Freq: Once | INTRAMUSCULAR | Status: AC
  Administered 2020-09-21: 40 mg via INTRAVENOUS
  Filled 2020-09-21: qty 4

## 2020-09-21 MED ORDER — POTASSIUM CHLORIDE CRYS ER 20 MEQ PO TBCR
20.0000 meq | EXTENDED_RELEASE_TABLET | Freq: Once | ORAL | Status: AC
  Administered 2020-09-21: 20 meq via ORAL
  Filled 2020-09-21: qty 1

## 2020-09-21 MED ORDER — MAGNESIUM OXIDE -MG SUPPLEMENT 400 (240 MG) MG PO TABS
400.0000 mg | ORAL_TABLET | Freq: Two times a day (BID) | ORAL | Status: DC
  Administered 2020-09-21 – 2020-09-22 (×4): 400 mg via ORAL
  Filled 2020-09-21 (×4): qty 1

## 2020-09-21 MED ORDER — ENSURE ENLIVE PO LIQD
237.0000 mL | Freq: Two times a day (BID) | ORAL | Status: DC
  Administered 2020-09-21 – 2020-09-22 (×3): 237 mL via ORAL

## 2020-09-21 NOTE — Progress Notes (Signed)
Progress Note  Patient Name: Brian Robinson Date of Encounter: 09/21/2020  [facility] Cardiologist: None   Subjective   No chest pain, some shortness of breath reported.   Inpatient Medications    Scheduled Meds:  amLODipine  5 mg Oral Daily   aspirin EC  81 mg Oral Daily   Chlorhexidine Gluconate Cloth  6 each Topical Daily   lidocaine  1 patch Transdermal Q24H   mouth rinse  15 mL Mouth Rinse BID   pantoprazole  40 mg Oral Daily   rosuvastatin  20 mg Oral Daily   senna-docusate  1 tablet Oral Daily   sertraline  50 mg Oral Daily   sodium chloride flush  3 mL Intravenous Q12H   Continuous Infusions:  sodium chloride 1,000 mL (09/20/20 1110)   sodium chloride     heparin 1,750 Units/hr (09/21/20 1046)   PRN Meds: sodium chloride, sodium chloride, acetaminophen, diphenhydrAMINE, docusate sodium, HYDROcodone-acetaminophen, labetalol, morphine injection, ondansetron (ZOFRAN) IV, polyethylene glycol, sodium chloride flush   Vital Signs    Vitals:   09/20/20 2006 09/21/20 0407 09/21/20 0500 09/21/20 0839  BP: 112/78 125/67  113/60  Pulse: 88 (!) 103  89  Resp: 18 20  18   Temp: 99.1 F (37.3 C) (!) 100.4 F (38 C)  98.3 F (36.8 C)  TempSrc: Oral Oral  Oral  SpO2: 94% 99%  95%  Weight:   86.6 kg   Height:        Intake/Output Summary (Last 24 hours) at 09/21/2020 1055 Last data filed at 09/21/2020 1046 Gross per 24 hour  Intake 613.37 ml  Output 220 ml  Net 393.37 ml   Last 3 Weights 09/21/2020 09/20/2020 09/20/2020  Weight (lbs) 191 lb 191 lb 0.1 oz 188 lb 12.8 oz  Weight (kg) 86.637 kg 86.639 kg 85.639 kg       Physical Exam  Alert, oriented, NAD GEN: No acute distress.   Neck: No JVD Cardiac: RRR, 2/6 systolic murmur heard throughout Respiratory: Clear to auscultation bilaterally. GI: Soft, nontender, non-distended  MS: left leg 1+ edema; No deformity. Neuro:  Nonfocal  Psych: Normal affect   Labs    High Sensitivity Troponin:   Recent Labs   Lab 09/16/20 1726 09/16/20 1927 09/17/20 0829 09/17/20 1059  TROPONINIHS 155* 1,177* 831* 665*      Chemistry Recent Labs  Lab 09/19/20 0344 09/20/20 0431 09/21/20 0219  NA 135 130* 132*  K 3.8 3.5 3.8  CL 96* 91* 94*  CO2 29 28 26   GLUCOSE 139* 117* 138*  BUN 26* 28* 29*  CREATININE 0.92 0.86 1.09  CALCIUM 8.8* 8.8* 8.9  PROT 6.7 6.5 7.0  ALBUMIN 3.1* 3.2* 3.3*  AST 80* 133* 131*  ALT 42 42 43  ALKPHOS 54 47 50  BILITOT 1.1 1.3* 1.4*  GFRNONAA >60 >60 >60  ANIONGAP 10 11 12      Hematology Recent Labs  Lab 09/19/20 0344 09/20/20 0431 09/21/20 0219  WBC 14.5* 13.8* 16.3*  RBC 2.98* 2.89* 3.08*  HGB 8.9* 8.4* 9.2*  HCT 25.8* 24.9* 26.7*  MCV 86.6 86.2 86.7  MCH 29.9 29.1 29.9  MCHC 34.5 33.7 34.5  RDW 13.3 13.1 13.5  PLT 152 165 196    BNPNo results for input(s): BNP, PROBNP in the last 168 hours.   DDimer No results for input(s): DDIMER in the last 168 hours.   Radiology    CARDIAC CATHETERIZATION  Result Date: 09/20/2020 Conclusions: 1. Severe three-vessel coronary artery disease, including chronic  total occlusion of proximal LAD with left-to-left collaterals, 75% ostial/proximal ramus intermedius stenosis, and sequential 75% ostial and 90% distal RCA lesions.  LCx and OM3 have mild to moderate disease of up to 40%. 2. Moderately elevated left ventricular filling pressure (LVEDP ~30 mmHg). 3. Mild aortic stenosis (peak-to-peak gradient 10-15 mmHg. Recommendations: 1. Imaged reviewed with Dr. Burt Knack during the procedure.  We will plan for cardiac surgery consultation for CABG given severe LAD and RCA disease with good distal targets in both vessels. 2. Defer reinitiation of IV heparin, as the coronary artery disease appears chronic and hemoglobin has trended down significantly since admission. 3. Aggressive secondary prevention. 4. Diurese with furosemide 40 mg IV x 1.  Further diureses to be based on urine output, volume status, and renal function.  Nelva Bush, MD [facility]   DG CHEST PORT 1 VIEW  Result Date: 09/21/2020 CLINICAL DATA:  75 year old male with fever. Shortness of breath and chest pain. EXAM: PORTABLE CHEST 1 VIEW COMPARISON:  Portable chest 09/17/2020 and earlier. FINDINGS: Portable AP semi upright view at 0802 hours. Extubated and enteric tube removed. Stable lung volumes. Normal cardiac size and mediastinal contours. Visualized tracheal air column is within normal limits. Right upper lobe calcified granuloma. Otherwise Allowing for portable technique the lungs are clear. No pneumothorax. No acute osseous abnormality identified. Negative visible bowel gas pattern. IMPRESSION: 1. Extubated and enteric tube removed. 2. No acute cardiopulmonary abnormality. Electronically Signed   By: Genevie Ann M.D.   On: 09/21/2020 08:11   VAS US DOPPLER PRE CABG  Result Date: 09/21/2020 PREOPERATIVE VASCULAR EVALUATION Patient Name:  PACEY ALTIZER  Date of Exam:   09/21/2020 Medical Rec #: [id number]       Accession #:    [id number] Date of Birth: 28-Mar-1945        Patient Gender: M Patient Age:   75Y Exam Location:  [facility] Procedure:      VAS US DOPPLER PRE CABG Referring Phys: [place] --------------------------------------------------------------------------------  Indications:      Pre-CABG. Risk Factors:     Hypertension, Diabetes. Comparison Study: no prior Performing Technologist: Archie Patten RVS  Examination Guidelines: A complete evaluation includes B-mode imaging, spectral Doppler, color Doppler, and power Doppler as needed of all accessible portions of each vessel. Bilateral testing is considered an integral part of a complete examination. Limited examinations for reoccurring indications may be performed as noted.  Right Carotid Findings: +----------+-------+-------+--------+---------------------------------+--------+           PSV    EDV    StenosisDescribe                         Comments            cm/s   cm/s                                                     +----------+-------+-------+--------+---------------------------------+--------+ CCA Prox  108    13             heterogenous                              +----------+-------+-------+--------+---------------------------------+--------+ CCA Distal48     13  heterogenous                              +----------+-------+-------+--------+---------------------------------+--------+ ICA Prox  127    50     40-59%  heterogenous, irregular and                                               calcific                                  +----------+-------+-------+--------+---------------------------------+--------+ ICA Mid   137    39                                                       +----------+-------+-------+--------+---------------------------------+--------+ ICA Distal93     23                                                       +----------+-------+-------+--------+---------------------------------+--------+ ECA       84                                                              +----------+-------+-------+--------+---------------------------------+--------+ Portions of this table do not appear on this page. +----------+--------+-------+--------+------------+           PSV cm/sEDV cmsDescribeArm Pressure +----------+--------+-------+--------+------------+ Subclavian74                                  +----------+--------+-------+--------+------------+ +---------+--------+--+--------+--+---------+ VertebralPSV cm/s41EDV cm/s15Antegrade +---------+--------+--+--------+--+---------+ Left Carotid Findings: +----------+--------+--------+--------+------------+--------+           PSV cm/sEDV cm/sStenosisDescribe    Comments +----------+--------+--------+--------+------------+--------+ CCA Prox  86      20              heterogenous          +----------+--------+--------+--------+------------+--------+ CCA Distal62      14              heterogenous         +----------+--------+--------+--------+------------+--------+ ICA Prox  77      24      1-39%   heterogenoustortuous +----------+--------+--------+--------+------------+--------+ ICA Distal71      21                                   +----------+--------+--------+--------+------------+--------+ ECA       90      13                                   +----------+--------+--------+--------+------------+--------+ +----------+--------+--------+--------+------------+ SubclavianPSV cm/sEDV cm/sDescribeArm Pressure +----------+--------+--------+--------+------------+  74                                   +----------+--------+--------+--------+------------+ +---------+--------+--+--------+--+---------+ VertebralPSV cm/s43EDV cm/s12Antegrade +---------+--------+--+--------+--+---------+  ABI Findings: +--------+------------------+-----+---------+--------+ Right   Rt Pressure (mmHg)IndexWaveform Comment  +--------+------------------+-----+---------+--------+ [id number]                    triphasic         +--------+------------------+-----+---------+--------+ PTA     255               2.06 triphasic         +--------+------------------+-----+---------+--------+ DP      161               1.30 triphasic         +--------+------------------+-----+---------+--------+ +--------+------------------+-----+---------+-------+ Left    Lt Pressure (mmHg)IndexWaveform Comment +--------+------------------+-----+---------+-------+ [id number]                    triphasic        +--------+------------------+-----+---------+-------+ PTA     255               2.06 triphasic        +--------+------------------+-----+---------+-------+ DP      251               2.02 triphasic         +--------+------------------+-----+---------+-------+ +-------+---------------+----------------+ ABI/TBIToday's ABI/TBIPrevious ABI/TBI +-------+---------------+----------------+ Right  2.06                            +-------+---------------+----------------+ Left   2.06                            +-------+---------------+----------------+  Right Doppler Findings: +--------+--------+-----+---------+--------+ Site    PressureIndexDoppler  Comments +--------+--------+-----+---------+--------+ [id number]          triphasic         +--------+--------+-----+---------+--------+ Radial               triphasic         +--------+--------+-----+---------+--------+ Ulnar                triphasic         +--------+--------+-----+---------+--------+  Left Doppler Findings: +--------+--------+-----+---------+--------+ Site    PressureIndexDoppler  Comments +--------+--------+-----+---------+--------+ [id number]          triphasic         +--------+--------+-----+---------+--------+ Radial               triphasic         +--------+--------+-----+---------+--------+ Ulnar                triphasic         +--------+--------+-----+---------+--------+  Summary: Right Carotid: Velocities in the right ICA are consistent with a 40-59%                stenosis. Left Carotid: Velocities in the left ICA are consistent with a 1-39% stenosis. Vertebrals: Bilateral vertebral arteries demonstrate antegrade flow. Right ABI: Resting right ankle-brachial index indicates noncompressible right lower extremity arteries. Left ABI: Resting left ankle-brachial index indicates noncompressible left lower extremity arteries. Right Upper Extremity: Doppler waveforms remain within normal limits with right radial compression. Doppler waveforms decrease >50% with right ulnar compression. Left Upper Extremity: Doppler waveform obliterate with left radial compression. Doppler waveforms decrease 50%  with left ulnar compression.  Preliminary    VAS [place] LOWER EXTREMITY VENOUS (DVT)  Result Date: 09/21/2020  Lower Venous DVT Study Patient Name:  CORDARIOUS ZEEK  Date of Exam:   09/21/2020 Medical Rec #: [id number]       Accession #:    [id number] Date of Birth: 07/26/1945        Patient Gender: M Patient Age:   75Y Exam Location:  [facility] Procedure:      VAS [place] LOWER EXTREMITY VENOUS (DVT) Referring Phys: [id number] [place] --------------------------------------------------------------------------------  Indications: Swelling, and Edema.  Comparison Study: no prior Performing Technologist: Archie Patten RVS  Examination Guidelines: A complete evaluation includes B-mode imaging, spectral Doppler, color Doppler, and power Doppler as needed of all accessible portions of each vessel. Bilateral testing is considered an integral part of a complete examination. Limited examinations for reoccurring indications may be performed as noted. The reflux portion of the exam is performed with the patient in reverse Trendelenburg.  +---------+---------------+---------+-----------+----------+--------------+ RIGHT    CompressibilityPhasicitySpontaneityPropertiesThrombus Aging +---------+---------------+---------+-----------+----------+--------------+ CFV      Full           Yes      Yes                                 +---------+---------------+---------+-----------+----------+--------------+ SFJ      Full                                                        +---------+---------------+---------+-----------+----------+--------------+ FV Prox  Full                                                        +---------+---------------+---------+-----------+----------+--------------+ FV Mid   Full                                                        +---------+---------------+---------+-----------+----------+--------------+ FV DistalFull                                                         +---------+---------------+---------+-----------+----------+--------------+ PFV      Full                                                        +---------+---------------+---------+-----------+----------+--------------+ POP      Full           Yes      Yes                                 +---------+---------------+---------+-----------+----------+--------------+ PTV  Full                                                        +---------+---------------+---------+-----------+----------+--------------+ PERO     Full                                                        +---------+---------------+---------+-----------+----------+--------------+   +---------+---------------+---------+-----------+----------+--------------+ LEFT     CompressibilityPhasicitySpontaneityPropertiesThrombus Aging +---------+---------------+---------+-----------+----------+--------------+ CFV      Full           Yes      Yes                                 +---------+---------------+---------+-----------+----------+--------------+ SFJ      Full                                                        +---------+---------------+---------+-----------+----------+--------------+ FV Prox  Full                                                        +---------+---------------+---------+-----------+----------+--------------+ FV Mid   Full                                                        +---------+---------------+---------+-----------+----------+--------------+ FV DistalFull                                                        +---------+---------------+---------+-----------+----------+--------------+ PFV      Full                                                        +---------+---------------+---------+-----------+----------+--------------+ POP      Full           Yes      Yes                                  +---------+---------------+---------+-----------+----------+--------------+ PTV      Full                                                        +---------+---------------+---------+-----------+----------+--------------+  PERO     Full                                                        +---------+---------------+---------+-----------+----------+--------------+     Summary: BILATERAL: - No evidence of deep vein thrombosis seen in the lower extremities, bilaterally. -No evidence of popliteal cyst, bilaterally.   *See table(s) above for measurements and observations.    Preliminary     Cardiac Studies   See above  Patient Profile     75 y.o. male with HTN, DM who presented after witnessed OHCA. Successfully defib x 1 by EMS with ROSC.  Assessment & Plan    Out of hospital ventricular fibrillation cardiac arrest Junctional bradycardia, resolved Multivessel coronary artery disease, pending CABG 09/23/2020 Mild to moderate aortic stenosis Anoxic encephalopathy, improving Anemia, stable: Fecal occult blood testing negative Left leg swelling, preliminary left leg ultrasound negative for DVT  The patient is clinically stable.  Appreciate Dr. Leonarda Salon evaluation with plans for multivessel CABG later this week.  Reviewed 2D echo images.  Patient has mild aortic stenosis with a mean transvalvular gradient of 14 mmHg.  2D imaging of the aortic valve shows heavy calcification especially along the right coronary cusp.  Discussed findings with Dr. Roxan Hockey.  We will need to weigh additional surgical risk in this patient with anoxic encephalopathy versus potential benefit of valve replacement concomitant with CABG.  Continue IV heparin and current medical therapy.  Preliminary DVT study negative.    For questions or updates, please contact [place] Please consult [email or web address] for contact info under        Signed, Sherren Mocha, MD  09/21/2020, 10:55 AM

## 2020-09-21 NOTE — Progress Notes (Signed)
   09/21/20 1400  Mechanical VTE Prophylaxis (All Areas)  Mechanical VTE Prophylaxis Antiembolism stockings, knee (TED hose)  Mechanical VTE Prophylaxis Intervention On  TED Hose - Date Prophylactic Dressing Applied (if applicable) 82/95/62

## 2020-09-21 NOTE — Progress Notes (Addendum)
PROGRESS NOTE    Brian Robinson  WER:[id number] DOB: Feb 05, 1946 DOA: 09/16/2020 PCP: Seward Carol, MD   Brief Narrative: 75 year old with past medical history significant for hypertension, type 2 diabetes, presented to the hospital after having a witness cardiac arrest at home.  EMS was contacted, patient had 1 round of CPR, he had a shockable rhythm and received 1 shock and return spontaneous circulation.  On arrival to the ED patient remained obtunded, and therefore was intubated for airway protection.  He was admitted to the ICU and extubated on 09/17/2020.  His care was transferred to the hospitalist team on 09/19/2020.  Patient underwent cardiac cath on 6/27, he ruled in for MI.  Heart cath with severe three-vessel coronary artery disease, including chronic total occlusion of proximal LAD with left to left collaterals, 75% ostial proximal ramus intermedius stenosis and sequential 75% ostial and 90% distal RCA lesions.  LCx and OM 3 have mild to moderate disease of up to 40%. CVTS has been consulted, for evaluation for CABG. Plan for CABG at the end of the week.   Assessment & Plan:   Active Problems:   Cardiac arrest [facility])  1-Out of Hospital Cardiac Arrest with concern for V. tach V. Fib: -Patient ruled in for MI, cath showed severe 3 vessels coronary artery disease. -ECHO; Normal EF.  -On IV heparin.  -Cardiology and CVTS following.  -will start oral magnesium, Mg 1.8.   2-Acute on chronic anemia;  FOBT negative.  Iron deficiency.  He denies melena.  Plan to start oral iron when infection is ruled out.   3-Leukocytosis, Low grade Fever:  -Chest x ray 6-28: negative for PNA>  -LE without redness. Doppler negative for DVT.  -Check UA.  -Incentive spirometry.  -UA with 6-10 WBC. Start empirically ceftriaxone.   4-Left LE edema;  -Doppler negative for DVT.  -TED hose.  -ensure to improve nutrition. -no redness.  -IV lasix time one.   5-NSTEMI;  Cardiology following.   Cath showed severe three vessel diseases. Needs CABG.   HTN; Continue with Norvasc.   Dyslipidemia; Continue with statins.   Depression: Continue with sertraline.   Mild elevation AST; monitor.  Hyponatremia; monitor on lasix.   Anoxic Encephalopathy; post cardiac arrest.  Stable.  EEG negative.      Estimated body mass index is 30.83 kg/m as calculated from the following:   Height as of this encounter: 5\' 6"  (1.676 m).   Weight as of this encounter: 86.6 kg.   DVT prophylaxis: Heparin gtt Code Status: Full code Family Communication: care discussed with wife, daughter on phone, and son on phone Disposition Plan:  Status is: Inpatient  Remains inpatient appropriate because:IV treatments appropriate due to intensity of illness or inability to take PO  Dispo: The patient is from: Home              Anticipated d/c is to:  to be determine.               Patient currently is not medically stable to d/c.   Difficult to place patient No        Consultants:  Cardiology CVTS  Procedures:  Cath; Severe three-vessel coronary artery disease, including chronic total occlusion of proximal LAD with left-to-left collaterals, 75% ostial/proximal ramus intermedius stenosis, and sequential 75% ostial and 90% distal RCA lesions.  LCx and OM3 have mild to moderate disease of up to 40%. Moderately elevated left ventricular filling pressure (LVEDP ~30 mmHg). Mild aortic stenosis (peak-to-peak gradient 10-15  mmHg.   Antimicrobials:    Subjective: He is breathing ok.  Left LE with edema, no redness on my evaluation.    Objective: Vitals:   09/20/20 1144 09/20/20 2006 09/21/20 0407 09/21/20 0500  BP: 126/64 112/78 125/67   Pulse: 88 88 (!) 103   Resp:  18 20   Temp:  99.1 F (37.3 C) (!) 100.4 F (38 C)   TempSrc:  Oral Oral   SpO2:  94% 99%   Weight:    86.6 kg  Height:        Intake/Output Summary (Last 24 hours) at 09/21/2020 0744 Last data filed at 09/21/2020  0421 Gross per 24 hour  Intake 613.37 ml  Output 100 ml  Net 513.37 ml   Filed Weights   09/20/20 0058 09/20/20 0500 09/21/20 0500  Weight: 85.6 kg 86.6 kg 86.6 kg    Examination:  General exam: Appears calm and comfortable  Respiratory system: Clear to auscultation. Respiratory effort normal. Cardiovascular system: S1 & S2 heard, RRR. No JVD, murmurs, rubs, gallops or clicks. No pedal edema. Gastrointestinal system: Abdomen is nondistended, soft and nontender. No organomegaly or masses felt. Normal bowel sounds heard. Central nervous system: Alert and oriented. No focal neurological deficits. Extremities: Symmetric 5 x 5 power. Left LE with edema Skin: no redness.     Data Reviewed: I have personally reviewed following labs and imaging studies  CBC: Recent Labs  Lab 09/16/20 1726 09/16/20 1731 09/17/20 0153 09/17/20 0336 09/18/20 0023 09/19/20 0344 09/20/20 0431 09/21/20 0219  WBC 12.9*   < > 13.7*  --  12.3* 14.5* 13.8* 16.3*  NEUTROABS 7.2  --   --   --   --   --   --   --   HGB 13.3   < > 11.1* 11.6* 10.9* 8.9* 8.4* 9.2*  HCT 38.9*   < > 32.2* 34.0* 32.6* 25.8* 24.9* 26.7*  MCV 86.1   < > 84.1  --  85.3 86.6 86.2 86.7  PLT 211   < > 162  --  149* 152 165 196   < > = values in this interval not displayed.   Basic Metabolic Panel: Recent Labs  Lab 09/17/20 0153 09/17/20 0336 09/18/20 0023 09/19/20 0344 09/20/20 0431 09/21/20 0219  NA 135 137 138 135 130* 132*  K 2.9* 3.3* 3.3* 3.8 3.5 3.8  CL 96*  --  99 96* 91* 94*  CO2 28  --  32 29 28 26   GLUCOSE 148*  --  118* 139* 117* 138*  BUN 22  --  22 26* 28* 29*  CREATININE 1.09  --  0.88 0.92 0.86 1.09  CALCIUM 8.9  --  8.9 8.8* 8.8* 8.9  MG 1.5*  --  2.3 1.9 2.0 1.8  PHOS 3.8  --  3.6 2.8 3.5 3.1   GFR: Estimated Creatinine Clearance: 59.4 mL/min (by C-G formula based on SCr of 1.09 mg/dL). Liver Function Tests: Recent Labs  Lab 09/17/20 0153 09/18/20 0023 09/19/20 0344 09/20/20 0431  09/21/20 0219  AST 101* 68* 80* 133* 131*  ALT 71* 55* 42 42 43  ALKPHOS 64 56 54 47 50  BILITOT 1.2 1.0 1.1 1.3* 1.4*  PROT 6.4* 6.7 6.7 6.5 7.0  ALBUMIN 3.4* 3.4* 3.1* 3.2* 3.3*   Recent Labs  Lab 09/16/20 1726  LIPASE 54*   No results for input(s): AMMONIA in the last 168 hours. Coagulation Profile: Recent Labs  Lab 09/16/20 1726  INR 1.0   Cardiac Enzymes:  No results for input(s): CKTOTAL, CKMB, CKMBINDEX, TROPONINI in the last 168 hours. BNP (last 3 results) No results for input(s): PROBNP in the last 8760 hours. HbA1C: No results for input(s): HGBA1C in the last 72 hours. CBG: Recent Labs  Lab 09/17/20 1543 09/17/20 1932 09/17/20 2339 09/18/20 0321 09/18/20 0754  GLUCAP 138* 121* 129* 127* 136*   Lipid Profile: No results for input(s): CHOL, HDL, LDLCALC, TRIG, CHOLHDL, LDLDIRECT in the last 72 hours. Thyroid Function Tests: No results for input(s): TSH, T4TOTAL, FREET4, T3FREE, THYROIDAB in the last 72 hours. Anemia Panel: Recent Labs    09/21/20 0219  FERRITIN 318  TIBC 308  IRON 29*   Sepsis Labs: No results for input(s): PROCALCITON, LATICACIDVEN in the last 168 hours.  Recent Results (from the past 240 hour(s))  Resp Panel by RT-PCR (Flu A&B, Covid) Nasopharyngeal Swab     Status: None   Collection Time: 09/16/20  6:08 PM   Specimen: Nasopharyngeal Swab; Nasopharyngeal(NP) swabs in vial transport medium  Result Value Ref Range Status   SARS Coronavirus 2 by RT PCR NEGATIVE NEGATIVE Final    Comment: (NOTE) SARS-CoV-2 target nucleic acids are NOT DETECTED.  The SARS-CoV-2 RNA is generally detectable in upper respiratory specimens during the acute phase of infection. The lowest concentration of SARS-CoV-2 viral copies this assay can detect is 138 copies/mL. A negative result does not preclude SARS-Cov-2 infection and should not be used as the sole basis for treatment or other patient management decisions. A negative result may occur with   improper specimen collection/handling, submission of specimen other than nasopharyngeal swab, presence of viral mutation(s) within the areas targeted by this assay, and inadequate number of viral copies(<138 copies/mL). A negative result must be combined with clinical observations, patient history, and epidemiological information. The expected result is Negative.  Fact Sheet for Patients:  EntrepreneurPulse.com.au  Fact Sheet for Healthcare Providers:  IncredibleEmployment.be  This test is no t yet approved or cleared by the [place] FDA and  has been authorized for detection and/or diagnosis of SARS-CoV-2 by FDA under an Emergency Use Authorization (EUA). This EUA will remain  in effect (meaning this test can be used) for the duration of the COVID-19 declaration under Section 564(b)(1) of the Act, 21 U.S.C.section 360bbb-3(b)(1), unless the authorization is terminated  or revoked sooner.       Influenza A by PCR NEGATIVE NEGATIVE Final   Influenza B by PCR NEGATIVE NEGATIVE Final    Comment: (NOTE) The Xpert Xpress SARS-CoV-2/FLU/RSV plus assay is intended as an aid in the diagnosis of influenza from Nasopharyngeal swab specimens and should not be used as a sole basis for treatment. Nasal washings and aspirates are unacceptable for Xpert Xpress SARS-CoV-2/FLU/RSV testing.  Fact Sheet for Patients: EntrepreneurPulse.com.au  Fact Sheet for Healthcare Providers: IncredibleEmployment.be  This test is not yet approved or cleared by the [place] FDA and has been authorized for detection and/or diagnosis of SARS-CoV-2 by FDA under an Emergency Use Authorization (EUA). This EUA will remain in effect (meaning this test can be used) for the duration of the COVID-19 declaration under Section 564(b)(1) of the Act, 21 U.S.C. section 360bbb-3(b)(1), unless the authorization is terminated  or revoked.  Performed at [facility], [place] [address] [place], [place] [address]   MRSA Next Gen by PCR, Nasal     Status: None   Collection Time: 09/16/20  9:32 PM   Specimen: Nasal Mucosa; Nasal Swab  Result Value Ref Range Status  MRSA by PCR Next Gen NOT DETECTED NOT DETECTED Final    Comment: (NOTE) The GeneXpert MRSA Assay (FDA approved for NASAL specimens only), is one component of a comprehensive MRSA colonization surveillance program. It is not intended to diagnose MRSA infection nor to guide or monitor treatment for MRSA infections. Test performance is not FDA approved in patients less than 61 years old. Performed at [facility], [place] [address], [place], [place] [address]          Radiology Studies: CARDIAC CATHETERIZATION  Result Date: 09/20/2020 Conclusions: 1. Severe three-vessel coronary artery disease, including chronic total occlusion of proximal LAD with left-to-left collaterals, 75% ostial/proximal ramus intermedius stenosis, and sequential 75% ostial and 90% distal RCA lesions.  LCx and OM3 have mild to moderate disease of up to 40%. 2. Moderately elevated left ventricular filling pressure (LVEDP ~30 mmHg). 3. Mild aortic stenosis (peak-to-peak gradient 10-15 mmHg. Recommendations: 1. Imaged reviewed with Dr. Burt Knack during the procedure.  We will plan for cardiac surgery consultation for CABG given severe LAD and RCA disease with good distal targets in both vessels. 2. Defer reinitiation of IV heparin, as the coronary artery disease appears chronic and hemoglobin has trended down significantly since admission. 3. Aggressive secondary prevention. 4. Diurese with furosemide 40 mg IV x 1.  Further diureses to be based on urine output, volume status, and renal function. Nelva Bush, MD [facility]        Scheduled Meds:  amLODipine  5 mg Oral Daily   aspirin EC  81 mg Oral Daily   Chlorhexidine Gluconate Cloth  6 each Topical Daily    lidocaine  1 patch Transdermal Q24H   mouth rinse  15 mL Mouth Rinse BID   pantoprazole  40 mg Oral Daily   rosuvastatin  20 mg Oral Daily   senna-docusate  1 tablet Oral Daily   sertraline  50 mg Oral Daily   sodium chloride flush  3 mL Intravenous Q12H   Continuous Infusions:  sodium chloride 1,000 mL (09/20/20 1110)   sodium chloride     heparin 1,750 Units/hr (09/21/20 0353)     LOS: 5 days    Time spent: 35 minutes.     Elmarie Shiley, MD [facility]   If 7PM-7AM, please contact night-coverage [email or web address]  09/21/2020, 7:44 AM

## 2020-09-21 NOTE — Progress Notes (Signed)
   09/21/20 1137  Incentive Spirometry  Incentive Spirometry Goal (mL) (RN or RT) 1000 mL  Incentive Spirometry - Achieved (mL) (RN, NT, or RT) 500 mL  Incentive Spirometry - # of Times (RN or NT) 4  Incentive Spirometry Effort (RN) Needs reinforcement

## 2020-09-21 NOTE — Progress Notes (Signed)
Pre cabg and lower extremity venous has been completed.   Preliminary results in CV Proc.   Abram Sander 09/21/2020 9:57 AM

## 2020-09-21 NOTE — Progress Notes (Signed)
[place] for heparin Indication: chest pain/ACS pending CABG  No Known Allergies  Patient Measurements: Height: 5\' 6"  (167.6 cm) Weight: 86.6 kg (191 lb 0.1 oz) IBW/kg (Calculated) : 63.8 Heparin Dosing Weight: 82kg  Vital Signs: Temp: 99.1 F (37.3 C) (06/27 2006) Temp Source: Oral (06/27 2006) BP: 112/78 (06/27 2006) Pulse Rate: 88 (06/27 2006)  Labs: Recent Labs    09/19/20 0344 09/19/20 1326 09/19/20 2155 09/20/20 0431 09/20/20 0839 09/21/20 0219  HGB 8.9*  --   --  8.4*  --  9.2*  HCT 25.8*  --   --  24.9*  --  26.7*  PLT 152  --   --  165  --  196  HEPARINUNFRC 0.17*   < > 0.22*  --  0.57 <0.10*  CREATININE 0.92  --   --  0.86  --  1.09   < > = values in this interval not displayed.     Estimated Creatinine Clearance: 59.4 mL/min (by C-G formula based on SCr of 1.09 mg/dL).   Medical History: Past Medical History:  Diagnosis Date   COVID-19    received Mab 01/2020   Diabetes mellitus without complication (HCC)    Heart murmur    Hypertension    Mild aortic stenosis      Assessment: 75 yo M s/p cath with multivessel CAD pending CABG 6/30. No anticoagulation prior to admission. Pharmacy consulted for heparin. No further concern for GIB, FOBT negative. Previously therapeutic on heparin 1600 units/hr.  Heparin level undetectable on gtt at 1500 units/hr. No issues with line or bleeding reported per RN.  Goal of Therapy:  Heparin level 0.3-0.7 units/ml Monitor platelets by anticoagulation protocol: Yes   Plan:  Increase heparin to 1750 units/hr F/u 8 hr heparin level  Sherlon Handing, PharmD, BCPS Please see amion for complete clinical pharmacist phone list 09/21/2020 3:35 AM

## 2020-09-21 NOTE — TOC Progression Note (Signed)
Transition of Care [facility]) - Progression Note    Patient Details  Name: Brian Robinson MRN: [id number] Date of Birth: May 27, 1945  Transition of Care [facility]) CM/SW Contact  Zenon Mayo, RN Phone Number: 09/21/2020, 10:18 AM  Clinical Narrative:    from home, Cardiac Arrest, NTSTEMI, for cath,  hep drip.  Pt eval rec HHPT.  For pre CABG work up.  TOC will continue to follow for dc needs.        Expected Discharge Plan and Services                                                 Social Determinants of Health (SDOH) Interventions    Readmission Risk Interventions No flowsheet data found.

## 2020-09-21 NOTE — Progress Notes (Signed)
Discussed IS (1000 ml now), sternal precautions, mobility post op, and d/c planning with pt and son. Pt very receptive. Cognitively he was able to recall his medical situation and his son's birthday. He was able to stand from recliner with light support on chair arm. I held ambulation right now as PT is coming soon to assess. He will have care at home at d/c. Left materials for review. [place], ACSM 1:27 PM 09/21/2020

## 2020-09-21 NOTE — Progress Notes (Signed)
[place] for heparin Indication: chest pain/ACS pending CABG  No Known Allergies  Patient Measurements: Height: 5\' 6"  (167.6 cm) Weight: 86.6 kg (191 lb) IBW/kg (Calculated) : 63.8 Heparin Dosing Weight: 82kg  Vital Signs: Temp: 98.6 F (37 C) (06/28 1149) Temp Source: Oral (06/28 1149) BP: 125/67 (06/28 1149) Pulse Rate: 94 (06/28 1149)  Labs: Recent Labs    09/19/20 0344 09/19/20 1326 09/20/20 0431 09/20/20 0839 09/21/20 0219 09/21/20 1151  HGB 8.9*  --  8.4*  --  9.2*  --   HCT 25.8*  --  24.9*  --  26.7*  --   PLT 152  --  165  --  196  --   HEPARINUNFRC 0.17*   < >  --  0.57 <0.10* 0.39  CREATININE 0.92  --  0.86  --  1.09  --    < > = values in this interval not displayed.     Estimated Creatinine Clearance: 59.4 mL/min (by C-G formula based on SCr of 1.09 mg/dL).   Medical History: Past Medical History:  Diagnosis Date   COVID-19    received Mab 01/2020   Diabetes mellitus without complication (HCC)    Heart murmur    Hypertension    Mild aortic stenosis      Assessment: 75 yo M s/p cath with multivessel CAD pending CABG 6/30. No anticoagulation prior to admission. Pharmacy consulted for heparin. No further concern for GIB, FOBT negative. Previously therapeutic on heparin 1600 units/hr.  Heparin level therapeutic, no bleeding issues documented.  Goal of Therapy:  Heparin level 0.3-0.7 units/ml Monitor platelets by anticoagulation protocol: Yes   Plan:  Continue heparin 1750 units/h Daily heparin level and CBC   Arrie Senate, PharmD, [place], [facility] Clinical Pharmacist [phone] Please check AMION for all [facility] Pharmacy numbers 09/21/2020

## 2020-09-21 NOTE — Progress Notes (Signed)
Physical Therapy Treatment Patient Details Name: Brian Robinson MRN: [id number] DOB: 1945/08/04 Today's Date: 09/21/2020    History of Present Illness 75 y/o white male that presented to the ER 09/16/20 from home s/p cardiac arrest.  The pt was standing next to his wife when he slumped to the floor in cardiac arrest. CPR was performed by the wife. Intubated on arrival to ED. EEG shows nonspecific encephalopathy. bil anterior rib fractures s/p CPR; extubated 6/24; cardiology consult +NSTEMI. Now plan for CABG on 09/23/20.  PMH HTN, DM.    PT Comments    Pt received in chair, son present in room and encouraging, pt with good participation and tolerance for gait and transfer training. Pt performed seated exercises with good tolerance and gait/transfer training, needing up to minA physical assist. Reviewed importance of incentive spirometer, post-CABG mobility precautions, fall risk prevention and close monitoring of symptoms/vitals due to previous near-syncopal episode, pt with soft BP but no acute s/sx distress (pt now with knee-high compression stockings). Pt continues to benefit from PT services to progress toward functional mobility goals.   Follow Up Recommendations  Home health PT;Supervision/Assistance - 24 hour     Equipment Recommendations  Other (comment) (continue to assess post-CABG)    Recommendations for Other Services       Precautions / Restrictions Precautions Precautions: Fall;Other (comment) Precaution Comments: soft BP Restrictions Weight Bearing Restrictions: No    Mobility  Bed Mobility      General bed mobility comments: Up in recliner on entry    Transfers Overall transfer level: Needs assistance Equipment used: None Transfers: Sit to/from Stand Sit to Stand: Min guard         General transfer comment: to perform within Move in the Tube precautions, had pt attempt via this method due to upcoming CABG sx; from chair x2  reps  Ambulation/Gait Ambulation/Gait assistance: Min assist;Min guard Gait Distance (Feet): 150 Feet Assistive device: None Gait Pattern/deviations: Step-through pattern;Drifts right/left;Staggering right     General Gait Details: after ~61ft pt with drift to right and increased lateral sway when turning; pt mostly min guard but up to minA with turns/mild unsteadiness at time; checked BP x3 while standing (pre-gait, mid-gait and post-gait) and BP soft but MAP >65 and no dizziness reported and good pallor throughout   Stairs             Wheelchair Mobility    Modified Rankin (Stroke Patients Only)       Balance Overall balance assessment: Mild deficits observed, not formally tested   Sitting balance-Leahy Scale: Good       Standing balance-Leahy Scale: Fair                              Cognition Arousal/Alertness: Awake/alert Behavior During Therapy: WFL for tasks assessed/performed Overall Cognitive Status: Within Functional Limits for tasks assessed Area of Impairment: Attention;Memory;Safety/judgement;Problem solving                   Current Attention Level: Selective Memory: Decreased short-term memory   Safety/Judgement: Decreased awareness of safety;Decreased awareness of deficits Awareness: Emergent Problem Solving: Slow processing;Requires verbal cues General Comments: pt with decreased insight into deficits but participatory as able; needs cues/reminders for Move in the Tube precautions (due to upcoming plan for CABG) although cardiac rehab had reviewed them ~5 mins prior to PT session.      Exercises General Exercises - Lower Extremity Ankle Circles/Pumps: AROM;Both;10 reps;Seated  Long Arc Quad: AROM;Both;10 reps;Seated Hip Flexion/Marching: AROM;Both;Seated;Standing;20 reps (x10 reps ea posture) Other Exercises Other Exercises: BUE AROM: arm bicycle, shoulder abduction with elbows flexed x10 reps    General Comments General  comments (skin integrity, edema, etc.): B knee high TED hose noted to be donned prior to session; SpO2 100% on RA, HR WNL; BP in flowsheets, 120/70 (85) seated in chair and 104/58 (72) initially upon stnading, not dizzy; BP 95/67 (77) after standing ~10 mins/gait but no dizziness and pt with good pallor.      Pertinent Vitals/Pain Pain Assessment: No/denies pain    Home Living                      Prior Function            PT Goals (current goals can now be found in the care plan section) Acute Rehab PT Goals Patient Stated Goal: get stronger after CABG and go home PT Goal Formulation: With patient Time For Goal Achievement: 10/02/20 Progress towards PT goals: Progressing toward goals    Frequency    Min 3X/week      PT Plan Current plan remains appropriate    Co-evaluation              AM-PAC PT "6 Clicks" Mobility   Outcome Measure  Help needed turning from your back to your side while in a flat bed without using bedrails?: None Help needed moving from lying on your back to sitting on the side of a flat bed without using bedrails?: A Little Help needed moving to and from a bed to a chair (including a wheelchair)?: A Little Help needed standing up from a chair using your arms (e.g., wheelchair or bedside chair)?: A Little Help needed to walk in hospital room?: A Little Help needed climbing 3-5 steps with a railing? : Total 6 Click Score: 17    End of Session Equipment Utilized During Treatment: Gait belt Activity Tolerance: Patient tolerated treatment well Patient left: in chair;with call bell/phone within reach;with family/visitor present (heels floated) Nurse Communication: Mobility status;Other (comment) (BP soft but asymptomatic) PT Visit Diagnosis: Unsteadiness on feet (R26.81);Difficulty in walking, not elsewhere classified (R26.2)     Time: 3785-8850 PT Time Calculation (min) (ACUTE ONLY): 29 min  Charges:  $Gait Training: 8-22  mins $Therapeutic Activity: 8-22 mins                     Brian Robinson P., PTA Acute Rehabilitation Services Pager: [phone] Office: [place] 09/21/2020, 5:30 PM

## 2020-09-22 ENCOUNTER — Inpatient Hospital Stay (HOSPITAL_COMMUNITY): Payer: Medicare Other

## 2020-09-22 DIAGNOSIS — R509 Fever, unspecified: Secondary | ICD-10-CM

## 2020-09-22 DIAGNOSIS — I2510 Atherosclerotic heart disease of native coronary artery without angina pectoris: Secondary | ICD-10-CM

## 2020-09-22 LAB — PULMONARY FUNCTION TEST
FEF 25-75 Pre: 1.31 L/sec
FEF2575-%Pred-Pre: 72 %
FEV1-%Pred-Pre: 44 %
FEV1-Pre: 1.14 L
FEV1FVC-%Pred-Pre: 114 %
FEV6-%Pred-Pre: 40 %
FEV6-Pre: 1.33 L
FEV6FVC-%Pred-Pre: 107 %
FVC-%Pred-Pre: 38 %
FVC-Pre: 1.37 L
Pre FEV1/FVC ratio: 83 %
Pre FEV6/FVC Ratio: 100 %

## 2020-09-22 LAB — URINALYSIS, ROUTINE W REFLEX MICROSCOPIC
Bilirubin Urine: NEGATIVE
Glucose, UA: NEGATIVE mg/dL
Ketones, ur: NEGATIVE mg/dL
Nitrite: NEGATIVE
Protein, ur: NEGATIVE mg/dL
Specific Gravity, Urine: 1.02 (ref 1.005–1.030)
pH: 5.5 (ref 5.0–8.0)

## 2020-09-22 LAB — URINALYSIS, MICROSCOPIC (REFLEX)

## 2020-09-22 LAB — CBC
HCT: 22.2 % — ABNORMAL LOW (ref 39.0–52.0)
HCT: 25.1 % — ABNORMAL LOW (ref 39.0–52.0)
Hemoglobin: 7.5 g/dL — ABNORMAL LOW (ref 13.0–17.0)
Hemoglobin: 8.7 g/dL — ABNORMAL LOW (ref 13.0–17.0)
MCH: 29.3 pg (ref 26.0–34.0)
MCH: 29.8 pg (ref 26.0–34.0)
MCHC: 33.8 g/dL (ref 30.0–36.0)
MCHC: 34.7 g/dL (ref 30.0–36.0)
MCV: 86.7 fL (ref 80.0–100.0)
MCV: 86 fL (ref 80.0–100.0)
Platelets: 209 10*3/uL (ref 150–400)
Platelets: 250 10*3/uL (ref 150–400)
RBC: 2.56 MIL/uL — ABNORMAL LOW (ref 4.22–5.81)
RBC: 2.92 MIL/uL — ABNORMAL LOW (ref 4.22–5.81)
RDW: 13.9 % (ref 11.5–15.5)
RDW: 13.7 % (ref 11.5–15.5)
WBC: 18.6 10*3/uL — ABNORMAL HIGH (ref 4.0–10.5)
WBC: 22.1 10*3/uL — ABNORMAL HIGH (ref 4.0–10.5)
nRBC: 0 % (ref 0.0–0.2)
nRBC: 0 % (ref 0.0–0.2)

## 2020-09-22 LAB — VITAMIN B12: Vitamin B-12: 258 pg/mL (ref 180–914)

## 2020-09-22 LAB — BASIC METABOLIC PANEL
Anion gap: 10 (ref 5–15)
BUN: 39 mg/dL — ABNORMAL HIGH (ref 8–23)
CO2: 25 mmol/L (ref 22–32)
Calcium: 8.6 mg/dL — ABNORMAL LOW (ref 8.9–10.3)
Chloride: 94 mmol/L — ABNORMAL LOW (ref 98–111)
Creatinine, Ser: 1.19 mg/dL (ref 0.61–1.24)
GFR, Estimated: >60 mL/min (ref >60)
Glucose, Bld: 124 mg/dL — ABNORMAL HIGH (ref 70–99)
Potassium: 3.5 mmol/L (ref 3.5–5.1)
Sodium: 129 mmol/L — ABNORMAL LOW (ref 135–145)

## 2020-09-22 LAB — MAGNESIUM: Magnesium: 2 mg/dL (ref 1.7–2.4)

## 2020-09-22 LAB — ABO/RH: ABO/RH(D): AB NEG

## 2020-09-22 LAB — PROCALCITONIN: Procalcitonin: 0.21 ng/mL

## 2020-09-22 LAB — SURGICAL PCR SCREEN
MRSA, PCR: NEGATIVE
Staphylococcus aureus: NEGATIVE

## 2020-09-22 LAB — PHOSPHORUS: Phosphorus: 3.5 mg/dL (ref 2.5–4.6)

## 2020-09-22 LAB — HEPARIN LEVEL (UNFRACTIONATED): Heparin Unfractionated: 0.69 IU/mL (ref 0.30–0.70)

## 2020-09-22 MED ORDER — NITROGLYCERIN IN D5W 200-5 MCG/ML-% IV SOLN
2.0000 ug/min | INTRAVENOUS | Status: DC
  Filled 2020-09-22: qty 250

## 2020-09-22 MED ORDER — MILRINONE LACTATE IN DEXTROSE 20-5 MG/100ML-% IV SOLN
0.3000 ug/kg/min | INTRAVENOUS | Status: DC
  Filled 2020-09-22: qty 100

## 2020-09-22 MED ORDER — TRANEXAMIC ACID 1000 MG/10ML IV SOLN
1.5000 mg/kg/h | INTRAVENOUS | Status: DC
  Filled 2020-09-22: qty 25

## 2020-09-22 MED ORDER — CHLORHEXIDINE GLUCONATE CLOTH 2 % EX PADS
6.0000 | MEDICATED_PAD | Freq: Once | CUTANEOUS | Status: AC
  Administered 2020-09-22: 6 via TOPICAL

## 2020-09-22 MED ORDER — TRANEXAMIC ACID (OHS) PUMP PRIME SOLUTION
2.0000 mg/kg | INTRAVENOUS | Status: DC
  Filled 2020-09-22: qty 1.8

## 2020-09-22 MED ORDER — PHENYLEPHRINE HCL-NACL 20-0.9 MG/250ML-% IV SOLN
30.0000 ug/min | INTRAVENOUS | Status: DC
  Filled 2020-09-22: qty 250

## 2020-09-22 MED ORDER — CHLORHEXIDINE GLUCONATE 0.12 % MT SOLN
15.0000 mL | Freq: Once | OROMUCOSAL | Status: AC
  Administered 2020-09-23: 15 mL via OROMUCOSAL
  Filled 2020-09-22: qty 15

## 2020-09-22 MED ORDER — DEXMEDETOMIDINE HCL IN NACL 400 MCG/100ML IV SOLN
0.1000 ug/kg/h | INTRAVENOUS | Status: DC
  Filled 2020-09-22: qty 100

## 2020-09-22 MED ORDER — METOPROLOL TARTRATE 12.5 MG HALF TABLET
12.5000 mg | ORAL_TABLET | Freq: Once | ORAL | Status: AC
  Administered 2020-09-23: 12.5 mg via ORAL
  Filled 2020-09-22: qty 1

## 2020-09-22 MED ORDER — VITAMIN B-12 1000 MCG PO TABS
1000.0000 ug | ORAL_TABLET | Freq: Every day | ORAL | Status: DC
  Administered 2020-09-22: 1000 ug via ORAL
  Filled 2020-09-22: qty 1

## 2020-09-22 MED ORDER — EPINEPHRINE HCL 5 MG/250ML IV SOLN IN NS
0.0000 ug/min | INTRAVENOUS | Status: AC
  Administered 2020-09-23: 1 ug/min via INTRAVENOUS
  Filled 2020-09-22: qty 250

## 2020-09-22 MED ORDER — PLASMA-LYTE A IV SOLN
INTRAVENOUS | Status: DC
  Filled 2020-09-22 (×2): qty 5

## 2020-09-22 MED ORDER — CEFAZOLIN SODIUM-DEXTROSE 2-4 GM/100ML-% IV SOLN
2.0000 g | INTRAVENOUS | Status: AC
  Administered 2020-09-23: 2 g via INTRAVENOUS
  Filled 2020-09-22: qty 100

## 2020-09-22 MED ORDER — BISACODYL 5 MG PO TBEC
5.0000 mg | DELAYED_RELEASE_TABLET | Freq: Once | ORAL | Status: AC
  Administered 2020-09-22: 5 mg via ORAL
  Filled 2020-09-22: qty 1

## 2020-09-22 MED ORDER — TRANEXAMIC ACID (OHS) BOLUS VIA INFUSION
15.0000 mg/kg | INTRAVENOUS | Status: AC
  Administered 2020-09-23: 1353 mg via INTRAVENOUS
  Filled 2020-09-22: qty 1353

## 2020-09-22 MED ORDER — CHLORHEXIDINE GLUCONATE CLOTH 2 % EX PADS
6.0000 | MEDICATED_PAD | Freq: Once | CUTANEOUS | Status: AC
  Administered 2020-09-23: 6 via TOPICAL

## 2020-09-22 MED ORDER — POTASSIUM CHLORIDE CRYS ER 20 MEQ PO TBCR
20.0000 meq | EXTENDED_RELEASE_TABLET | Freq: Once | ORAL | Status: AC
  Administered 2020-09-22: 20 meq via ORAL
  Filled 2020-09-22: qty 1

## 2020-09-22 MED ORDER — POTASSIUM CHLORIDE 2 MEQ/ML IV SOLN
80.0000 meq | INTRAVENOUS | Status: DC
  Filled 2020-09-22: qty 40

## 2020-09-22 MED ORDER — SODIUM CHLORIDE 0.9 % IV SOLN
INTRAVENOUS | Status: DC
  Filled 2020-09-22: qty 30

## 2020-09-22 MED ORDER — DIAZEPAM 2 MG PO TABS
2.0000 mg | ORAL_TABLET | Freq: Once | ORAL | Status: AC
  Administered 2020-09-23: 2 mg via ORAL
  Filled 2020-09-22: qty 1

## 2020-09-22 MED ORDER — ACETAMINOPHEN 325 MG PO TABS
650.0000 mg | ORAL_TABLET | ORAL | Status: DC | PRN
  Administered 2020-09-22: 650 mg via ORAL
  Filled 2020-09-22: qty 2

## 2020-09-22 MED ORDER — NOREPINEPHRINE 4 MG/250ML-% IV SOLN
0.0000 ug/min | INTRAVENOUS | Status: DC
  Filled 2020-09-22: qty 250

## 2020-09-22 MED ORDER — MAGNESIUM SULFATE 50 % IJ SOLN
40.0000 meq | INTRAMUSCULAR | Status: DC
  Filled 2020-09-22: qty 9.85

## 2020-09-22 MED ORDER — VANCOMYCIN HCL 1500 MG/300ML IV SOLN
1500.0000 mg | INTRAVENOUS | Status: AC
  Administered 2020-09-23: 1500 mg via INTRAVENOUS
  Filled 2020-09-22: qty 300

## 2020-09-22 MED ORDER — INSULIN REGULAR(HUMAN) IN NACL 100-0.9 UT/100ML-% IV SOLN
INTRAVENOUS | Status: DC
  Filled 2020-09-22: qty 100

## 2020-09-22 NOTE — Plan of Care (Signed)
  Problem: Clinical Measurements: Goal: Cardiovascular complication will be avoided Outcome: Progressing   Problem: Clinical Measurements: Goal: Diagnostic test results will improve Outcome: Progressing   Problem: Clinical Measurements: Goal: Will remain free from infection Outcome: Progressing

## 2020-09-22 NOTE — Progress Notes (Signed)
Physical Therapy Treatment Patient Details Name: Cleophus Mendonsa MRN: [id number] DOB: 18-Aug-1945 Today's Date: 09/22/2020    History of Present Illness 75 y/o white male that presented to the ER 09/16/20 from home s/p cardiac arrest.  The pt was standing next to his wife when he slumped to the floor in cardiac arrest. CPR was performed by the wife. Intubated on arrival to ED. EEG shows nonspecific encephalopathy. bil anterior rib fractures s/p CPR; extubated 6/24; cardiology consult +NSTEMI. Now plan for CABG on 09/23/20.  PMH HTN, DM.    PT Comments    Pt was seen for monitoring of his mobility on hallway to walk with care for his tolerance for gait.  Post gait sats were 97% and pulse 111.  Pt is not SOB or in pain, encouraged him to safely walk with staff.  Pt reviewed with PT the sternal precautions including use of a pillow to support standing and transitions such as out of bed.  He voiced understanding and 50% of the time gave return on the instructions when asked.  Follow acutely for post CABG care with reminders about his instructions and may provide another hand out with better illustrations than the cardiac care booklet given to him.  See for acute PT goals, update based on his post surgery condition.   Follow Up Recommendations  Home health PT;Supervision/Assistance - 24 hour     Equipment Recommendations  Other (comment) (will need post op check)    Recommendations for Other Services       Precautions / Restrictions Precautions Precautions: Fall Precaution Comments: watch BP Restrictions Weight Bearing Restrictions: No    Mobility  Bed Mobility               General bed mobility comments: in recliner    Transfers Overall transfer level: Needs assistance Equipment used: None Transfers: Sit to/from Stand Sit to Stand: Min guard;Min assist         General transfer comment: min assist to support back and to control sitting  descent  Ambulation/Gait Ambulation/Gait assistance: Min guard Gait Distance (Feet): 75 Feet Assistive device: None Gait Pattern/deviations: Step-through pattern;Wide base of support;Decreased stride length Gait velocity: controlled   General Gait Details: slow pace with pt being encouraged not to rush, no SOB or pain with good color   Stairs             Wheelchair Mobility    Modified Rankin (Stroke Patients Only)       Balance Overall balance assessment: Mild deficits observed, not formally tested   Sitting balance-Leahy Scale: Good       Standing balance-Leahy Scale: Fair                              Cognition Arousal/Alertness: Awake/alert Behavior During Therapy: WFL for tasks assessed/performed Overall Cognitive Status: Within Functional Limits for tasks assessed Area of Impairment: Memory;Safety/judgement                   Current Attention Level: Selective Memory: Decreased recall of precautions;Decreased short-term memory   Safety/Judgement: Decreased awareness of deficits;Decreased awareness of safety     General Comments: instructions were reviewed for CABG, pt is partially aware      Exercises      General Comments General comments (skin integrity, edema, etc.): slow pace with brief standing, no conversation and asked about symptoms      Pertinent Vitals/Pain Pain Assessment: Faces Faces Pain  Scale: Hurts little more Pain Location: ribs/chest Pain Descriptors / Indicators: Grimacing;Guarding;Aching Pain Intervention(s): Limited activity within patient's tolerance;Monitored during session;Premedicated before session;Repositioned    Home Living                      Prior Function            PT Goals (current goals can now be found in the care plan section) Acute Rehab PT Goals Patient Stated Goal: get better and home Progress towards PT goals: Progressing toward goals    Frequency    Min  3X/week      PT Plan Current plan remains appropriate    Co-evaluation              AM-PAC PT "6 Clicks" Mobility   Outcome Measure  Help needed turning from your back to your side while in a flat bed without using bedrails?: None Help needed moving from lying on your back to sitting on the side of a flat bed without using bedrails?: A Little Help needed moving to and from a bed to a chair (including a wheelchair)?: A Little Help needed standing up from a chair using your arms (e.g., wheelchair or bedside chair)?: A Little Help needed to walk in hospital room?: A Little Help needed climbing 3-5 steps with a railing? : Total 6 Click Score: 17    End of Session Equipment Utilized During Treatment: Gait belt Activity Tolerance: Patient tolerated treatment well Patient left: in chair;with call bell/phone within reach;with family/visitor present Nurse Communication: Mobility status;Other (comment) PT Visit Diagnosis: Difficulty in walking, not elsewhere classified (R26.2);Other (comment) (cardiopulm)     Time: 2694-8546 PT Time Calculation (min) (ACUTE ONLY): 27 min  Charges:  $Gait Training: 8-22 mins                   Ramond Dial 09/22/2020, 5:41 PM  Mee Hives, PT MS Acute Rehab Dept. Number: [place] [facility] [place]

## 2020-09-22 NOTE — Progress Notes (Signed)
Occupational Therapy Treatment Patient Details Name: Brian Robinson MRN: [id number] DOB: 21-Nov-1945 Today's Date: 09/22/2020    History of present illness 75 y/o white male that presented to the ER 09/16/20 from home s/p cardiac arrest.  The pt was standing next to his wife when he slumped to the floor in cardiac arrest. CPR was performed by the wife. Intubated on arrival to ED. EEG shows nonspecific encephalopathy. bil anterior rib fractures s/p CPR; extubated 6/24; cardiology consult +NSTEMI. Now plan for CABG on 09/23/20.  PMH HTN, DM.   OT comments  Treatment session with focus on functional mobility, energy conservation, and sternal precautions in preparation for upcoming CABG.  Therapist provided pt and wife with handout for sternal precautions with "move in the tube" and the 4 P's for energy conservation.  Pt and wife report that pt is having increased anxiety awaiting procedure and as pt is not used to sitting around.  Therapist encouraged energy conservation especially with pacing self as pt usually quite active.  Educated on sternal precautions pertaining to UB dressing and self-care tasks.  Pt ambulated ~50' with CGA without AD with no s/sx distress.  Pt will continue to benefit for OT services to increase independence with self-care tasks, activity tolerance/endurance post CABG.    Follow Up Recommendations  No OT follow up    Equipment Recommendations  None recommended by OT    Recommendations for Other Services      Precautions / Restrictions Precautions Precautions: Fall;Other (comment) Precaution Comments: soft BP Restrictions Weight Bearing Restrictions: No       Mobility Bed Mobility               General bed mobility comments: Up in recliner on entry    Transfers Overall transfer level: Needs assistance Equipment used: None Transfers: Sit to/from Stand Sit to Stand: Supervision              Balance     Sitting balance-Leahy Scale: Good                                      ADL either performed or assessed with clinical judgement   ADL Overall ADL's : Modified independent                                             Vision Baseline Vision/History: Wears glasses Wears Glasses: Reading only Patient Visual Report: No change from baseline            Cognition Arousal/Alertness: Awake/alert Behavior During Therapy: WFL for tasks assessed/performed;Anxious Overall Cognitive Status: Within Functional Limits for tasks assessed                                 General Comments: Noted some decreased insight into deficits; needs cues/reminders for Move in the Tube precautions (due to upcoming plan for CABG) - provided handout              General Comments MD requested to not "over exert" pt therefore limited mobility in hallway ~50' with no reports of dizziness, HR 106 and O2 92% on RA and good pallor at end of session    Pertinent Vitals/ Pain       Pain Assessment: No/denies  pain         Frequency  Min 2X/week        Progress Toward Goals  OT Goals(current goals can now be found in the care plan section)  Progress towards OT goals: Progressing toward goals  Acute Rehab OT Goals Patient Stated Goal: get stronger after CABG and go home OT Goal Formulation: With patient/family Time For Goal Achievement: 10/03/20 Potential to Achieve Goals: Good  Plan Discharge plan remains appropriate       AM-PAC OT "6 Clicks" Daily Activity     Outcome Measure   Help from another person eating meals?: None Help from another person taking care of personal grooming?: None Help from another person toileting, which includes using toliet, bedpan, or urinal?: None Help from another person bathing (including washing, rinsing, drying)?: None Help from another person to put on and taking off regular upper body clothing?: None Help from another person to put on and taking off regular  lower body clothing?: None 6 Click Score: 24    End of Session Equipment Utilized During Treatment: Gait belt  OT Visit Diagnosis: Unsteadiness on feet (R26.81);Other abnormalities of gait and mobility (R26.89);Muscle weakness (generalized) (M62.81)   Activity Tolerance Patient tolerated treatment well   Patient Left in chair;with call bell/phone within reach;with family/visitor present;with chair alarm set   Nurse Communication Mobility status        Time: 3374-4514 OT Time Calculation (min): 17 min  Charges: OT General Charges $OT Visit: 1 Visit OT Treatments $Self Care/Home Management : 8-22 mins    Simonne Come [phone]  09/22/2020, 11:25 AM

## 2020-09-22 NOTE — Progress Notes (Addendum)
PROGRESS NOTE    Brian Robinson  IRS:[id number] DOB: [birth year] DOA: 09/16/2020 PCP: Seward Carol, MD   Brief Narrative: 75 year old with past medical history significant for hypertension, type 2 diabetes, presented to the hospital after having a witness cardiac arrest at home.  EMS was contacted, patient had 1 round of CPR, he had a shockable rhythm and received 1 shock and return spontaneous circulation.  On arrival to the ED patient remained obtunded, and therefore was intubated for airway protection.  He was admitted to the ICU and extubated on 09/17/2020.  His care was transferred to the hospitalist team on 09/19/2020.  Patient underwent cardiac cath on 6/27, he ruled in for MI.  Heart cath with severe three-vessel coronary artery disease, including chronic total occlusion of proximal LAD with left to left collaterals, 75% ostial proximal ramus intermedius stenosis and sequential 75% ostial and 90% distal RCA lesions.  LCx and OM 3 have mild to moderate disease of up to 40%. CVTS has been consulted, for evaluation for CABG. Plan for CABG at the end of the week.   Assessment & Plan:   Active Problems:   Cardiac arrest [facility])   Encephalopathy acute  1-Out of Hospital Cardiac Arrest with concern for V. tach V. Fib: -Patient ruled in for MI, cath showed severe 3 vessels coronary artery disease. -ECHO; Normal EF.  -On IV heparin.  -Cardiology and CVTS following.  -Continue with  oral magnesium supplementation , Mg 2.0  2-Acute on chronic anemia;  FOBT negative.  Iron deficiency.  He denies melena.  Plan to start oral iron when infection is ruled out.  Started B 12 supplementation.  Hb this am 7.6-- repeated 8.7.  CT abdomen pelvis: negative for retroperitoneal bleed.  ? Hematuria.  Plan to hold Heparin gtt.   3-Leukocytosis, Low grade Fever:  -Chest x ray 6-28: negative for PNA>  -LE without redness. Doppler negative for DVT.  -Incentive spirometry.  -UA with 6-10 WBC.  -urine  culture growing Enterococcus Faecalis.  -on ceftriaxone  Per ID no need to start Vancomycin today. Follow blood cultures.   4-Left LE edema;  -Doppler negative for DVT.  -TED hose.  -ensure to improve nutrition. -no redness.  -received IV lasix time one.   5-NSTEMI;  Cardiology following.  Cath showed severe three vessel diseases. Needs CABG.   HTN; Continue with Norvasc.   Dyslipidemia; Continue with statins.   Depression: Continue with sertraline.   Mild elevation AST; monitor.  Hyponatremia; monitor on lasix.   Anoxic Encephalopathy; post cardiac arrest.  Stable.  EEG negative.  Start B 12 supplement.      Estimated body mass index is 32.09 kg/m as calculated from the following:   Height as of this encounter: 5\' 6"  (1.676 m).   Weight as of this encounter: 90.2 kg.   DVT prophylaxis: Heparin gtt Code Status: Full code Family Communication: care discussed with wife, daughter who was at bedside.  Disposition Plan:  Status is: Inpatient  Remains inpatient appropriate because:IV treatments appropriate due to intensity of illness or inability to take PO  Dispo: The patient is from: Home              Anticipated d/c is to:  to be determine.               Patient currently is not medically stable to d/c.   Difficult to place patient No        Consultants:  Cardiology CVTS  Procedures:  Cath; Severe three-vessel  coronary artery disease, including chronic total occlusion of proximal LAD with left-to-left collaterals, 75% ostial/proximal ramus intermedius stenosis, and sequential 75% ostial and 90% distal RCA lesions.  LCx and OM3 have mild to moderate disease of up to 40%. Moderately elevated left ventricular filling pressure (LVEDP ~30 mmHg). Mild aortic stenosis (peak-to-peak gradient 10-15 mmHg.   Antimicrobials:    Subjective: Left LE edema has improved, less tightness.  He report chest pain form rib fracture.  He is breathing ok.     Objective: Vitals:   09/21/20 0839 09/21/20 1149 09/21/20 2021 09/22/20 0441  BP: 113/60 125/67 118/70 110/82  Pulse: 89 94 (!) 101 97  Resp: 18 18 16 16   Temp: 98.3 F (36.8 C) 98.6 F (37 C) 100.3 F (37.9 C) 99.5 F (37.5 C)  TempSrc: Oral Oral Oral Oral  SpO2: 95% 97% 92% 95%  Weight:    90.2 kg  Height:        Intake/Output Summary (Last 24 hours) at 09/22/2020 0812 Last data filed at 09/22/2020 0656 Gross per 24 hour  Intake 1002.89 ml  Output 620 ml  Net 382.89 ml    Filed Weights   09/20/20 0500 09/21/20 0500 09/22/20 0441  Weight: 86.6 kg 86.6 kg 90.2 kg    Examination:  General exam: NAD Respiratory system: CTA Cardiovascular system: S 1, S 2  RRR Gastrointestinal system:  BS present, soft,  nt Central nervous system:  non focal.  Extremities: Left LE with less edema Skin: No redness    Data Reviewed: I have personally reviewed following labs and imaging studies  CBC: Recent Labs  Lab 09/16/20 1726 09/16/20 1731 09/18/20 0023 09/19/20 0344 09/20/20 0431 09/21/20 0219 09/22/20 0349  WBC 12.9*   < > 12.3* 14.5* 13.8* 16.3* 18.6*  NEUTROABS 7.2  --   --   --   --   --   --   HGB 13.3   < > 10.9* 8.9* 8.4* 9.2* 7.5*  HCT 38.9*   < > 32.6* 25.8* 24.9* 26.7* 22.2*  MCV 86.1   < > 85.3 86.6 86.2 86.7 86.7  PLT 211   < > 149* 152 165 196 209   < > = values in this interval not displayed.    Basic Metabolic Panel: Recent Labs  Lab 09/18/20 0023 09/19/20 0344 09/20/20 0431 09/21/20 0219 09/22/20 0349  NA 138 135 130* 132* 129*  K 3.3* 3.8 3.5 3.8 3.5  CL 99 96* 91* 94* 94*  CO2 32 29 28 26 25   GLUCOSE 118* 139* 117* 138* 124*  BUN 22 26* 28* 29* 39*  CREATININE 0.88 0.92 0.86 1.09 1.19  CALCIUM 8.9 8.8* 8.8* 8.9 8.6*  MG 2.3 1.9 2.0 1.8 2.0  PHOS 3.6 2.8 3.5 3.1 3.5    GFR: Estimated Creatinine Clearance: 55.6 mL/min (by C-G formula based on SCr of 1.19 mg/dL). Liver Function Tests: Recent Labs  Lab 09/17/20 0153  09/18/20 0023 09/19/20 0344 09/20/20 0431 09/21/20 0219  AST 101* 68* 80* 133* 131*  ALT 71* 55* 42 42 43  ALKPHOS 64 56 54 47 50  BILITOT 1.2 1.0 1.1 1.3* 1.4*  PROT 6.4* 6.7 6.7 6.5 7.0  ALBUMIN 3.4* 3.4* 3.1* 3.2* 3.3*    Recent Labs  Lab 09/16/20 1726  LIPASE 54*    No results for input(s): AMMONIA in the last 168 hours. Coagulation Profile: Recent Labs  Lab 09/16/20 1726  INR 1.0    Cardiac Enzymes: No results for input(s): CKTOTAL, CKMB, CKMBINDEX,  TROPONINI in the last 168 hours. BNP (last 3 results) No results for input(s): PROBNP in the last 8760 hours. HbA1C: No results for input(s): HGBA1C in the last 72 hours. CBG: Recent Labs  Lab 09/17/20 1543 09/17/20 1932 09/17/20 2339 09/18/20 0321 09/18/20 0754  GLUCAP 138* 121* 129* 127* 136*    Lipid Profile: No results for input(s): CHOL, HDL, LDLCALC, TRIG, CHOLHDL, LDLDIRECT in the last 72 hours. Thyroid Function Tests: No results for input(s): TSH, T4TOTAL, FREET4, T3FREE, THYROIDAB in the last 72 hours. Anemia Panel: Recent Labs    09/21/20 0219 09/22/20 0349  VITAMINB12  --  258  FERRITIN 318  --   TIBC 308  --   IRON 29*  --     Sepsis Labs: No results for input(s): PROCALCITON, LATICACIDVEN in the last 168 hours.  Recent Results (from the past 240 hour(s))  Resp Panel by RT-PCR (Flu A&B, Covid) Nasopharyngeal Swab     Status: None   Collection Time: 09/16/20  6:08 PM   Specimen: Nasopharyngeal Swab; Nasopharyngeal(NP) swabs in vial transport medium  Result Value Ref Range Status   SARS Coronavirus 2 by RT PCR NEGATIVE NEGATIVE Final    Comment: (NOTE) SARS-CoV-2 target nucleic acids are NOT DETECTED.  The SARS-CoV-2 RNA is generally detectable in upper respiratory specimens during the acute phase of infection. The lowest concentration of SARS-CoV-2 viral copies this assay can detect is 138 copies/mL. A negative result does not preclude SARS-Cov-2 infection and should not be used as  the sole basis for treatment or other patient management decisions. A negative result may occur with  improper specimen collection/handling, submission of specimen other than nasopharyngeal swab, presence of viral mutation(s) within the areas targeted by this assay, and inadequate number of viral copies(<138 copies/mL). A negative result must be combined with clinical observations, patient history, and epidemiological information. The expected result is Negative.  Fact Sheet for Patients:  EntrepreneurPulse.com.au  Fact Sheet for Healthcare Providers:  IncredibleEmployment.be  This test is no t yet approved or cleared by the [place] FDA and  has been authorized for detection and/or diagnosis of SARS-CoV-2 by FDA under an Emergency Use Authorization (EUA). This EUA will remain  in effect (meaning this test can be used) for the duration of the COVID-19 declaration under Section 564(b)(1) of the Act, 21 U.S.C.section 360bbb-3(b)(1), unless the authorization is terminated  or revoked sooner.       Influenza A by PCR NEGATIVE NEGATIVE Final   Influenza B by PCR NEGATIVE NEGATIVE Final    Comment: (NOTE) The Xpert Xpress SARS-CoV-2/FLU/RSV plus assay is intended as an aid in the diagnosis of influenza from Nasopharyngeal swab specimens and should not be used as a sole basis for treatment. Nasal washings and aspirates are unacceptable for Xpert Xpress SARS-CoV-2/FLU/RSV testing.  Fact Sheet for Patients: EntrepreneurPulse.com.au  Fact Sheet for Healthcare Providers: IncredibleEmployment.be  This test is not yet approved or cleared by the [place] FDA and has been authorized for detection and/or diagnosis of SARS-CoV-2 by FDA under an Emergency Use Authorization (EUA). This EUA will remain in effect (meaning this test can be used) for the duration of the COVID-19 declaration under Section 564(b)(1) of  the Act, 21 U.S.C. section 360bbb-3(b)(1), unless the authorization is terminated or revoked.  Performed at [facility], [place] [address] [place], [place] [address]   MRSA Next Gen by PCR, Nasal     Status: None   Collection Time: 09/16/20  9:32 PM   Specimen:  Nasal Mucosa; Nasal Swab  Result Value Ref Range Status   MRSA by PCR Next Gen NOT DETECTED NOT DETECTED Final    Comment: (NOTE) The GeneXpert MRSA Assay (FDA approved for NASAL specimens only), is one component of a comprehensive MRSA colonization surveillance program. It is not intended to diagnose MRSA infection nor to guide or monitor treatment for MRSA infections. Test performance is not FDA approved in patients less than 20 years old. Performed at [facility], [place] [address] [place], [place] [address]           Radiology Studies: CARDIAC CATHETERIZATION  Result Date: 09/20/2020 Conclusions: 1. Severe three-vessel coronary artery disease, including chronic total occlusion of proximal LAD with left-to-left collaterals, 75% ostial/proximal ramus intermedius stenosis, and sequential 75% ostial and 90% distal RCA lesions.  LCx and OM3 have mild to moderate disease of up to 40%. 2. Moderately elevated left ventricular filling pressure (LVEDP ~30 mmHg). 3. Mild aortic stenosis (peak-to-peak gradient 10-15 mmHg. Recommendations: 1. Imaged reviewed with Dr. Burt Knack during the procedure.  We will plan for cardiac surgery consultation for CABG given severe LAD and RCA disease with good distal targets in both vessels. 2. Defer reinitiation of IV heparin, as the coronary artery disease appears chronic and hemoglobin has trended down significantly since admission. 3. Aggressive secondary prevention. 4. Diurese with furosemide 40 mg IV x 1.  Further diureses to be based on urine output, volume status, and renal function. Nelva Bush, MD [facility]   DG CHEST PORT 1 VIEW  Result Date: 09/21/2020 CLINICAL DATA:   75 year old male with fever. Shortness of breath and chest pain. EXAM: PORTABLE CHEST 1 VIEW COMPARISON:  Portable chest 09/17/2020 and earlier. FINDINGS: Portable AP semi upright view at 0802 hours. Extubated and enteric tube removed. Stable lung volumes. Normal cardiac size and mediastinal contours. Visualized tracheal air column is within normal limits. Right upper lobe calcified granuloma. Otherwise Allowing for portable technique the lungs are clear. No pneumothorax. No acute osseous abnormality identified. Negative visible bowel gas pattern. IMPRESSION: 1. Extubated and enteric tube removed. 2. No acute cardiopulmonary abnormality. Electronically Signed   By: Genevie Ann M.D.   On: 09/21/2020 08:11   VAS US DOPPLER PRE CABG  Result Date: 09/21/2020 PREOPERATIVE VASCULAR EVALUATION Patient Name:  Brian Robinson  Date of Exam:   09/21/2020 Medical Rec #: [id number]       Accession #:    [id number] Date of Birth: 1945/09/28        Patient Gender: M Patient Age:   75Y [facility]am [facility] Procedure:      VAS US DOPPLER PRE CABG Referring Phys: Davis --------------------------------------------------------------------------------  Indications:      Pre-CABG. Risk Factors:     Hypertension, Diabetes. Comparison Study: no prior Performing Technologist: Archie Patten RVS  Examination Guidelines: A complete evaluation includes B-mode imaging, spectral Doppler, color Doppler, and power Doppler as needed of all accessible portions of each vessel. Bilateral testing is considered an integral part of a complete examination. Limited examinations for reoccurring indications may be performed as noted.  Right Carotid Findings: +----------+-------+-------+--------+---------------------------------+--------+           PSV    EDV    StenosisDescribe                         Comments           cm/s   cm/s                                                      +----------+-------+-------+--------+---------------------------------+--------+  CCA Prox  108    13             heterogenous                              +----------+-------+-------+--------+---------------------------------+--------+ CCA Distal48     13             heterogenous                              +----------+-------+-------+--------+---------------------------------+--------+ ICA Prox  127    50     40-59%  heterogenous, irregular and                                               calcific                                  +----------+-------+-------+--------+---------------------------------+--------+ ICA Mid   137    39                                                       +----------+-------+-------+--------+---------------------------------+--------+ ICA Distal93     23                                                       +----------+-------+-------+--------+---------------------------------+--------+ ECA       84                                                              +----------+-------+-------+--------+---------------------------------+--------+ Portions of this table do not appear on this page. +----------+--------+-------+--------+------------+           PSV cm/sEDV cmsDescribeArm Pressure +----------+--------+-------+--------+------------+ Subclavian74                                  +----------+--------+-------+--------+------------+ +---------+--------+--+--------+--+---------+ VertebralPSV cm/s41EDV cm/s15Antegrade +---------+--------+--+--------+--+---------+ Left Carotid Findings: +----------+--------+--------+--------+------------+--------+           PSV cm/sEDV cm/sStenosisDescribe    Comments +----------+--------+--------+--------+------------+--------+ CCA Prox  86      20              heterogenous         +----------+--------+--------+--------+------------+--------+ CCA Distal62      14               heterogenous         +----------+--------+--------+--------+------------+--------+ ICA Prox  77      24      1-39%   heterogenoustortuous +----------+--------+--------+--------+------------+--------+ ICA Distal71      21                                   +----------+--------+--------+--------+------------+--------+  ECA       90      13                                   +----------+--------+--------+--------+------------+--------+ +----------+--------+--------+--------+------------+ SubclavianPSV cm/sEDV cm/sDescribeArm Pressure +----------+--------+--------+--------+------------+           74                                   +----------+--------+--------+--------+------------+ +---------+--------+--+--------+--+---------+ VertebralPSV cm/s43EDV cm/s12Antegrade +---------+--------+--+--------+--+---------+  ABI Findings: +--------+------------------+-----+---------+--------+ Right   Rt Pressure (mmHg)IndexWaveform Comment  +--------+------------------+-----+---------+--------+ [id number]                    triphasic         +--------+------------------+-----+---------+--------+ PTA     255               2.06 triphasic         +--------+------------------+-----+---------+--------+ DP      161               1.30 triphasic         +--------+------------------+-----+---------+--------+ +--------+------------------+-----+---------+-------+ Left    Lt Pressure (mmHg)IndexWaveform Comment +--------+------------------+-----+---------+-------+ [id number]                    triphasic        +--------+------------------+-----+---------+-------+ PTA     255               2.06 triphasic        +--------+------------------+-----+---------+-------+ DP      251               2.02 triphasic        +--------+------------------+-----+---------+-------+ +-------+---------------+----------------+ ABI/TBIToday's ABI/TBIPrevious ABI/TBI  +-------+---------------+----------------+ Right  2.06                            +-------+---------------+----------------+ Left   2.06                            +-------+---------------+----------------+  Right Doppler Findings: +--------+--------+-----+---------+--------+ Site    PressureIndexDoppler  Comments +--------+--------+-----+---------+--------+ [id number]          triphasic         +--------+--------+-----+---------+--------+ Radial               triphasic         +--------+--------+-----+---------+--------+ Ulnar                triphasic         +--------+--------+-----+---------+--------+  Left Doppler Findings: +--------+--------+-----+---------+--------+ Site    PressureIndexDoppler  Comments +--------+--------+-----+---------+--------+ [id number]          triphasic         +--------+--------+-----+---------+--------+ Radial               triphasic         +--------+--------+-----+---------+--------+ Ulnar                triphasic         +--------+--------+-----+---------+--------+  Summary: Right Carotid: Velocities in the right ICA are consistent with a 40-59%                stenosis. Left Carotid: Velocities in the left ICA are consistent with a 1-39% stenosis. Vertebrals: Bilateral vertebral arteries demonstrate antegrade flow. Right  ABI: Resting right ankle-brachial index indicates noncompressible right lower extremity arteries. Left ABI: Resting left ankle-brachial index indicates noncompressible left lower extremity arteries. Right Upper Extremity: Doppler waveforms remain within normal limits with right radial compression. Doppler waveforms decrease >50% with right ulnar compression. Left Upper Extremity: Doppler waveform obliterate with left radial compression. Doppler waveforms decrease 50% with left ulnar compression.  Electronically signed by Deitra Mayo MD on 09/21/2020 at 12:19:25 PM.    Final    VAS [place] LOWER  EXTREMITY VENOUS (DVT)  Result Date: 09/21/2020  Lower Venous DVT Study Patient Name:  Brian Robinson  Date of Exam:   09/21/2020 Medical Rec #: [id number]       Accession #:    [id number] Date of Birth: 04-Jul-1945        Patient Gender: M Patient Age:   75Y Exam Location:  [facility] Procedure:      VAS [place] LOWER EXTREMITY VENOUS (DVT) Referring Phys: [id number] Leslee Home --------------------------------------------------------------------------------  Indications: Swelling, and Edema.  Comparison Study: no prior Performing Technologist: Archie Patten RVS  Examination Guidelines: A complete evaluation includes B-mode imaging, spectral Doppler, color Doppler, and power Doppler as needed of all accessible portions of each vessel. Bilateral testing is considered an integral part of a complete examination. Limited examinations for reoccurring indications may be performed as noted. The reflux portion of the exam is performed with the patient in reverse Trendelenburg.  +---------+---------------+---------+-----------+----------+--------------+ RIGHT    CompressibilityPhasicitySpontaneityPropertiesThrombus Aging +---------+---------------+---------+-----------+----------+--------------+ CFV      Full           Yes      Yes                                 +---------+---------------+---------+-----------+----------+--------------+ SFJ      Full                                                        +---------+---------------+---------+-----------+----------+--------------+ FV Prox  Full                                                        +---------+---------------+---------+-----------+----------+--------------+ FV Mid   Full                                                        +---------+---------------+---------+-----------+----------+--------------+ FV DistalFull                                                         +---------+---------------+---------+-----------+----------+--------------+ PFV      Full                                                        +---------+---------------+---------+-----------+----------+--------------+  POP      Full           Yes      Yes                                 +---------+---------------+---------+-----------+----------+--------------+ PTV      Full                                                        +---------+---------------+---------+-----------+----------+--------------+ PERO     Full                                                        +---------+---------------+---------+-----------+----------+--------------+   +---------+---------------+---------+-----------+----------+--------------+ LEFT     CompressibilityPhasicitySpontaneityPropertiesThrombus Aging +---------+---------------+---------+-----------+----------+--------------+ CFV      Full           Yes      Yes                                 +---------+---------------+---------+-----------+----------+--------------+ SFJ      Full                                                        +---------+---------------+---------+-----------+----------+--------------+ FV Prox  Full                                                        +---------+---------------+---------+-----------+----------+--------------+ FV Mid   Full                                                        +---------+---------------+---------+-----------+----------+--------------+ FV DistalFull                                                        +---------+---------------+---------+-----------+----------+--------------+ PFV      Full                                                        +---------+---------------+---------+-----------+----------+--------------+ POP      Full           Yes      Yes                                  +---------+---------------+---------+-----------+----------+--------------+  PTV      Full                                                        +---------+---------------+---------+-----------+----------+--------------+ PERO     Full                                                        +---------+---------------+---------+-----------+----------+--------------+     Summary: BILATERAL: - No evidence of deep vein thrombosis seen in the lower extremities, bilaterally. -No evidence of popliteal cyst, bilaterally.   *See table(s) above for measurements and observations. Electronically signed by Deitra Mayo MD on 09/21/2020 at 12:19:02 PM.    Final         Scheduled Meds:  amLODipine  5 mg Oral Daily   aspirin EC  81 mg Oral Daily   Chlorhexidine Gluconate Cloth  6 each Topical Daily   [START ON 09/23/2020] epinephrine  0-10 mcg/min Intravenous To OR   feeding supplement  237 mL Oral BID BM   [START ON 09/23/2020] heparin-papaverine-plasmalyte irrigation   Irrigation To OR   [START ON 09/23/2020] insulin   Intravenous To OR   lidocaine  1 patch Transdermal Q24H   magnesium oxide  400 mg Oral BID   [START ON 09/23/2020] magnesium sulfate  40 mEq Other To OR   mouth rinse  15 mL Mouth Rinse BID   pantoprazole  40 mg Oral Daily   [START ON 09/23/2020] phenylephrine  30-200 mcg/min Intravenous To OR   [START ON 09/23/2020] potassium chloride  80 mEq Other To OR   rosuvastatin  20 mg Oral Daily   senna-docusate  1 tablet Oral Daily   sertraline  50 mg Oral Daily   sodium chloride flush  3 mL Intravenous Q12H   [START ON 09/23/2020] tranexamic acid  15 mg/kg Intravenous To OR   [START ON 09/23/2020] tranexamic acid  2 mg/kg Intracatheter To OR   vitamin B-12  1,000 mcg Oral Daily   Continuous Infusions:  sodium chloride 1,000 mL (09/21/20 1559)   sodium chloride     [START ON 09/23/2020]  ceFAZolin (ANCEF) IV     [START ON 09/23/2020]  ceFAZolin (ANCEF) IV     cefTRIAXone  (ROCEPHIN)  IV 1 g (09/21/20 1601)   [START ON 09/23/2020] dexmedetomidine     [START ON 09/23/2020] heparin 30,000 units/NS 1000 mL solution for CELLSAVER     heparin 1,750 Units/hr (09/21/20 2140)   [START ON 09/23/2020] milrinone     [START ON 09/23/2020] nitroGLYCERIN     [START ON 09/23/2020] norepinephrine     [START ON 09/23/2020] tranexamic acid (CYKLOKAPRON) infusion ([facility])     [START ON 09/23/2020] vancomycin       LOS: 6 days    Time spent: 35 minutes.     Elmarie Shiley, MD [facility]   If 7PM-7AM, please contact night-coverage [email or web address]  09/22/2020, 8:12 AM

## 2020-09-22 NOTE — Progress Notes (Addendum)
Progress Note  Patient Name: Brian Robinson Date of Encounter: 09/22/2020  [facility] Cardiologist: New to Dr Claiborne Billings   Subjective   Patient is anxious this morning, he states he is worried about everything. Wife states patient suffered some short term memory loss, but overall neuro exam is improving. He is able to carry conversation appropriately. He reports chest wall tenderness. He denied any hematemesis, hematochezia, hematuria, abdominal pain. He is waiting to for for CT.    Inpatient Medications    Scheduled Meds:  amLODipine  5 mg Oral Daily   aspirin EC  81 mg Oral Daily   Chlorhexidine Gluconate Cloth  6 each Topical Daily   [START ON 09/23/2020] epinephrine  0-10 mcg/min Intravenous To OR   feeding supplement  237 mL Oral BID BM   [START ON 09/23/2020] heparin-papaverine-plasmalyte irrigation   Irrigation To OR   [START ON 09/23/2020] insulin   Intravenous To OR   lidocaine  1 patch Transdermal Q24H   magnesium oxide  400 mg Oral BID   [START ON 09/23/2020] magnesium sulfate  40 mEq Other To OR   mouth rinse  15 mL Mouth Rinse BID   pantoprazole  40 mg Oral Daily   [START ON 09/23/2020] phenylephrine  30-200 mcg/min Intravenous To OR   [START ON 09/23/2020] potassium chloride  80 mEq Other To OR   rosuvastatin  20 mg Oral Daily   senna-docusate  1 tablet Oral Daily   sertraline  50 mg Oral Daily   sodium chloride flush  3 mL Intravenous Q12H   [START ON 09/23/2020] tranexamic acid  15 mg/kg Intravenous To OR   [START ON 09/23/2020] tranexamic acid  2 mg/kg Intracatheter To OR   vitamin B-12  1,000 mcg Oral Daily   Continuous Infusions:  sodium chloride 1,000 mL (09/21/20 1559)   sodium chloride     [START ON 09/23/2020]  ceFAZolin (ANCEF) IV     [START ON 09/23/2020]  ceFAZolin (ANCEF) IV     cefTRIAXone (ROCEPHIN)  IV 1 g (09/21/20 1601)   [START ON 09/23/2020] dexmedetomidine     [START ON 09/23/2020] heparin 30,000 units/NS 1000 mL solution for CELLSAVER     heparin  1,750 Units/hr (09/21/20 2140)   [START ON 09/23/2020] milrinone     [START ON 09/23/2020] nitroGLYCERIN     [START ON 09/23/2020] norepinephrine     [START ON 09/23/2020] tranexamic acid (CYKLOKAPRON) infusion ([facility])     [START ON 09/23/2020] vancomycin     PRN Meds: sodium chloride, sodium chloride, acetaminophen, diphenhydrAMINE, docusate sodium, HYDROcodone-acetaminophen, labetalol, morphine injection, ondansetron (ZOFRAN) IV, polyethylene glycol, sodium chloride flush   Vital Signs    Vitals:   09/21/20 0839 09/21/20 1149 09/21/20 2021 09/22/20 0441  BP: 113/60 125/67 118/70 110/82  Pulse: 89 94 (!) 101 97  Resp: 18 18 16 16   Temp: 98.3 F (36.8 C) 98.6 F (37 C) 100.3 F (37.9 C) 99.5 F (37.5 C)  TempSrc: Oral Oral Oral Oral  SpO2: 95% 97% 92% 95%  Weight:    90.2 kg  Height:        Intake/Output Summary (Last 24 hours) at 09/22/2020 0804 Last data filed at 09/22/2020 0656 Gross per 24 hour  Intake 1002.89 ml  Output 620 ml  Net 382.89 ml   Last 3 Weights 09/22/2020 09/21/2020 09/20/2020  Weight (lbs) 198 lb 12.8 oz 191 lb 191 lb 0.1 oz  Weight (kg) 90.175 kg 86.637 kg 86.639 kg      Telemetry  Sinus tachycardia 100-130s, with short runs of atrial tachycardia noted, occasional PVCs  - Personally Reviewed  ECG    N/A - Personally Reviewed  Physical Exam   GEN: No acute distress.  Pallor. Conjunctival pallor bilaterally.  Neck: No JVD Cardiac: RRR, no murmurs, rubs, or gallops. Chest wall tenderness + Respiratory: Clear to auscultation bilaterally. On Room air.  GI: Soft, nontender, non-distended  MS: LLE 1+ edema; No deformity. Neuro:  A+Ox3, carry conversation appropriately   Psych: Anxious    Labs    High Sensitivity Troponin:   Recent Labs  Lab 09/16/20 1726 09/16/20 1927 09/17/20 0829 09/17/20 1059  TROPONINIHS 155* 1,177* 831* 665*      Chemistry Recent Labs  Lab 09/19/20 0344 09/20/20 0431 09/21/20 0219 09/22/20 0349  NA 135 130* 132*  129*  K 3.8 3.5 3.8 3.5  CL 96* 91* 94* 94*  CO2 29 28 26 25   GLUCOSE 139* 117* 138* 124*  BUN 26* 28* 29* 39*  CREATININE 0.92 0.86 1.09 1.19  CALCIUM 8.8* 8.8* 8.9 8.6*  PROT 6.7 6.5 7.0  --   ALBUMIN 3.1* 3.2* 3.3*  --   AST 80* 133* 131*  --   ALT 42 42 43  --   ALKPHOS 54 47 50  --   BILITOT 1.1 1.3* 1.4*  --   GFRNONAA >60 >60 >60 >60  ANIONGAP 10 11 12 10      Hematology Recent Labs  Lab 09/20/20 0431 09/21/20 0219 09/22/20 0349  WBC 13.8* 16.3* 18.6*  RBC 2.89* 3.08* 2.56*  HGB 8.4* 9.2* 7.5*  HCT 24.9* 26.7* 22.2*  MCV 86.2 86.7 86.7  MCH 29.1 29.9 29.3  MCHC 33.7 34.5 33.8  RDW 13.1 13.5 13.9  PLT 165 196 209    BNPNo results for input(s): BNP, PROBNP in the last 168 hours.   DDimer No results for input(s): DDIMER in the last 168 hours.   Radiology    CARDIAC CATHETERIZATION  Result Date: 09/20/2020 Conclusions: 1. Severe three-vessel coronary artery disease, including chronic total occlusion of proximal LAD with left-to-left collaterals, 75% ostial/proximal ramus intermedius stenosis, and sequential 75% ostial and 90% distal RCA lesions.  LCx and OM3 have mild to moderate disease of up to 40%. 2. Moderately elevated left ventricular filling pressure (LVEDP ~30 mmHg). 3. Mild aortic stenosis (peak-to-peak gradient 10-15 mmHg. Recommendations: 1. Imaged reviewed with Dr. Burt Knack during the procedure.  We will plan for cardiac surgery consultation for CABG given severe LAD and RCA disease with good distal targets in both vessels. 2. Defer reinitiation of IV heparin, as the coronary artery disease appears chronic and hemoglobin has trended down significantly since admission. 3. Aggressive secondary prevention. 4. Diurese with furosemide 40 mg IV x 1.  Further diureses to be based on urine output, volume status, and renal function. Nelva Bush, MD [facility]   DG CHEST PORT 1 VIEW  Result Date: 09/21/2020 CLINICAL DATA:  75 year old male with fever.  Shortness of breath and chest pain. EXAM: PORTABLE CHEST 1 VIEW COMPARISON:  Portable chest 09/17/2020 and earlier. FINDINGS: Portable AP semi upright view at 0802 hours. Extubated and enteric tube removed. Stable lung volumes. Normal cardiac size and mediastinal contours. Visualized tracheal air column is within normal limits. Right upper lobe calcified granuloma. Otherwise Allowing for portable technique the lungs are clear. No pneumothorax. No acute osseous abnormality identified. Negative visible bowel gas pattern. IMPRESSION: 1. Extubated and enteric tube removed. 2. No acute cardiopulmonary abnormality. Electronically Signed   By: Lemmie Evens  [place] Crane M.D.   On: 09/21/2020 08:11   VAS US DOPPLER PRE CABG  Result Date: 09/21/2020 PREOPERATIVE VASCULAR EVALUATION Patient Name:  Brian Robinson  Date of Exam:   09/21/2020 Medical Rec #: [id number]       Accession #:    [id number] Date of Birth: 21-Apr-1945        Patient Gender: M Patient Age:   75Y Exam Location:  [facility] Procedure:      VAS US DOPPLER PRE CABG Referring Phys: [place] --------------------------------------------------------------------------------  Indications:      Pre-CABG. Risk Factors:     Hypertension, Diabetes. Comparison Study: no prior Performing Technologist: Archie Patten RVS  Examination Guidelines: A complete evaluation includes B-mode imaging, spectral Doppler, color Doppler, and power Doppler as needed of all accessible portions of each vessel. Bilateral testing is considered an integral part of a complete examination. Limited examinations for reoccurring indications may be performed as noted.  Right Carotid Findings: +----------+-------+-------+--------+---------------------------------+--------+           PSV    EDV    StenosisDescribe                         Comments           cm/s   cm/s                                                      +----------+-------+-------+--------+---------------------------------+--------+ CCA Prox  108    13             heterogenous                              +----------+-------+-------+--------+---------------------------------+--------+ CCA Distal48     13             heterogenous                              +----------+-------+-------+--------+---------------------------------+--------+ ICA Prox  127    50     40-59%  heterogenous, irregular and                                               calcific                                  +----------+-------+-------+--------+---------------------------------+--------+ ICA Mid   137    39                                                       +----------+-------+-------+--------+---------------------------------+--------+ ICA Distal93     23                                                       +----------+-------+-------+--------+---------------------------------+--------+  ECA       84                                                              +----------+-------+-------+--------+---------------------------------+--------+ Portions of this table do not appear on this page. +----------+--------+-------+--------+------------+           PSV cm/sEDV cmsDescribeArm Pressure +----------+--------+-------+--------+------------+ Subclavian74                                  +----------+--------+-------+--------+------------+ +---------+--------+--+--------+--+---------+ VertebralPSV cm/s41EDV cm/s15Antegrade +---------+--------+--+--------+--+---------+ Left Carotid Findings: +----------+--------+--------+--------+------------+--------+           PSV cm/sEDV cm/sStenosisDescribe    Comments +----------+--------+--------+--------+------------+--------+ CCA Prox  86      20              heterogenous         +----------+--------+--------+--------+------------+--------+ CCA Distal62      14               heterogenous         +----------+--------+--------+--------+------------+--------+ ICA Prox  77      24      1-39%   heterogenoustortuous +----------+--------+--------+--------+------------+--------+ ICA Distal71      21                                   +----------+--------+--------+--------+------------+--------+ ECA       90      13                                   +----------+--------+--------+--------+------------+--------+ +----------+--------+--------+--------+------------+ SubclavianPSV cm/sEDV cm/sDescribeArm Pressure +----------+--------+--------+--------+------------+           74                                   +----------+--------+--------+--------+------------+ +---------+--------+--+--------+--+---------+ VertebralPSV cm/s43EDV cm/s12Antegrade +---------+--------+--+--------+--+---------+  ABI Findings: +--------+------------------+-----+---------+--------+ Right   Rt Pressure (mmHg)IndexWaveform Comment  +--------+------------------+-----+---------+--------+ [id number]                    triphasic         +--------+------------------+-----+---------+--------+ PTA     255               2.06 triphasic         +--------+------------------+-----+---------+--------+ DP      161               1.30 triphasic         +--------+------------------+-----+---------+--------+ +--------+------------------+-----+---------+-------+ Left    Lt Pressure (mmHg)IndexWaveform Comment +--------+------------------+-----+---------+-------+ [id number]                    triphasic        +--------+------------------+-----+---------+-------+ PTA     255               2.06 triphasic        +--------+------------------+-----+---------+-------+ DP      251               2.02 triphasic        +--------+------------------+-----+---------+-------+ +-------+---------------+----------------+ ABI/TBIToday's  ABI/TBIPrevious ABI/TBI  +-------+---------------+----------------+ Right  2.06                            +-------+---------------+----------------+ Left   2.06                            +-------+---------------+----------------+  Right Doppler Findings: +--------+--------+-----+---------+--------+ Site    PressureIndexDoppler  Comments +--------+--------+-----+---------+--------+ [id number]          triphasic         +--------+--------+-----+---------+--------+ Radial               triphasic         +--------+--------+-----+---------+--------+ Ulnar                triphasic         +--------+--------+-----+---------+--------+  Left Doppler Findings: +--------+--------+-----+---------+--------+ Site    PressureIndexDoppler  Comments +--------+--------+-----+---------+--------+ [id number]          triphasic         +--------+--------+-----+---------+--------+ Radial               triphasic         +--------+--------+-----+---------+--------+ Ulnar                triphasic         +--------+--------+-----+---------+--------+  Summary: Right Carotid: Velocities in the right ICA are consistent with a 40-59%                stenosis. Left Carotid: Velocities in the left ICA are consistent with a 1-39% stenosis. Vertebrals: Bilateral vertebral arteries demonstrate antegrade flow. Right ABI: Resting right ankle-brachial index indicates noncompressible right lower extremity arteries. Left ABI: Resting left ankle-brachial index indicates noncompressible left lower extremity arteries. Right Upper Extremity: Doppler waveforms remain within normal limits with right radial compression. Doppler waveforms decrease >50% with right ulnar compression. Left Upper Extremity: Doppler waveform obliterate with left radial compression. Doppler waveforms decrease 50% with left ulnar compression.  Electronically signed by Deitra Mayo MD on 09/21/2020 at 12:19:25 PM.    Final    VAS [place] LOWER  EXTREMITY VENOUS (DVT)  Result Date: 09/21/2020  Lower Venous DVT Study Patient Name:  Brian Robinson  Date of Exam:   09/21/2020 Medical Rec #: [id number]       Accession #:    [id number] Date of Birth: 1945/09/01        Patient Gender: M Patient Age:   75Y Exam Location:  [facility] Procedure:      VAS [place] LOWER EXTREMITY VENOUS (DVT) Referring Phys: [id number] Leslee Home --------------------------------------------------------------------------------  Indications: Swelling, and Edema.  Comparison Study: no prior Performing Technologist: Archie Patten RVS  Examination Guidelines: A complete evaluation includes B-mode imaging, spectral Doppler, color Doppler, and power Doppler as needed of all accessible portions of each vessel. Bilateral testing is considered an integral part of a complete examination. Limited examinations for reoccurring indications may be performed as noted. The reflux portion of the exam is performed with the patient in reverse Trendelenburg.  +---------+---------------+---------+-----------+----------+--------------+ RIGHT    CompressibilityPhasicitySpontaneityPropertiesThrombus Aging +---------+---------------+---------+-----------+----------+--------------+ CFV      Full           Yes      Yes                                 +---------+---------------+---------+-----------+----------+--------------+ SFJ  Full                                                        +---------+---------------+---------+-----------+----------+--------------+ FV Prox  Full                                                        +---------+---------------+---------+-----------+----------+--------------+ FV Mid   Full                                                        +---------+---------------+---------+-----------+----------+--------------+ FV DistalFull                                                         +---------+---------------+---------+-----------+----------+--------------+ PFV      Full                                                        +---------+---------------+---------+-----------+----------+--------------+ POP      Full           Yes      Yes                                 +---------+---------------+---------+-----------+----------+--------------+ PTV      Full                                                        +---------+---------------+---------+-----------+----------+--------------+ PERO     Full                                                        +---------+---------------+---------+-----------+----------+--------------+   +---------+---------------+---------+-----------+----------+--------------+ LEFT     CompressibilityPhasicitySpontaneityPropertiesThrombus Aging +---------+---------------+---------+-----------+----------+--------------+ CFV      Full           Yes      Yes                                 +---------+---------------+---------+-----------+----------+--------------+ SFJ      Full                                                        +---------+---------------+---------+-----------+----------+--------------+  FV Prox  Full                                                        +---------+---------------+---------+-----------+----------+--------------+ FV Mid   Full                                                        +---------+---------------+---------+-----------+----------+--------------+ FV DistalFull                                                        +---------+---------------+---------+-----------+----------+--------------+ PFV      Full                                                        +---------+---------------+---------+-----------+----------+--------------+ POP      Full           Yes      Yes                                  +---------+---------------+---------+-----------+----------+--------------+ PTV      Full                                                        +---------+---------------+---------+-----------+----------+--------------+ PERO     Full                                                        +---------+---------------+---------+-----------+----------+--------------+     Summary: BILATERAL: - No evidence of deep vein thrombosis seen in the lower extremities, bilaterally. -No evidence of popliteal cyst, bilaterally.   *See table(s) above for measurements and observations. Electronically signed by Deitra Mayo MD on 09/21/2020 at 12:19:02 PM.    Final     Cardiac Studies   LHC on 09/20/20:  Conclusions: Severe three-vessel coronary artery disease, including chronic total occlusion of proximal LAD with left-to-left collaterals, 75% ostial/proximal ramus intermedius stenosis, and sequential 75% ostial and 90% distal RCA lesions.  LCx and OM3 have mild to moderate disease of up to 40%. Moderately elevated left ventricular filling pressure (LVEDP ~30 mmHg). Mild aortic stenosis (peak-to-peak gradient 10-15 mmHg.   Recommendations: Imaged reviewed with Dr. Burt Knack during the procedure.  We will plan for cardiac surgery consultation for CABG given severe LAD and RCA disease with good distal targets in both vessels. Defer reinitiation of IV heparin, as the coronary artery disease appears chronic and hemoglobin has trended down significantly since admission. Aggressive  secondary prevention. Diurese with furosemide 40 mg IV x 1.  Further diureses to be based on urine output, volume status, and renal function.  Echo from 09/17/20:   1. Mild hypokinesis of the distal septum with overall preserved LV  function; calcified aortic valve with mild AS by doppler but visually  looks moderate.   2. Left ventricular ejection fraction, by estimation, is 55 to 60%. The  left ventricle has normal  function. The left ventricle demonstrates  regional wall motion abnormalities (see scoring diagram/findings for  description). There is mild left ventricular   hypertrophy. Left ventricular diastolic parameters are consistent with  Grade II diastolic dysfunction (pseudonormalization). Elevated left atrial  pressure.   3. Right ventricular systolic function is normal. The right ventricular  size is normal.   4. The mitral valve is normal in structure. Trivial mitral valve  regurgitation. No evidence of mitral stenosis. Moderate mitral annular  calcification.   5. The aortic valve is calcified. Aortic valve regurgitation is not  visualized. Mild aortic valve stenosis.   6. The inferior vena cava is normal in size with greater than 50%  respiratory variability, suggesting right atrial pressure of 3 mmHg.   Patient Profile     75 y.o. male with PMH of HTN, type 2 DM, mild aortic stenosis, COVID-19 on 01/2020, cardiology is consulted and following for witnessed cardiac arrest since 09/17/20.   Assessment & Plan    Witnessed out of hospital cardiac arrest  Presumed  VT/VF from history (required defibrillator shock at scene) NSTEMI  Multivessel CAD  - see initial consult note for history  - Hs trop 155 > 1177 >831 >665 - Echo from 09/17/20 LVEF 55-60%, Mild hypokinesis of the distal septum, mild LVH, grade II DD, trivial MR, mild AS - left cardiac cath 09/20/20: severe three vessel CAD including chronic total occlusion of proximal LAD with left-to-left collaterals, 75% ostial/proximal ramus intermedius stenosis, and sequential 75% ostial and 90% distal RCA lesions.  LCx and OM3 have mild to moderate disease of up to 40%. - planned for CABG on 09/23/20 by CTS Dr. Leonarda Salon - continue medical therapy including crestor 20mg  , ASA 81mg , heparin gtt held currently due to worsening anemia and workup pending rule out bleeding, will need to add beta blocker, defer to MD  - will optimize K with PO  supplement today, Mag at goal, keep Mag >2 and K > 4  Elevated LVEDP - cardiac cath 09/20/20: with LVEDP elevated 30 mmHg - s/p IV Lasix bolus intermittently , last dose 09/21/20  - clinically euvolemic,  will diuresis as needed based on volume status, consider hold further diuresis today given rising Cr with hyponatremia   Sinus tachycardia with runs of AT  - noted on telemetry with HR 100-130s and runs of atrial tachycardia  - query sepsis/fever versus hemorrhage induced , treat underlying cause   Junctional bradycardia, resolved  - occurred initially at admission with sedation, has resolved now  Mild aortic stenosis  -Mild aortic stenosis peak-to-peak gradient 10-15 mmHg on cardiac cath 6/27 -will need to weigh additional surgical risk versus potential benefit of valve replacement concomitant with CABG  HTN - BP controlled on amlodipine 5mg    LLE edema: DVT negative  Presumed UTI: WBC rising, febrile, urine culture pending, on CTX  Acute on chronic Anemia: FOBT negative 09/20/20, Hgb dropping to 7.5 today, ferritin not low,  may confirm with repeat H&H, no bleed reported clinically, CTAP pending, may need GI evaluation  Anoxic encephalopathy: neuro  exam is much improving near baseline per wife Depression/anxiety: very anxious today Transaminitis mild: improving, likely due to shock liver, trend LFTs  Hyponatremia : worsening, possible due to dehydration/lasix use from lab review, consider workup if worsening  - management  per IM      For questions or updates, please contact [place] Please consult [email or web address] for contact info under        Signed, Margie Billet, NP  09/22/2020, 8:04 AM    Patient seen, examined. Available data reviewed. Agree with findings, assessment, and plan as outlined by Margie Billet, NP.  The patient is independently interviewed and examined.  He is sitting up at the bedside in a chair.  His wife and daughter are at the bedside.  He is alert, oriented,  in no distress.  Lungs are clear, heart is regular rate and rhythm with 2/6 systolic murmur at the RUSB, abdomen soft nontender, extremities have 1+ edema on the left and trace edema on the right.  Overall the patient is doing very well.  Clinically he looks better than he has over the past few days.  Issues are outlined above with anemia, left leg swelling, and leukocytosis.  He has been fully worked up by ID and is felt to be clear for cardiac surgery.  I think it is okay to keep him off of heparin until surgery as he has having no recurrent angina.  CT of the abdomen and pelvis are reviewed and there is no evidence of retroperitoneal hemorrhage.  Overall the patient appears stable for cardiac surgery tomorrow.  Decision will be made at the time of surgery based on cardiac imaging whether he requires concomitant aortic valve replacement.  Appreciate Dr. Leonarda Salon care.  Sherren Mocha, M.D. 09/22/2020 2:04 PM

## 2020-09-22 NOTE — Consult Note (Signed)
[name]    Date of Admission:  09/16/2020     Reason for Consult: leukocytosis/fever    Referring Provider: Jerald Kief   Lines:  Peripheral iv  Abx: 6/28-c ceftriaxone 6/28-c vanc        Assessment: 75 yo male htn, dm2, admitted 6/23 after a witnessed cardiac arrest at home s/p ACLS (1 shock) with rosc, found to have nstemi, course complicated by leukocytosis and low grade fever which id is called  Cxr clear on 6/28. No localizing s/s of infectious syndrome. Would be unusual for an occult abscess in traabdominal at this time. Sepsis w/u started by primary team (bcx obtained after abx initiated). Suspect related to nstemi/cpr-rib fx  Reasonable to keep empiric abx for another day pending bcx result. Shouldn't contraindicate his planned cabg tomorrow  Plan: F/u abd/pelv ct F/u bcx report Maintain on vanc/ceftriaxone From id standpoint no contraindication for cabg at this time Discussed with primary team   I spent 60 minute reviewing data/chart, and coordinating care and >50% direct face to face time providing counseling/discussing diagnostics/treatment plan with patient       ------------------------------------------------ Active Problems:   Cardiac arrest [facility])   Encephalopathy acute    HPI: Brian Robinson is a 75 y.o. 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  Hx via patient/note/sign out  Patient has been stressed with financial situation. On 6/23 after looking at some bill witnessed by his wife to 'drop'. Ems called/wife started cpr prior to arrival of medics 5-10 minutes later. Shocked once on acls with rosc.   Hospital course: Trops peaked 1177 Ekg c/w nstemi Cards/cts following Intubated for less than a day for airway protection/ams post resuscitation 6/26 low grade temp till 6/28 and wbc of 13 on admission trended up to 22 on 6/29 Patient given  vanc/ctrx on 6/28 Bcx obtained 6/29 No steroid/pressors given recently prior to leukocytosis rise  He has no cough, n/v/diarrhea, focal joint or back pain, dysuria, headache, myalgia  Ct chest angio with rib fx on admission; repeat cxr 6/28 clear chest Abd/pelv ct 6/29 sent  Only complaints of mild sob when he lays down  Covid screen negative on admission  Family History  Problem Relation Age of Onset   Cancer Mother    Cancer Father    CAD Neg Hx        No Known Allergies  Review of Systems: ROS All Other ROS was negative, except mentioned above   Past Medical History:  Diagnosis Date   COVID-19    received Mab 01/2020   Diabetes mellitus without complication (HCC)    Heart murmur    Hypertension    Mild aortic stenosis        Scheduled Meds:  amLODipine  5 mg Oral Daily   aspirin EC  81 mg Oral Daily   Chlorhexidine Gluconate Cloth  6 each Topical Daily   [START ON 09/23/2020] epinephrine  0-10 mcg/min Intravenous To OR   feeding supplement  237 mL Oral BID BM   [START ON 09/23/2020] heparin-papaverine-plasmalyte irrigation   Irrigation To OR   [START ON 09/23/2020] insulin   Intravenous To OR   lidocaine  1 patch Transdermal Q24H   magnesium oxide  400 mg Oral BID   [START ON 09/23/2020] magnesium sulfate  40 mEq Other To OR   mouth rinse  15 mL Mouth Rinse BID   pantoprazole  40 mg Oral  Daily   [START ON 09/23/2020] phenylephrine  30-200 mcg/min Intravenous To OR   [START ON 09/23/2020] potassium chloride  80 mEq Other To OR   rosuvastatin  20 mg Oral Daily   senna-docusate  1 tablet Oral Daily   sertraline  50 mg Oral Daily   sodium chloride flush  3 mL Intravenous Q12H   [START ON 09/23/2020] tranexamic acid  15 mg/kg Intravenous To OR   [START ON 09/23/2020] tranexamic acid  2 mg/kg Intracatheter To OR   vitamin B-12  1,000 mcg Oral Daily   Continuous Infusions:  sodium chloride 1,000 mL (09/21/20 1559)   sodium chloride     [START ON 09/23/2020]   ceFAZolin (ANCEF) IV     [START ON 09/23/2020]  ceFAZolin (ANCEF) IV     cefTRIAXone (ROCEPHIN)  IV 1 g (09/21/20 1601)   [START ON 09/23/2020] dexmedetomidine     [START ON 09/23/2020] heparin 30,000 units/NS 1000 mL solution for CELLSAVER     heparin Stopped (09/22/20 0818)   [START ON 09/23/2020] milrinone     [START ON 09/23/2020] nitroGLYCERIN     [START ON 09/23/2020] norepinephrine     [START ON 09/23/2020] tranexamic acid (CYKLOKAPRON) infusion ([facility])     [START ON 09/23/2020] vancomycin     PRN Meds:.sodium chloride, sodium chloride, acetaminophen, diphenhydrAMINE, docusate sodium, HYDROcodone-acetaminophen, labetalol, morphine injection, ondansetron (ZOFRAN) IV, polyethylene glycol, sodium chloride flush   OBJECTIVE: Blood pressure 113/70, pulse (!) 108, temperature 99.3 F (37.4 C), temperature source Oral, resp. rate 17, height 5\' 6"  (1.676 m), weight 90.2 kg, SpO2 98 %.  Physical Exam General/constitutional: no distress, pleasant HEENT: Normocephalic, PER, Conj Clear, EOMI, Oropharynx clear Neck supple CV: rrr no mrg Lungs: clear to auscultation, normal respiratory effort Abd: Soft, Nontender Ext: no edema Skin: No Rash Neuro: nonfocal MSK: no peripheral joint swelling/tenderness/warmth; back spines nontender Psych alert/oriented   Lab Results Lab Results  Component Value Date   WBC 22.1 (H) 09/22/2020   HGB 8.7 (L) 09/22/2020   HCT 25.1 (L) 09/22/2020   MCV 86.0 09/22/2020   PLT 250 09/22/2020    Lab Results  Component Value Date   CREATININE 1.19 09/22/2020   BUN 39 (H) 09/22/2020   NA 129 (L) 09/22/2020   K 3.5 09/22/2020   CL 94 (L) 09/22/2020   CO2 25 09/22/2020    Lab Results  Component Value Date   ALT 43 09/21/2020   AST 131 (H) 09/21/2020   ALKPHOS 50 09/21/2020   BILITOT 1.4 (H) 09/21/2020      Microbiology: Recent Results (from the past 240 hour(s))  Resp Panel by RT-PCR (Flu A&B, Covid) Nasopharyngeal Swab     Status: None   Collection  Time: 09/16/20  6:08 PM   Specimen: Nasopharyngeal Swab; Nasopharyngeal(NP) swabs in vial transport medium  Result Value Ref Range Status   SARS Coronavirus 2 by RT PCR NEGATIVE NEGATIVE Final    Comment: (NOTE) SARS-CoV-2 target nucleic acids are NOT DETECTED.  The SARS-CoV-2 RNA is generally detectable in upper respiratory specimens during the acute phase of infection. The lowest concentration of SARS-CoV-2 viral copies this assay can detect is 138 copies/mL. A negative result does not preclude SARS-Cov-2 infection and should not be used as the sole basis for treatment or other patient management decisions. A negative result may occur with  improper specimen collection/handling, submission of specimen other than nasopharyngeal swab, presence of viral mutation(s) within the areas targeted by this assay, and inadequate number of viral copies(<138 copies/mL).  A negative result must be combined with clinical observations, patient history, and epidemiological information. The expected result is Negative.  Fact Sheet for Patients:  EntrepreneurPulse.com.au  Fact Sheet for Healthcare Providers:  IncredibleEmployment.be  This test is no t yet approved or cleared by the [place] FDA and  has been authorized for detection and/or diagnosis of SARS-CoV-2 by FDA under an Emergency Use Authorization (EUA). This EUA will remain  in effect (meaning this test can be used) for the duration of the COVID-19 declaration under Section 564(b)(1) of the Act, 21 U.S.C.section 360bbb-3(b)(1), unless the authorization is terminated  or revoked sooner.       Influenza A by PCR NEGATIVE NEGATIVE Final   Influenza B by PCR NEGATIVE NEGATIVE Final    Comment: (NOTE) The Xpert Xpress SARS-CoV-2/FLU/RSV plus assay is intended as an aid in the diagnosis of influenza from Nasopharyngeal swab specimens and should not be used as a sole basis for treatment. Nasal washings  and aspirates are unacceptable for Xpert Xpress SARS-CoV-2/FLU/RSV testing.  Fact Sheet for Patients: EntrepreneurPulse.com.au  Fact Sheet for Healthcare Providers: IncredibleEmployment.be  This test is not yet approved or cleared by the [place] FDA and has been authorized for detection and/or diagnosis of SARS-CoV-2 by FDA under an Emergency Use Authorization (EUA). This EUA will remain in effect (meaning this test can be used) for the duration of the COVID-19 declaration under Section 564(b)(1) of the Act, 21 U.S.C. section 360bbb-3(b)(1), unless the authorization is terminated or revoked.  Performed at [facility], [place] [address] [place], [place] [address]   MRSA Next Gen by PCR, Nasal     Status: None   Collection Time: 09/16/20  9:32 PM   Specimen: Nasal Mucosa; Nasal Swab  Result Value Ref Range Status   MRSA by PCR Next Gen NOT DETECTED NOT DETECTED Final    Comment: (NOTE) The GeneXpert MRSA Assay (FDA approved for NASAL specimens only), is one component of a comprehensive MRSA colonization surveillance program. It is not intended to diagnose MRSA infection nor to guide or monitor treatment for MRSA infections. Test performance is not FDA approved in patients less than 84 years old. Performed at [facility], [place] [address], [place], [place] [address]   Urine Culture     Status: None (Preliminary result)   Collection Time: 09/21/20  2:17 PM   Specimen: Urine, Random  Result Value Ref Range Status   Specimen Description URINE, RANDOM  Final   Special Requests NONE  Final   Culture   Final    CULTURE REINCUBATED FOR BETTER GROWTH Performed at [facility], [place] [address] [place], [place] [address]    Report Status PENDING  Incomplete     Serology:    Imaging: If present, new imagings (plain films, ct scans, and mri) have been personally visualized and interpreted; radiology reports have been  reviewed. Decision making incorporated into the Impression / Recommendations.  6/29 abd/pelv ct in progress 1. Anterior rib fractures, likely CPR related. Small layering pleural effusions are new since 09/16/2020 but with simple fluid density favors transudate.   2. Hyperdense urine within the bladder raises the possibility of Gross Hematuria. Negative non-contrast kidneys, ureters.   3. No other evidence of hemorrhage in the abdomen or pelvis. No other acute or inflammatory process identified.   4. Aortic Atherosclerosis      Jabier Mutton, [place] [facility] [place] [phone] pager  09/22/2020, 11:22 AM

## 2020-09-22 NOTE — Progress Notes (Addendum)
Patient's AM hgb level is 7.5. Regalado, MD ordered CT abdomen, STAT CBC and UA. Heparin gtt is on hold. Cardiology made aware of heparin gtt hold. Patient states he feels fine and no pain. CBC has resulted to a value of 8.7. Cardiology recommends to wait for the UA to result to resume heparin gtt. Will continue to hold heparin gtt. Regalado, MD made aware.

## 2020-09-22 NOTE — Consult Note (Signed)
   [facility] Inpatient Consult   09/22/2020  Almir Botts 07/12/1945 [id number]  [facility] [place] [facility] [ACO] Patient: Medicare CMS DCE  Primary Care Provider:  [place] Carol, MD, [facility] is listed to provide the [facility] follow up.  Patient screened for hospitalization with noted high risk score for unplanned readmission risk and to assess for potential [place] Management service needs for post hospital transition.  Review of patient's medical record reveals patient is in continued cardiac work up with planned CABG for 09/23/20 noted.  Plan:  Continue to follow progress and disposition to assess for post hospital care management needs.    For questions contact:   Natividad Brood, RN BSN [facility] Liaison  [phone] business mobile phone Toll free office [phone]  Fax number: [phone] [email or web address]

## 2020-09-22 NOTE — Progress Notes (Signed)
Came to ambulate however pt needing to have BM. Able to stand with rocking following sternal precautions with verbal cues. Wife present. Ambulated to [facility] with standby assist. Pt then to BR to clean himself, squatting down and wiping from front. HR 116 ST with PACs. Pt washed hands to return to recliner as this activity would be more exertional than a walk. BP 125/73, SaO2 95 RA. Reviewed sternal precautions, IS, and answered wifes questions. Pt c/o SOB and feeling anxious with activity. [place] 11:44 AM 09/22/2020

## 2020-09-22 NOTE — Progress Notes (Signed)
2 Days Post-Op Procedure(s) (LRB): LEFT HEART CATH AND CORONARY ANGIOGRAPHY (N/A) Subjective: No complaints, seems a little more focused today  Objective: Vital signs in last 24 hours: Temp:  [99.3 F (37.4 C)-100.3 F (37.9 C)] 99.3 F (37.4 C) (06/29 0831) Pulse Rate:  [97-108] 102 (06/29 1232) Cardiac Rhythm: Sinus tachycardia (06/29 0708) Resp:  [16-17] 17 (06/29 0831) BP: (110-151)/(70-129) 114/73 (06/29 1232) SpO2:  [92 %-98 %] 92 % (06/29 1232) Weight:  [90.2 kg] 90.2 kg (06/29 0441)  Hemodynamic parameters for last 24 hours:    Intake/Output from previous day: 06/28 0701 - 06/29 0700 In: 1002.9 [P.O.:595; I.V.:307.9; IV Piggyback:100] Out: 620 [Urine:620] Intake/Output this shift: Total I/O In: 480 [P.O.:480] Out: 325 [Urine:325]  General appearance: alert, cooperative, and less distracted Neurologic: no focal motor deficit Heart: regular rate and rhythm Lungs: clear to auscultation bilaterally + Systolic murmur Lab Results: Recent Labs    09/22/20 0349 09/22/20 0944  WBC 18.6* 22.1*  HGB 7.5* 8.7*  HCT 22.2* 25.1*  PLT 209 250   BMET:  Recent Labs    09/21/20 0219 09/22/20 0349  NA 132* 129*  K 3.8 3.5  CL 94* 94*  CO2 26 25  GLUCOSE 138* 124*  BUN 29* 39*  CREATININE 1.09 1.19  CALCIUM 8.9 8.6*    PT/INR: No results for input(s): LABPROT, INR in the last 72 hours. ABG    Component Value Date/Time   PHART 7.402 09/17/2020 0336   HCO3 34.5 (H) 09/17/2020 0336   TCO2 36 (H) 09/17/2020 0336   O2SAT 100.0 09/17/2020 0336   CBG (last 3)  No results for input(s): GLUCAP in the last 72 hours.  Assessment/Plan: S/P Procedure(s) (LRB): LEFT HEART CATH AND CORONARY ANGIOGRAPHY (N/A) - For CABG in AM Discussed echo with Dr. Burt Knack. I reviewed the echo images as well as the report. Based on TTE the valve appears heavily calcified and by appearance could be moderately or severely stenotic.  However, there is only mild AS with mean gradient 14  mmHg and peak 25 mmHg and AoV area is 1.98 cm2.  If those numbers are accurate the valve would not need to be replaced at this time.  Discussed Dr. Burt Knack. Obviously we don't want to leave behind a valve that is significantly stenotic.  By the same token we don't want to add complexity and additional procedures in this clinical setting unless necessary.  I discussed this issue with Mr and Mrs Ream and their daughter (phone). Plan will be assess carefully with TEE intraop. If valve is significantly stenotic will replace with a tissue valve. If the valve area is about the same as TTE, then would forego AVR at this time and consider TAVR in the future.  He and his family understand if an AVR is necessary it will add time to the procedure and increase the risks as well.    LOS: 6 days    [name] 09/22/2020

## 2020-09-23 ENCOUNTER — Inpatient Hospital Stay (HOSPITAL_COMMUNITY): Payer: Medicare Other | Admitting: Registered Nurse

## 2020-09-23 ENCOUNTER — Inpatient Hospital Stay (HOSPITAL_COMMUNITY): Payer: Medicare Other

## 2020-09-23 ENCOUNTER — Inpatient Hospital Stay (HOSPITAL_COMMUNITY)
Admission: EM | Disposition: A | Discharge status: Home Health | Payer: Self-pay | Source: Home / Self Care | Attending: Thoracic Surgery (Cardiothoracic Vascular Surgery)

## 2020-09-23 DIAGNOSIS — I2510 Atherosclerotic heart disease of native coronary artery without angina pectoris: Secondary | ICD-10-CM

## 2020-09-23 HISTORY — PX: TEE WITHOUT CARDIOVERSION: SHX5443

## 2020-09-23 HISTORY — PX: ENDOVEIN HARVEST OF GREATER SAPHENOUS VEIN: SHX5059

## 2020-09-23 HISTORY — PX: CORONARY ARTERY BYPASS GRAFT: SHX141

## 2020-09-23 LAB — POCT I-STAT, CHEM 8
BUN: 34 mg/dL — ABNORMAL HIGH (ref 8–23)
BUN: 31 mg/dL — ABNORMAL HIGH (ref 8–23)
BUN: 35 mg/dL — ABNORMAL HIGH (ref 8–23)
BUN: 31 mg/dL — ABNORMAL HIGH (ref 8–23)
BUN: 31 mg/dL — ABNORMAL HIGH (ref 8–23)
BUN: 32 mg/dL — ABNORMAL HIGH (ref 8–23)
Calcium, Ion: 1.11 mmol/L — ABNORMAL LOW (ref 1.15–1.40)
Calcium, Ion: 0.94 mmol/L — ABNORMAL LOW (ref 1.15–1.40)
Calcium, Ion: 1.11 mmol/L — ABNORMAL LOW (ref 1.15–1.40)
Calcium, Ion: 0.98 mmol/L — ABNORMAL LOW (ref 1.15–1.40)
Calcium, Ion: 1.02 mmol/L — ABNORMAL LOW (ref 1.15–1.40)
Calcium, Ion: 1.06 mmol/L — ABNORMAL LOW (ref 1.15–1.40)
Chloride: 93 mmol/L — ABNORMAL LOW (ref 98–111)
Chloride: 92 mmol/L — ABNORMAL LOW (ref 98–111)
Chloride: 94 mmol/L — ABNORMAL LOW (ref 98–111)
Chloride: 94 mmol/L — ABNORMAL LOW (ref 98–111)
Chloride: 96 mmol/L — ABNORMAL LOW (ref 98–111)
Chloride: 96 mmol/L — ABNORMAL LOW (ref 98–111)
Creatinine, Ser: 1 mg/dL (ref 0.61–1.24)
Creatinine, Ser: 0.8 mg/dL (ref 0.61–1.24)
Creatinine, Ser: 0.9 mg/dL (ref 0.61–1.24)
Creatinine, Ser: 0.9 mg/dL (ref 0.61–1.24)
Creatinine, Ser: 0.8 mg/dL (ref 0.61–1.24)
Creatinine, Ser: 0.9 mg/dL (ref 0.61–1.24)
Glucose, Bld: 130 mg/dL — ABNORMAL HIGH (ref 70–99)
Glucose, Bld: 120 mg/dL — ABNORMAL HIGH (ref 70–99)
Glucose, Bld: 121 mg/dL — ABNORMAL HIGH (ref 70–99)
Glucose, Bld: 117 mg/dL — ABNORMAL HIGH (ref 70–99)
Glucose, Bld: 116 mg/dL — ABNORMAL HIGH (ref 70–99)
Glucose, Bld: 137 mg/dL — ABNORMAL HIGH (ref 70–99)
HCT: 25 % — ABNORMAL LOW (ref 39.0–52.0)
HCT: 19 % — ABNORMAL LOW (ref 39.0–52.0)
HCT: 20 % — ABNORMAL LOW (ref 39.0–52.0)
HCT: 20 % — ABNORMAL LOW (ref 39.0–52.0)
HCT: 20 % — ABNORMAL LOW (ref 39.0–52.0)
HCT: 33 % — ABNORMAL LOW (ref 39.0–52.0)
Hemoglobin: 8.5 g/dL — ABNORMAL LOW (ref 13.0–17.0)
Hemoglobin: 6.5 g/dL — CL (ref 13.0–17.0)
Hemoglobin: 6.8 g/dL — CL (ref 13.0–17.0)
Hemoglobin: 6.8 g/dL — CL (ref 13.0–17.0)
Hemoglobin: 11.2 g/dL — ABNORMAL LOW (ref 13.0–17.0)
Hemoglobin: 6.8 g/dL — CL (ref 13.0–17.0)
Potassium: 3.6 mmol/L (ref 3.5–5.1)
Potassium: 3.4 mmol/L — ABNORMAL LOW (ref 3.5–5.1)
Potassium: 3.7 mmol/L (ref 3.5–5.1)
Potassium: 4.4 mmol/L (ref 3.5–5.1)
Potassium: 4.3 mmol/L (ref 3.5–5.1)
Potassium: 4.9 mmol/L (ref 3.5–5.1)
Sodium: 131 mmol/L — ABNORMAL LOW (ref 135–145)
Sodium: 130 mmol/L — ABNORMAL LOW (ref 135–145)
Sodium: 133 mmol/L — ABNORMAL LOW (ref 135–145)
Sodium: 131 mmol/L — ABNORMAL LOW (ref 135–145)
Sodium: 131 mmol/L — ABNORMAL LOW (ref 135–145)
Sodium: 133 mmol/L — ABNORMAL LOW (ref 135–145)
TCO2: 27 mmol/L (ref 22–32)
TCO2: 25 mmol/L (ref 22–32)
TCO2: 28 mmol/L (ref 22–32)
TCO2: 29 mmol/L (ref 22–32)
TCO2: 28 mmol/L (ref 22–32)
TCO2: 30 mmol/L (ref 22–32)

## 2020-09-23 LAB — POCT I-STAT 7, (LYTES, BLD GAS, ICA,H+H)
Acid-Base Excess: 2 mmol/L (ref 0.0–2.0)
Acid-Base Excess: 6 mmol/L — ABNORMAL HIGH (ref 0.0–2.0)
Acid-Base Excess: 7 mmol/L — ABNORMAL HIGH (ref 0.0–2.0)
Acid-Base Excess: 4 mmol/L — ABNORMAL HIGH (ref 0.0–2.0)
Acid-Base Excess: 1 mmol/L (ref 0.0–2.0)
Acid-base deficit: 1 mmol/L (ref 0.0–2.0)
Acid-base deficit: 2 mmol/L (ref 0.0–2.0)
Bicarbonate: 26.6 mmol/L (ref 20.0–28.0)
Bicarbonate: 30.6 mmol/L — ABNORMAL HIGH (ref 20.0–28.0)
Bicarbonate: 30.8 mmol/L — ABNORMAL HIGH (ref 20.0–28.0)
Bicarbonate: 28.1 mmol/L — ABNORMAL HIGH (ref 20.0–28.0)
Bicarbonate: 26 mmol/L (ref 20.0–28.0)
Bicarbonate: 23.8 mmol/L (ref 20.0–28.0)
Bicarbonate: 23.1 mmol/L (ref 20.0–28.0)
Calcium, Ion: 1.13 mmol/L — ABNORMAL LOW (ref 1.15–1.40)
Calcium, Ion: 0.96 mmol/L — ABNORMAL LOW (ref 1.15–1.40)
Calcium, Ion: 0.99 mmol/L — ABNORMAL LOW (ref 1.15–1.40)
Calcium, Ion: 1.03 mmol/L — ABNORMAL LOW (ref 1.15–1.40)
Calcium, Ion: 1.05 mmol/L — ABNORMAL LOW (ref 1.15–1.40)
Calcium, Ion: 1.02 mmol/L — ABNORMAL LOW (ref 1.15–1.40)
Calcium, Ion: 1.06 mmol/L — ABNORMAL LOW (ref 1.15–1.40)
HCT: 23 % — ABNORMAL LOW (ref 39.0–52.0)
HCT: 20 % — ABNORMAL LOW (ref 39.0–52.0)
HCT: 21 % — ABNORMAL LOW (ref 39.0–52.0)
HCT: 20 % — ABNORMAL LOW (ref 39.0–52.0)
HCT: 24 % — ABNORMAL LOW (ref 39.0–52.0)
HCT: 18 % — ABNORMAL LOW (ref 39.0–52.0)
HCT: 19 % — ABNORMAL LOW (ref 39.0–52.0)
Hemoglobin: 7.8 g/dL — ABNORMAL LOW (ref 13.0–17.0)
Hemoglobin: 6.8 g/dL — CL (ref 13.0–17.0)
Hemoglobin: 7.1 g/dL — ABNORMAL LOW (ref 13.0–17.0)
Hemoglobin: 6.8 g/dL — CL (ref 13.0–17.0)
Hemoglobin: 8.2 g/dL — ABNORMAL LOW (ref 13.0–17.0)
Hemoglobin: 6.1 g/dL — CL (ref 13.0–17.0)
Hemoglobin: 6.5 g/dL — CL (ref 13.0–17.0)
O2 Saturation: 100 %
O2 Saturation: 100 %
O2 Saturation: 100 %
O2 Saturation: 100 %
O2 Saturation: 96 %
O2 Saturation: 99 %
O2 Saturation: 96 %
Patient temperature: 36
Patient temperature: 36.6
Patient temperature: 36.6
Potassium: 3.6 mmol/L (ref 3.5–5.1)
Potassium: 3.5 mmol/L (ref 3.5–5.1)
Potassium: 4.5 mmol/L (ref 3.5–5.1)
Potassium: 4.2 mmol/L (ref 3.5–5.1)
Potassium: 3.6 mmol/L (ref 3.5–5.1)
Potassium: 4 mmol/L (ref 3.5–5.1)
Potassium: 4 mmol/L (ref 3.5–5.1)
Sodium: 132 mmol/L — ABNORMAL LOW (ref 135–145)
Sodium: 131 mmol/L — ABNORMAL LOW (ref 135–145)
Sodium: 133 mmol/L — ABNORMAL LOW (ref 135–145)
Sodium: 133 mmol/L — ABNORMAL LOW (ref 135–145)
Sodium: 134 mmol/L — ABNORMAL LOW (ref 135–145)
Sodium: 135 mmol/L (ref 135–145)
Sodium: 134 mmol/L — ABNORMAL LOW (ref 135–145)
TCO2: 28 mmol/L (ref 22–32)
TCO2: 32 mmol/L (ref 22–32)
TCO2: 32 mmol/L (ref 22–32)
TCO2: 29 mmol/L (ref 22–32)
TCO2: 27 mmol/L (ref 22–32)
TCO2: 25 mmol/L (ref 22–32)
TCO2: 24 mmol/L (ref 22–32)
pCO2 arterial: 42.9 mmHg (ref 32.0–48.0)
pCO2 arterial: 41.6 mmHg (ref 32.0–48.0)
pCO2 arterial: 46.4 mmHg (ref 32.0–48.0)
pCO2 arterial: 41.9 mmHg (ref 32.0–48.0)
pCO2 arterial: 42.7 mmHg (ref 32.0–48.0)
pCO2 arterial: 35.3 mmHg (ref 32.0–48.0)
pCO2 arterial: 36.7 mmHg (ref 32.0–48.0)
pH, Arterial: 7.4 (ref 7.350–7.450)
pH, Arterial: 7.427 (ref 7.350–7.450)
pH, Arterial: 7.477 — ABNORMAL HIGH (ref 7.350–7.450)
pH, Arterial: 7.434 (ref 7.350–7.450)
pH, Arterial: 7.387 (ref 7.350–7.450)
pH, Arterial: 7.435 (ref 7.350–7.450)
pH, Arterial: 7.405 (ref 7.350–7.450)
pO2, Arterial: 267 mmHg — ABNORMAL HIGH (ref 83.0–108.0)
pO2, Arterial: 313 mmHg — ABNORMAL HIGH (ref 83.0–108.0)
pO2, Arterial: 378 mmHg — ABNORMAL HIGH (ref 83.0–108.0)
pO2, Arterial: 316 mmHg — ABNORMAL HIGH (ref 83.0–108.0)
pO2, Arterial: 80 mmHg — ABNORMAL LOW (ref 83.0–108.0)
pO2, Arterial: 137 mmHg — ABNORMAL HIGH (ref 83.0–108.0)
pO2, Arterial: 76 mmHg — ABNORMAL LOW (ref 83.0–108.0)

## 2020-09-23 LAB — BLOOD GAS, ARTERIAL
Acid-Base Excess: 1.8 mmol/L (ref 0.0–2.0)
Bicarbonate: 25.3 mmol/L (ref 20.0–28.0)
Drawn by: 24486
FIO2: 21
O2 Saturation: 89.4 %
Patient temperature: 37
pCO2 arterial: 35.6 mmHg (ref 32.0–48.0)
pH, Arterial: 7.465 — ABNORMAL HIGH (ref 7.350–7.450)
pO2, Arterial: 54.2 mmHg — ABNORMAL LOW (ref 83.0–108.0)

## 2020-09-23 LAB — GLUCOSE, CAPILLARY
Glucose-Capillary: 121 mg/dL — ABNORMAL HIGH (ref 70–99)
Glucose-Capillary: 119 mg/dL — ABNORMAL HIGH (ref 70–99)
Glucose-Capillary: 127 mg/dL — ABNORMAL HIGH (ref 70–99)
Glucose-Capillary: 136 mg/dL — ABNORMAL HIGH (ref 70–99)
Glucose-Capillary: 121 mg/dL — ABNORMAL HIGH (ref 70–99)
Glucose-Capillary: 120 mg/dL — ABNORMAL HIGH (ref 70–99)
Glucose-Capillary: 157 mg/dL — ABNORMAL HIGH (ref 70–99)
Glucose-Capillary: 116 mg/dL — ABNORMAL HIGH (ref 70–99)
Glucose-Capillary: 123 mg/dL — ABNORMAL HIGH (ref 70–99)

## 2020-09-23 LAB — CBC
HCT: 24.3 % — ABNORMAL LOW (ref 39.0–52.0)
HCT: 22.3 % — ABNORMAL LOW (ref 39.0–52.0)
HCT: 20.5 % — ABNORMAL LOW (ref 39.0–52.0)
Hemoglobin: 8.3 g/dL — ABNORMAL LOW (ref 13.0–17.0)
Hemoglobin: 7.4 g/dL — ABNORMAL LOW (ref 13.0–17.0)
Hemoglobin: 7.2 g/dL — ABNORMAL LOW (ref 13.0–17.0)
MCH: 29.5 pg (ref 26.0–34.0)
MCH: 29.2 pg (ref 26.0–34.0)
MCH: 30.9 pg (ref 26.0–34.0)
MCHC: 34.2 g/dL (ref 30.0–36.0)
MCHC: 33.2 g/dL (ref 30.0–36.0)
MCHC: 35.1 g/dL (ref 30.0–36.0)
MCV: 86.5 fL (ref 80.0–100.0)
MCV: 88.1 fL (ref 80.0–100.0)
MCV: 88 fL (ref 80.0–100.0)
Platelets: 254 10*3/uL (ref 150–400)
Platelets: 130 10*3/uL — ABNORMAL LOW (ref 150–400)
Platelets: 141 10*3/uL — ABNORMAL LOW (ref 150–400)
RBC: 2.81 MIL/uL — ABNORMAL LOW (ref 4.22–5.81)
RBC: 2.53 MIL/uL — ABNORMAL LOW (ref 4.22–5.81)
RBC: 2.33 MIL/uL — ABNORMAL LOW (ref 4.22–5.81)
RDW: 14.1 % (ref 11.5–15.5)
RDW: 13.5 % (ref 11.5–15.5)
RDW: 13.8 % (ref 11.5–15.5)
WBC: 17.8 10*3/uL — ABNORMAL HIGH (ref 4.0–10.5)
WBC: 21.2 10*3/uL — ABNORMAL HIGH (ref 4.0–10.5)
WBC: 16.8 10*3/uL — ABNORMAL HIGH (ref 4.0–10.5)
nRBC: 0 % (ref 0.0–0.2)
nRBC: 0.1 % (ref 0.0–0.2)
nRBC: 0.1 % (ref 0.0–0.2)

## 2020-09-23 LAB — PHOSPHORUS: Phosphorus: 3.1 mg/dL (ref 2.5–4.6)

## 2020-09-23 LAB — POCT I-STAT EG7
Acid-Base Excess: 2 mmol/L (ref 0.0–2.0)
Bicarbonate: 27.7 mmol/L (ref 20.0–28.0)
Calcium, Ion: 0.94 mmol/L — ABNORMAL LOW (ref 1.15–1.40)
HCT: 20 % — ABNORMAL LOW (ref 39.0–52.0)
Hemoglobin: 6.8 g/dL — CL (ref 13.0–17.0)
O2 Saturation: 62 %
Potassium: 4 mmol/L (ref 3.5–5.1)
Sodium: 134 mmol/L — ABNORMAL LOW (ref 135–145)
TCO2: 29 mmol/L (ref 22–32)
pCO2, Ven: 49.1 mmHg (ref 44.0–60.0)
pH, Ven: 7.359 (ref 7.250–7.430)
pO2, Ven: 34 mmHg (ref 32.0–45.0)

## 2020-09-23 LAB — BASIC METABOLIC PANEL
Anion gap: 12 (ref 5–15)
Anion gap: 10 (ref 5–15)
BUN: 39 mg/dL — ABNORMAL HIGH (ref 8–23)
BUN: 34 mg/dL — ABNORMAL HIGH (ref 8–23)
CO2: 25 mmol/L (ref 22–32)
CO2: 21 mmol/L — ABNORMAL LOW (ref 22–32)
Calcium: 8.8 mg/dL — ABNORMAL LOW (ref 8.9–10.3)
Calcium: 7 mg/dL — ABNORMAL LOW (ref 8.9–10.3)
Chloride: 92 mmol/L — ABNORMAL LOW (ref 98–111)
Chloride: 97 mmol/L — ABNORMAL LOW (ref 98–111)
Creatinine, Ser: 1.2 mg/dL (ref 0.61–1.24)
Creatinine, Ser: 0.98 mg/dL (ref 0.61–1.24)
GFR, Estimated: >60 mL/min (ref >60)
GFR, Estimated: >60 mL/min (ref >60)
Glucose, Bld: 132 mg/dL — ABNORMAL HIGH (ref 70–99)
Glucose, Bld: 119 mg/dL — ABNORMAL HIGH (ref 70–99)
Potassium: 3.6 mmol/L (ref 3.5–5.1)
Potassium: 3.8 mmol/L (ref 3.5–5.1)
Sodium: 129 mmol/L — ABNORMAL LOW (ref 135–145)
Sodium: 128 mmol/L — ABNORMAL LOW (ref 135–145)

## 2020-09-23 LAB — URINE CULTURE: Culture: >=100000 — AB

## 2020-09-23 LAB — MAGNESIUM
Magnesium: 2.1 mg/dL (ref 1.7–2.4)
Magnesium: 3.1 mg/dL — ABNORMAL HIGH (ref 1.7–2.4)

## 2020-09-23 LAB — HEMOGLOBIN AND HEMATOCRIT, BLOOD
HCT: 18.3 % — ABNORMAL LOW (ref 39.0–52.0)
Hemoglobin: 6.3 g/dL — CL (ref 13.0–17.0)

## 2020-09-23 LAB — PLATELET COUNT: Platelets: 142 10*3/uL — ABNORMAL LOW (ref 150–400)

## 2020-09-23 LAB — PROTIME-INR
INR: 1.8 — ABNORMAL HIGH (ref 0.8–1.2)
Prothrombin Time: 20.7 seconds — ABNORMAL HIGH (ref 11.4–15.2)

## 2020-09-23 LAB — APTT: aPTT: 36 seconds (ref 24–36)

## 2020-09-23 LAB — PREPARE RBC (CROSSMATCH)

## 2020-09-23 SURGERY — CORONARY ARTERY BYPASS GRAFTING (CABG)
Anesthesia: General | Site: Leg Upper

## 2020-09-23 MED ORDER — MIDAZOLAM HCL 5 MG/5ML IJ SOLN
INTRAMUSCULAR | Status: DC | PRN
  Administered 2020-09-23: 2 mg via INTRAVENOUS

## 2020-09-23 MED ORDER — PHENYLEPHRINE HCL-NACL 20-0.9 MG/250ML-% IV SOLN
INTRAVENOUS | Status: DC | PRN
  Administered 2020-09-23: 25 ug/min via INTRAVENOUS

## 2020-09-23 MED ORDER — POTASSIUM CHLORIDE 10 MEQ/50ML IV SOLN
10.0000 meq | INTRAVENOUS | Status: AC
  Administered 2020-09-23 (×3): 10 meq via INTRAVENOUS

## 2020-09-23 MED ORDER — METOPROLOL TARTRATE 12.5 MG HALF TABLET
12.5000 mg | ORAL_TABLET | Freq: Two times a day (BID) | ORAL | Status: DC
  Administered 2020-09-24 – 2020-09-27 (×8): 12.5 mg via ORAL
  Filled 2020-09-23 (×8): qty 1

## 2020-09-23 MED ORDER — PROTAMINE SULFATE 10 MG/ML IV SOLN
INTRAVENOUS | Status: DC | PRN
  Administered 2020-09-23: 250 mg via INTRAVENOUS

## 2020-09-23 MED ORDER — ASPIRIN 81 MG PO CHEW
324.0000 mg | CHEWABLE_TABLET | Freq: Every day | ORAL | Status: DC
  Filled 2020-09-23: qty 4

## 2020-09-23 MED ORDER — SODIUM CHLORIDE 0.9 % IV SOLN: 250.0000 mL | INTRAVENOUS | Status: DC

## 2020-09-23 MED ORDER — MIDAZOLAM HCL 2 MG/2ML IJ SOLN: 2.0000 mg | INTRAMUSCULAR | Status: DC | PRN

## 2020-09-23 MED ORDER — MAGNESIUM SULFATE 4 GM/100ML IV SOLN
4.0000 g | Freq: Once | INTRAVENOUS | Status: AC
  Administered 2020-09-23: 4 g via INTRAVENOUS
  Filled 2020-09-23: qty 100

## 2020-09-23 MED ORDER — HEPARIN SODIUM (PORCINE) 1000 UNIT/ML IJ SOLN
INTRAMUSCULAR | Status: AC
  Filled 2020-09-23: qty 1

## 2020-09-23 MED ORDER — LACTATED RINGERS IV SOLN: INTRAVENOUS | Status: DC | PRN

## 2020-09-23 MED ORDER — SODIUM CHLORIDE 0.45 % IV SOLN: INTRAVENOUS | Status: DC | PRN

## 2020-09-23 MED ORDER — MILRINONE LACTATE IN DEXTROSE 20-5 MG/100ML-% IV SOLN
INTRAVENOUS | Status: DC | PRN
  Administered 2020-09-23: .25 ug/kg/min via INTRAVENOUS

## 2020-09-23 MED ORDER — FENTANYL CITRATE (PF) 250 MCG/5ML IJ SOLN
INTRAMUSCULAR | Status: AC
  Filled 2020-09-23: qty 25

## 2020-09-23 MED ORDER — EPHEDRINE 5 MG/ML INJ
INTRAVENOUS | Status: AC
  Filled 2020-09-23: qty 10

## 2020-09-23 MED ORDER — SODIUM CHLORIDE (PF) 0.9 % IJ SOLN
OROMUCOSAL | Status: DC | PRN
  Administered 2020-09-23: 12 mL via TOPICAL

## 2020-09-23 MED ORDER — MILRINONE LACTATE IN DEXTROSE 20-5 MG/100ML-% IV SOLN
0.1250 ug/kg/min | INTRAVENOUS | Status: DC
  Administered 2020-09-23: 0.25 ug/kg/min via INTRAVENOUS
  Administered 2020-09-25 – 2020-09-26 (×2): 0.125 ug/kg/min via INTRAVENOUS
  Filled 2020-09-23 (×3): qty 100

## 2020-09-23 MED ORDER — LIDOCAINE 2% (20 MG/ML) 5 ML SYRINGE
INTRAMUSCULAR | Status: AC
  Filled 2020-09-23: qty 5

## 2020-09-23 MED ORDER — INSULIN REGULAR(HUMAN) IN NACL 100-0.9 UT/100ML-% IV SOLN
INTRAVENOUS | Status: DC | PRN
  Administered 2020-09-23: 1 [IU]/h via INTRAVENOUS

## 2020-09-23 MED ORDER — PLASMA-LYTE A IV SOLN
INTRAVENOUS | Status: DC | PRN
  Administered 2020-09-23: 1000 mL

## 2020-09-23 MED ORDER — CHLORHEXIDINE GLUCONATE 0.12 % MT SOLN
15.0000 mL | OROMUCOSAL | Status: AC
  Administered 2020-09-23: 15 mL via OROMUCOSAL

## 2020-09-23 MED ORDER — PROTAMINE SULFATE 10 MG/ML IV SOLN
INTRAVENOUS | Status: AC
  Filled 2020-09-23: qty 25

## 2020-09-23 MED ORDER — BISACODYL 10 MG RE SUPP: 10.0000 mg | Freq: Every day | RECTAL | Status: DC

## 2020-09-23 MED ORDER — PROPOFOL 10 MG/ML IV BOLUS
INTRAVENOUS | Status: AC
  Filled 2020-09-23: qty 20

## 2020-09-23 MED ORDER — HEMOSTATIC AGENTS (NO CHARGE) OPTIME: TOPICAL | Status: DC | PRN

## 2020-09-23 MED ORDER — DEXTROSE 50 % IV SOLN: 0.0000 mL | INTRAVENOUS | Status: DC | PRN

## 2020-09-23 MED ORDER — ASPIRIN EC 325 MG PO TBEC
325.0000 mg | DELAYED_RELEASE_TABLET | Freq: Every day | ORAL | Status: DC
  Administered 2020-09-24 – 2020-09-28 (×5): 325 mg via ORAL
  Filled 2020-09-23 (×5): qty 1

## 2020-09-23 MED ORDER — 0.9 % SODIUM CHLORIDE (POUR BTL) OPTIME
TOPICAL | Status: DC | PRN
  Administered 2020-09-23: 5000 mL

## 2020-09-23 MED ORDER — HEMOSTATIC AGENTS (NO CHARGE) OPTIME
TOPICAL | Status: DC | PRN
  Administered 2020-09-23: 1 via TOPICAL

## 2020-09-23 MED ORDER — ACETAMINOPHEN 160 MG/5ML PO SOLN: 1000.0000 mg | Freq: Four times a day (QID) | ORAL | Status: AC

## 2020-09-23 MED ORDER — ROCURONIUM BROMIDE 10 MG/ML (PF) SYRINGE
PREFILLED_SYRINGE | INTRAVENOUS | Status: AC
  Filled 2020-09-23: qty 10

## 2020-09-23 MED ORDER — HEPARIN SODIUM (PORCINE) 1000 UNIT/ML IJ SOLN
INTRAMUSCULAR | Status: DC | PRN
  Administered 2020-09-23: 28000 [IU] via INTRAVENOUS
  Administered 2020-09-23: 2000 [IU] via INTRAVENOUS

## 2020-09-23 MED ORDER — ALBUMIN HUMAN 5 % IV SOLN
250.0000 mL | INTRAVENOUS | Status: AC | PRN
  Administered 2020-09-23 – 2020-09-24 (×3): 12.5 g via INTRAVENOUS
  Filled 2020-09-23 (×2): qty 250

## 2020-09-23 MED ORDER — CHLORHEXIDINE GLUCONATE CLOTH 2 % EX PADS
6.0000 | MEDICATED_PAD | Freq: Every day | CUTANEOUS | Status: DC
  Administered 2020-09-23 – 2020-09-26 (×4): 6 via TOPICAL

## 2020-09-23 MED ORDER — PHENYLEPHRINE HCL-NACL 20-0.9 MG/250ML-% IV SOLN
0.0000 ug/min | INTRAVENOUS | Status: DC
  Administered 2020-09-24: 30 ug/min via INTRAVENOUS
  Filled 2020-09-23: qty 250

## 2020-09-23 MED ORDER — ONDANSETRON HCL 4 MG/2ML IJ SOLN: 4.0000 mg | Freq: Four times a day (QID) | INTRAMUSCULAR | Status: DC | PRN

## 2020-09-23 MED ORDER — SODIUM CHLORIDE 0.9% FLUSH
3.0000 mL | Freq: Two times a day (BID) | INTRAVENOUS | Status: DC
  Administered 2020-09-24 – 2020-09-26 (×6): 3 mL via INTRAVENOUS

## 2020-09-23 MED ORDER — NITROGLYCERIN IN D5W 200-5 MCG/ML-% IV SOLN: 0.0000 ug/min | INTRAVENOUS | Status: DC

## 2020-09-23 MED ORDER — LACTATED RINGERS IV SOLN: INTRAVENOUS | Status: DC

## 2020-09-23 MED ORDER — ACETAMINOPHEN 500 MG PO TABS
1000.0000 mg | ORAL_TABLET | Freq: Four times a day (QID) | ORAL | Status: AC
  Administered 2020-09-24 – 2020-09-28 (×18): 1000 mg via ORAL
  Filled 2020-09-23 (×18): qty 2

## 2020-09-23 MED ORDER — DEXMEDETOMIDINE HCL IN NACL 400 MCG/100ML IV SOLN
INTRAVENOUS | Status: DC | PRN
  Administered 2020-09-23: .4 ug/kg/h via INTRAVENOUS

## 2020-09-23 MED ORDER — METOPROLOL TARTRATE 25 MG/10 ML ORAL SUSPENSION
12.5000 mg | Freq: Two times a day (BID) | ORAL | Status: DC
  Filled 2020-09-23 (×4): qty 5

## 2020-09-23 MED ORDER — ACETAMINOPHEN 160 MG/5ML PO SOLN
650.0000 mg | Freq: Once | ORAL | Status: AC
Start: 2020-09-23 — End: 2020-09-23

## 2020-09-23 MED ORDER — SODIUM CHLORIDE 0.9 % IV SOLN: INTRAVENOUS | Status: DC

## 2020-09-23 MED ORDER — SODIUM CHLORIDE (PF) 0.9 % IJ SOLN
INTRAMUSCULAR | Status: AC
  Filled 2020-09-23: qty 10

## 2020-09-23 MED ORDER — DOCUSATE SODIUM 100 MG PO CAPS
200.0000 mg | ORAL_CAPSULE | Freq: Every day | ORAL | Status: DC
  Administered 2020-09-24 – 2020-09-28 (×5): 200 mg via ORAL
  Filled 2020-09-23 (×5): qty 2

## 2020-09-23 MED ORDER — OXYCODONE HCL 5 MG PO TABS
5.0000 mg | ORAL_TABLET | ORAL | Status: DC | PRN
  Administered 2020-09-24: 10 mg via ORAL
  Administered 2020-09-24: 5 mg via ORAL
  Administered 2020-09-24: 10 mg via ORAL
  Administered 2020-09-24: 5 mg via ORAL
  Administered 2020-09-26 – 2020-09-27 (×2): 10 mg via ORAL
  Filled 2020-09-23 (×2): qty 1
  Filled 2020-09-23: qty 2
  Filled 2020-09-23: qty 1
  Filled 2020-09-23 (×3): qty 2

## 2020-09-23 MED ORDER — ACETAMINOPHEN 650 MG RE SUPP
650.0000 mg | Freq: Once | RECTAL | Status: AC
  Administered 2020-09-23: 650 mg via RECTAL

## 2020-09-23 MED ORDER — LACTATED RINGERS IV SOLN: 500.0000 mL | Freq: Once | INTRAVENOUS | Status: DC | PRN

## 2020-09-23 MED ORDER — LIDOCAINE 2% (20 MG/ML) 5 ML SYRINGE
INTRAMUSCULAR | Status: DC | PRN
  Administered 2020-09-23: 80 mg via INTRAVENOUS

## 2020-09-23 MED ORDER — MORPHINE SULFATE (PF) 2 MG/ML IV SOLN
1.0000 mg | INTRAVENOUS | Status: DC | PRN
  Administered 2020-09-23 – 2020-09-24 (×3): 2 mg via INTRAVENOUS
  Filled 2020-09-23 (×3): qty 1

## 2020-09-23 MED ORDER — EPINEPHRINE HCL 5 MG/250ML IV SOLN IN NS
0.0000 ug/min | INTRAVENOUS | Status: DC
Start: 2020-09-23 — End: 2020-09-25

## 2020-09-23 MED ORDER — PROPOFOL 10 MG/ML IV BOLUS
INTRAVENOUS | Status: DC | PRN
  Administered 2020-09-23: 40 mg via INTRAVENOUS

## 2020-09-23 MED ORDER — FAMOTIDINE IN NACL 20-0.9 MG/50ML-% IV SOLN
20.0000 mg | Freq: Two times a day (BID) | INTRAVENOUS | Status: AC
  Administered 2020-09-23: 20 mg via INTRAVENOUS
  Filled 2020-09-23: qty 50

## 2020-09-23 MED ORDER — TRANEXAMIC ACID 1000 MG/10ML IV SOLN
INTRAVENOUS | Status: DC | PRN
  Administered 2020-09-23: 1.5 mg/kg/h via INTRAVENOUS

## 2020-09-23 MED ORDER — MIDAZOLAM HCL (PF) 10 MG/2ML IJ SOLN
INTRAMUSCULAR | Status: AC
  Filled 2020-09-23: qty 2

## 2020-09-23 MED ORDER — SODIUM CHLORIDE 0.9% FLUSH: 3.0000 mL | INTRAVENOUS | Status: DC | PRN

## 2020-09-23 MED ORDER — CEFAZOLIN SODIUM-DEXTROSE 2-4 GM/100ML-% IV SOLN
2.0000 g | Freq: Three times a day (TID) | INTRAVENOUS | Status: DC
  Filled 2020-09-23: qty 100

## 2020-09-23 MED ORDER — FENTANYL CITRATE (PF) 100 MCG/2ML IJ SOLN
INTRAMUSCULAR | Status: DC | PRN
  Administered 2020-09-23: 225 ug via INTRAVENOUS
  Administered 2020-09-23: 150 ug via INTRAVENOUS
  Administered 2020-09-23: 25 ug via INTRAVENOUS
  Administered 2020-09-23: 100 ug via INTRAVENOUS

## 2020-09-23 MED ORDER — ROCURONIUM BROMIDE 10 MG/ML (PF) SYRINGE
PREFILLED_SYRINGE | INTRAVENOUS | Status: DC | PRN
  Administered 2020-09-23: 70 mg via INTRAVENOUS
  Administered 2020-09-23: 50 mg via INTRAVENOUS
  Administered 2020-09-23: 30 mg via INTRAVENOUS

## 2020-09-23 MED ORDER — BISACODYL 5 MG PO TBEC
10.0000 mg | DELAYED_RELEASE_TABLET | Freq: Every day | ORAL | Status: DC
  Administered 2020-09-24 – 2020-09-28 (×5): 10 mg via ORAL
  Filled 2020-09-23 (×5): qty 2

## 2020-09-23 MED ORDER — ARTIFICIAL TEARS OPHTHALMIC OINT
TOPICAL_OINTMENT | OPHTHALMIC | Status: AC
  Filled 2020-09-23: qty 3.5

## 2020-09-23 MED ORDER — TRAMADOL HCL 50 MG PO TABS
50.0000 mg | ORAL_TABLET | ORAL | Status: DC | PRN
  Administered 2020-09-24: 50 mg via ORAL
  Administered 2020-09-24 – 2020-09-26 (×6): 100 mg via ORAL
  Filled 2020-09-23 (×3): qty 2
  Filled 2020-09-23: qty 1
  Filled 2020-09-23 (×2): qty 2

## 2020-09-23 MED ORDER — EPHEDRINE SULFATE-NACL 50-0.9 MG/10ML-% IV SOSY
PREFILLED_SYRINGE | INTRAVENOUS | Status: DC | PRN
  Administered 2020-09-23: 5 mg via INTRAVENOUS

## 2020-09-23 MED ORDER — DEXMEDETOMIDINE HCL IN NACL 400 MCG/100ML IV SOLN
0.0000 ug/kg/h | INTRAVENOUS | Status: DC
  Administered 2020-09-23: 0.3 ug/kg/h via INTRAVENOUS
  Filled 2020-09-23: qty 100

## 2020-09-23 MED ORDER — VANCOMYCIN HCL IN DEXTROSE 1-5 GM/200ML-% IV SOLN
1000.0000 mg | Freq: Once | INTRAVENOUS | Status: AC
  Administered 2020-09-23: 1000 mg via INTRAVENOUS
  Filled 2020-09-23: qty 200

## 2020-09-23 MED ORDER — ALBUMIN HUMAN 5 % IV SOLN: INTRAVENOUS | Status: DC | PRN

## 2020-09-23 MED ORDER — PANTOPRAZOLE SODIUM 40 MG PO TBEC
40.0000 mg | DELAYED_RELEASE_TABLET | Freq: Every day | ORAL | Status: DC
  Administered 2020-09-25 – 2020-09-28 (×4): 40 mg via ORAL
  Filled 2020-09-23 (×4): qty 1

## 2020-09-23 MED ORDER — METOPROLOL TARTRATE 5 MG/5ML IV SOLN: 2.5000 mg | INTRAVENOUS | Status: DC | PRN

## 2020-09-23 MED ORDER — PHENYLEPHRINE 40 MCG/ML (10ML) SYRINGE FOR IV PUSH (FOR BLOOD PRESSURE SUPPORT)
PREFILLED_SYRINGE | INTRAVENOUS | Status: AC
  Filled 2020-09-23: qty 10

## 2020-09-23 MED ORDER — INSULIN REGULAR(HUMAN) IN NACL 100-0.9 UT/100ML-% IV SOLN: INTRAVENOUS | Status: DC

## 2020-09-23 SURGICAL SUPPLY — 98 items
ADAPTER CARDIO PERF ANTE/RETRO (ADAPTER) ×4 IMPLANT
BAG DECANTER FOR FLEXI CONT (MISCELLANEOUS) ×4 IMPLANT
BLADE CLIPPER SURG (BLADE) ×4 IMPLANT
BLADE STERNUM SYSTEM 6 (BLADE) ×4 IMPLANT
BLADE SURG 11 STRL SS (BLADE) ×1 IMPLANT
BLADE SURG 15 STRL LF DISP TIS (BLADE) ×3 IMPLANT
BLADE SURG 15 STRL SS (BLADE) ×1
BNDG ELASTIC 4X5.8 VLCR STR LF (GAUZE/BANDAGES/DRESSINGS) ×4 IMPLANT
BNDG ELASTIC 6X5.8 VLCR STR LF (GAUZE/BANDAGES/DRESSINGS) ×4 IMPLANT
BNDG GAUZE ELAST 4 BULKY (GAUZE/BANDAGES/DRESSINGS) ×4 IMPLANT
CANISTER SUCT 3000ML PPV (MISCELLANEOUS) ×4 IMPLANT
CANNULA EZ GLIDE AORTIC 21FR (CANNULA) ×4 IMPLANT
CANNULA GUNDRY RCSP 15FR (MISCELLANEOUS) ×4 IMPLANT
CATH CPB KIT HENDRICKSON (MISCELLANEOUS) ×4 IMPLANT
CATH HEART VENT LEFT (CATHETERS) ×3 IMPLANT
CATH ROBINSON RED A/P 18FR (CATHETERS) ×8 IMPLANT
CATH THORACIC 36FR (CATHETERS) ×4 IMPLANT
CATH THORACIC 36FR RT ANG (CATHETERS) ×8 IMPLANT
CLIP FOGARTY SPRING 6M (CLIP) IMPLANT
CLIP VESOCCLUDE MED 24/CT (CLIP) IMPLANT
CLIP VESOCCLUDE SM WIDE 24/CT (CLIP) ×2 IMPLANT
CONTAINER PROTECT SURGISLUSH (MISCELLANEOUS) ×5 IMPLANT
DEFOGGER ANTIFOG KIT (MISCELLANEOUS) ×1 IMPLANT
DERMABOND ADVANCED (GAUZE/BANDAGES/DRESSINGS) ×1
DERMABOND ADVANCED .7 DNX12 (GAUZE/BANDAGES/DRESSINGS) IMPLANT
DRAPE CARDIOVASCULAR INCISE (DRAPES) ×1
DRAPE SRG 135X102X78XABS (DRAPES) ×3 IMPLANT
DRAPE WARM FLUID 44X44 (DRAPES) ×4 IMPLANT
DRSG COVADERM 4X14 (GAUZE/BANDAGES/DRESSINGS) ×4 IMPLANT
ELECT REM PT RETURN 9FT ADLT (ELECTROSURGICAL) ×8
ELECTRODE REM PT RTRN 9FT ADLT (ELECTROSURGICAL) ×6 IMPLANT
FELT TEFLON 1X6 (MISCELLANEOUS) ×7 IMPLANT
GAUZE SPONGE 4X4 12PLY STRL (GAUZE/BANDAGES/DRESSINGS) ×8 IMPLANT
GAUZE SPONGE 4X4 12PLY STRL LF (GAUZE/BANDAGES/DRESSINGS) ×2 IMPLANT
GLOVE SURG MICRO LTX SZ6 (GLOVE) ×3 IMPLANT
GLOVE SURG SIGNA 7.5 PF LTX (GLOVE) ×12 IMPLANT
GOWN STRL REUS W/ TWL LRG LVL3 (GOWN DISPOSABLE) ×12 IMPLANT
GOWN STRL REUS W/ TWL XL LVL3 (GOWN DISPOSABLE) ×6 IMPLANT
GOWN STRL REUS W/TWL LRG LVL3 (GOWN DISPOSABLE) ×4
GOWN STRL REUS W/TWL XL LVL3 (GOWN DISPOSABLE) ×2
HEMOSTAT POWDER SURGIFOAM 1G (HEMOSTASIS) ×12 IMPLANT
HEMOSTAT SURGICEL 2X14 (HEMOSTASIS) ×4 IMPLANT
KIT BASIN OR (CUSTOM PROCEDURE TRAY) ×4 IMPLANT
KIT SUCTION CATH 14FR (SUCTIONS) ×8 IMPLANT
KIT TURNOVER KIT B (KITS) ×4 IMPLANT
KIT VASOVIEW HEMOPRO 2 VH 4000 (KITS) ×4 IMPLANT
LINE VENT (MISCELLANEOUS) ×1 IMPLANT
MARKER GRAFT CORONARY BYPASS (MISCELLANEOUS) ×12 IMPLANT
NS IRRIG 1000ML POUR BTL (IV SOLUTION) ×20 IMPLANT
PACK E OPEN HEART (SUTURE) ×4 IMPLANT
PACK OPEN HEART (CUSTOM PROCEDURE TRAY) ×4 IMPLANT
PAD ARMBOARD 7.5X6 YLW CONV (MISCELLANEOUS) ×8 IMPLANT
PAD ELECT DEFIB RADIOL ZOLL (MISCELLANEOUS) ×4 IMPLANT
PENCIL BUTTON HOLSTER BLD 10FT (ELECTRODE) ×4 IMPLANT
POSITIONER HEAD DONUT 9IN (MISCELLANEOUS) ×4 IMPLANT
PUNCH AORTIC ROTATE 4.0MM (MISCELLANEOUS) IMPLANT
PUNCH AORTIC ROTATE 4.5MM 8IN (MISCELLANEOUS) ×1 IMPLANT
PUNCH AORTIC ROTATE 5MM 8IN (MISCELLANEOUS) IMPLANT
SET MPS 3-ND DEL (MISCELLANEOUS) ×1 IMPLANT
SPONGE T-LAP 4X18 ~~LOC~~+RFID (SPONGE) ×1 IMPLANT
SUPPORT HEART JANKE-BARRON (MISCELLANEOUS) ×4 IMPLANT
SUT BONE WAX W31G (SUTURE) ×4 IMPLANT
SUT ETHIBON EXCEL 2-0 V-5 (SUTURE) IMPLANT
SUT ETHIBOND 2 0 SH (SUTURE) ×1
SUT ETHIBOND 2 0 SH 36X2 (SUTURE) ×3 IMPLANT
SUT ETHIBOND 2 0V4 GREEN (SUTURE) IMPLANT
SUT ETHIBOND 4 0 RB 1 (SUTURE) IMPLANT
SUT MNCRL AB 4-0 PS2 18 (SUTURE) ×1 IMPLANT
SUT PROLENE 3 0 SH 1 (SUTURE) IMPLANT
SUT PROLENE 3 0 SH DA (SUTURE) ×4 IMPLANT
SUT PROLENE 4 0 RB 1 (SUTURE) ×2
SUT PROLENE 4 0 SH DA (SUTURE) IMPLANT
SUT PROLENE 4-0 RB1 .5 CRCL 36 (SUTURE) ×6 IMPLANT
SUT PROLENE 6 0 C 1 30 (SUTURE) ×8 IMPLANT
SUT PROLENE 7 0 BV1 MDA (SUTURE) ×5 IMPLANT
SUT PROLENE 8 0 BV175 6 (SUTURE) ×1 IMPLANT
SUT SILK  1 MH (SUTURE) ×1
SUT SILK 1 MH (SUTURE) ×3 IMPLANT
SUT STEEL 6MS V (SUTURE) ×4 IMPLANT
SUT STEEL STERNAL CCS#1 18IN (SUTURE) IMPLANT
SUT STEEL SZ 6 DBL 3X14 BALL (SUTURE) ×4 IMPLANT
SUT VIC AB 1 CTX 36 (SUTURE) ×2
SUT VIC AB 1 CTX36XBRD ANBCTR (SUTURE) ×6 IMPLANT
SUT VIC AB 2-0 CT1 27 (SUTURE) ×1
SUT VIC AB 2-0 CT1 TAPERPNT 27 (SUTURE) IMPLANT
SUT VIC AB 3-0 SH 27 (SUTURE)
SUT VIC AB 3-0 SH 27X BRD (SUTURE) IMPLANT
SUT VIC AB 3-0 X1 27 (SUTURE) IMPLANT
SYSTEM SAHARA CHEST DRAIN ATS (WOUND CARE) ×4 IMPLANT
TAPE CLOTH SURG 4X10 WHT LF (GAUZE/BANDAGES/DRESSINGS) ×1 IMPLANT
TAPE PAPER 2X10 WHT MICROPORE (GAUZE/BANDAGES/DRESSINGS) ×1 IMPLANT
TOWEL GREEN STERILE (TOWEL DISPOSABLE) ×4 IMPLANT
TOWEL GREEN STERILE FF (TOWEL DISPOSABLE) ×4 IMPLANT
TRAY FOLEY SLVR 16FR TEMP STAT (SET/KITS/TRAYS/PACK) ×4 IMPLANT
TUBING LAP HI FLOW INSUFFLATIO (TUBING) ×4 IMPLANT
UNDERPAD 30X36 HEAVY ABSORB (UNDERPADS AND DIAPERS) ×4 IMPLANT
VENT LEFT HEART 12002 (CATHETERS) ×4
WATER STERILE IRR 1000ML POUR (IV SOLUTION) ×8 IMPLANT

## 2020-09-23 NOTE — Anesthesia Procedure Notes (Signed)
Procedure Name: Intubation Date/Time: 09/23/2020 8:59 AM Performed by: Kathryne Hitch, CRNA Pre-anesthesia Checklist: Patient identified, Emergency Drugs available, Suction available and Patient being monitored Patient Re-evaluated:Patient Re-evaluated prior to induction Oxygen Delivery Method: Circle system utilized Preoxygenation: Pre-oxygenation with 100% oxygen Induction Type: IV induction Ventilation: Mask ventilation without difficulty Laryngoscope Size: Miller and 3 Grade View: Grade II Tube type: Oral Tube size: 8.0 mm Number of attempts: 1 Airway Equipment and Method: Stylet and Oral airway Placement Confirmation: ETT inserted through vocal cords under direct vision, positive ETCO2 and breath sounds checked- equal and bilateral Secured at: 24 cm Tube secured with: Tape Dental Injury: Teeth and Oropharynx as per pre-operative assessment

## 2020-09-23 NOTE — Progress Notes (Signed)
Assisted tele visit to patient with wife.  Brian Robinson Kameko Hukill, RN   

## 2020-09-23 NOTE — Progress Notes (Signed)
Attempted vent wean x2 with RT per protocol.  Patient didn't tolorate and placed back on full support.  Will rest him and try again in an hour.

## 2020-09-23 NOTE — Progress Notes (Signed)
Chart check  Patient in OR  Clarification ID discussed with Dr Jerald Kief regarding enterococcus in the urine not pathogenic as no clinical syndrome uti  I was under the impresssion patient have vanc/ceftriaxone already started, so left as is while waiting for the culture... however the vanc was only ordered for the surgical prophylaxis  But again I have no suspicion this is a bacterial infectious syndrome. No plan to treat the enterococcus  If blood culture remains negative today can stop antibiotics

## 2020-09-23 NOTE — Anesthesia Procedure Notes (Signed)
Arterial Line Insertion Start/End6/30/2022 8:59 AM, 09/23/2020 8:59 AM Performed by: Betha Loa, CRNA, CRNA  Patient location: Pre-op. Preanesthetic checklist: patient identified, IV checked, site marked, risks and benefits discussed, surgical consent, monitors and equipment checked, pre-op evaluation, timeout performed and anesthesia consent Lidocaine 1% used for infiltration Left, radial was placed Catheter size: 20 Fr Hand hygiene performed  and maximum sterile barriers used   Attempts: 2 Procedure performed without using ultrasound guided technique. Following insertion, dressing applied and Biopatch. Post procedure assessment: normal and unchanged  Patient tolerated the procedure well with no immediate complications.

## 2020-09-23 NOTE — Op Note (Signed)
NAMEROCHELLE, NEPHEW MEDICAL RECORD NO: [id number] ACCOUNT NO: [id number] DATE OF BIRTH: Jan 20, 1946 FACILITY: [facility] LOCATION: [facility] PHYSICIAN: Revonda Standard. Roxan Hockey, MD  Operative Report   DATE OF PROCEDURE: 09/23/2020  PREOPERATIVE DIAGNOSIS:  Severe 2-vessel coronary artery disease, status post cardiac arrest and mild aortic stenosis.  POSTOPERATIVE DIAGNOSIS:  Severe 2-vessel coronary artery disease, status post cardiac arrest and mild aortic stenosis.  PROCEDURE:   Median sternotomy, extracorporeal circulation, Coronary artery bypass grafting x 3  Left internal mammary artery to LAD, Saphenous vein graft to first diagonal, Saphenous vein graft to posterior descending Endoscopic vein harvest of left leg.  SURGEON:  Revonda Standard. Roxan Hockey, MD  ASSISTANT:  Ellwood Handler, PA  ANESTHESIA:  General.  FINDINGS:  Transesophageal echocardiography showed ejection fraction of approximately 35%. Stenotic aortic valve, but only 12 mm gradient consistent with mild aortic stenosis by TEE.  Good quality targets, good quality conduits.  Ramus intermedius too  small to graft.  Post-bypass TEE showed slight improvement in ventricular function with low-dose milrinone and epinephrine.  Transverse sternal fracture. Chest wall and mediastinal hematoma.  CLINICAL NOTE:  Mr. Groft is a 75 year old gentleman who presented after an out-of-hospital cardiac arrest.  He ruled in for myocardial infarction.  He had some anoxic encephalopathy, but his mental status has improved.  A catheterization was found to have  total occlusion of his LAD as well as a tight right coronary lesion and 70% stenosis in a small ramus intermedius branch.  There was no hemodynamically significant disease in the circumflex system.  Transthoracic echocardiography revealed mild aortic  stenosis. By catheterization, there also was mild aortic stenosis with a peak gradient of 15 mmHg.  He was advised to undergo coronary artery bypass  grafting with further assessment of the aortic valve with transesophageal echocardiography with decision  to be made based on intraoperative findings whether or not to replace the valve.  The indications, risks, benefits, and alternatives were discussed in detail with the patient and his family.  He understood and accepted the risks and agreed to proceed.  OPERATIVE NOTE:  Mr. Roy was brought to the preoperative holding area on 09/23/2020.  Anesthesia placed a Swan-Ganz catheter and an arterial blood pressure monitoring line.  He was taken to the operating room, anesthetized, and intubated.  A Foley  catheter was placed.  Intravenous antibiotics were administered.  Transesophageal echocardiography was performed by Dr. Suzette Battiest with consultation with Dr. Oleta Mouse.  Findings as noted above.  Ejection fraction was approximately 35%.   There was some stenosis of the aortic valve, but only a very mild stenosis by gradient consistent with transthoracic and catheterization findings.  Decision was made to proceed with coronary artery bypass grafting and defer aortic valve to possible TAVR  in the future should the stenosis worsen. The chest, abdomen, and legs were prepped and draped in the usual sterile fashion.  It should be noted that the patient did have elevated pulmonary artery pressures with PA pressures of 60/30 at the time of  induction.  A timeout was performed.  A median sternotomy was performed.  There was a transverse sternal fracture in the lower third of the sternum.  A Rultract retractor was placed and the left internal mammary artery was harvested under direct vision.  There was  extensive hematoma of the chest wall.  Simultaneously with mammary harvest, an incision was made in the medial aspect of the right leg.  The greater saphenous vein was identified, but was a small vessel, that  was not suitable for use as a bypass graft.   The vein was not harvested from the right leg.   An incision was made in the medial aspect of the left leg.  Saphenous vein was much better quality there and was harvested from the upper calf to the groin endoscopically.  It was a good quality conduit.   2000 units of heparin was administered during the vessel harvest.  The remainder of the heparin dose was given prior to opening the pericardium.  After harvesting the conduit, the sternal retractor was placed and gently opened over time.  The pericardium was opened.  The ascending aorta was inspected.  There was no evidence of atherosclerotic disease.  After confirming adequate anticoagulation  with ACT measurement, the aorta was cannulated via concentric 2-0 Ethibond pledgeted pursestring sutures.  A dual stage venous cannula was placed via a pursestring suture in the right atrial appendage.  Cardiopulmonary bypass was initiated.  Flows were  maintained per protocol.  The patient was cooled to 32 degrees Celsius.  The coronary arteries were inspected.  The ramus intermedius branch was too small to graft where it could be visualized.  The remaining targets appeared to be good quality targets.   The conduits were inspected and prepared.  A foam pad was placed in the pericardium to insulate the heart.  A temperature probe was placed in the myocardial septum.  A retrograde cardioplegic cannula was placed via a pursestring suture in the right  atrium and directed into the coronary sinus.  An antegrade cardioplegia cannula was placed in the ascending aorta.  The aorta was cross clamped.  The left ventricle was emptied via the aortic root vent. Cardiac arrest then was achieved with combination of cold antegrade and retrograde blood cardioplegia.  Initial 500 mL of cardioplegia was given antegrade.  There was  a rapid diastolic arrest, but relatively slow cooling. 1500 mL of cardioplegia was administered retrograde, and there was septal cooling to 10 degrees Celsius.  A reversed saphenous vein graft was  placed end-to-side to the posterior descending branch of the right coronary artery.  This was a 2 mm, good quality target at the site of the anastomosis.  The vein graft was of good quality.  It was anastomosed end-to-side with  a running 7-0 Prolene suture.  After completion of the anastomosis, a probe passed easily proximally and distally.  Cardioplegia was administered down the graft and there was good flow and good hemostasis.  Next, a reversed saphenous vein graft was placed end-to-side to the first diagonal branch of the LAD.  This was a 1.5 mm target vessel.  It bifurcated just beyond the anastomosis and a 1 mm probe passed down each limb of the bifurcation.  The saphenous  vein was anastomosed to the diagonal with a running 7-0 Prolene suture.  Again, a probe passed easily proximally and distally, and there was good flow and good hemostasis with cardioplegia administration.  Additional cardioplegia was also administered  via the retrograde cannula.    Next, the left internal mammary artery was brought through a window in the pericardium.  The distal end was bevelled.  It was a 2 mm, good quality conduit.  It was anastomosed end-to-side to the distal LAD.  The distal LAD  was grafted beyond some plaque, but was a good quality vessel out to the apex from there. A probe passed easily.  It was a 2 mm vessel.  The mammary was sewn end-to-side to the LAD with  a running 8-0 Prolene suture.  At the completion of the mammary to  LAD anastomosis, the bulldog clamp was briefly removed to inspect for hemostasis.  Rapid septal re-warm was noted.  The bulldog clamp was replaced.  The mammary pedicle was tacked to the epicardial surface of the heart with 6-0 Prolene sutures.  Additional cardioplegia was administered.  The vein grafts were cut to length.  The cardioplegia cannula was removed from the ascending aorta.  The proximal vein graft anastomoses were performed to 4.5 mm punch aortotomies with running 6-0  Prolene  sutures.  At the completion of the final proximal anastomosis, 250 mL of cardioplegia was administered retrograde in a warm dose.  The bulldog clamp was again removed from the left mammary artery.  The aortic root was de-aired, and the aortic crossclamp  was removed.  Total crossclamp time was 68 minutes.  A single defibrillation with 10 joules was required.  The patient then was in a bradycardic rhythm. While rewarming was completed, all proximal and distal anastomoses were inspected for hemostasis.  Epicardial pacing wires were placed on the right  ventricle and right atrium.  Atrial pacing was initiated for bradycardia.  When the patient had rewarmed to a core temperature of 36.5 degrees Celsius, he was weaned from cardiopulmonary bypass on the first attempt.  He was on milrinone infusion at 0.25  mcg per kilogram per minute at the time of separation from bypass. An epinephrine drip at 1 mg per minute was started shortly after separation from bypass.  Initial cardiac index was only about 1.5 liters per minute per meter squared, but the cardiac  index improved with volume administration and left ventricular function improved with time based on appearance of the transesophageal echocardiogram.  A test dose of protamine was administered and was well tolerated.  The atrial and aortic cannulae were removed.  The remainder of the protamine was administered without incident.  The chest was copiously irrigated with warm saline.  Hemostasis was  achieved.  Left pleural and mediastinal chest tubes were placed through separate subcostal incisions.  The sternum was closed with a combination of single and double simple and figure-of-eight heavy gauge stainless steel wires.  The pectoralis fascia,  subcutaneous tissue, and skin were closed in standard fashion.  The leg incisions were closed in standard fashion.  All sponge, needle, and instrument counts were correct at the end of the procedure. The patient  was taken from the operating room to the  surgical intensive care unit intubated and in fair condition.   ROH D: 09/23/2020 5:36:00 pm T: 09/23/2020 9:09:00 pm  JOB: [id number]/ [id number]

## 2020-09-23 NOTE — Anesthesia Preprocedure Evaluation (Signed)
Anesthesia Evaluation  Patient identified by MRN, date of birth, ID band Patient awake    Reviewed: Allergy & Precautions, NPO status , Patient's Chart, lab work & pertinent test results  Airway Mallampati: II  TM Distance: >3 FB Neck ROM: Full    Dental  (+) Dental Advisory Given   Pulmonary neg pulmonary ROS,    breath sounds clear to auscultation       Cardiovascular hypertension, Pt. on medications + CAD  + Valvular Problems/Murmurs AS  Rhythm:Regular Rate:Normal     Neuro/Psych negative neurological ROS     GI/Hepatic negative GI ROS, Neg liver ROS,   Endo/Other  diabetes, Type 2, Oral Hypoglycemic Agents  Renal/GU Renal InsufficiencyRenal disease     Musculoskeletal   Abdominal   Peds  Hematology  (+) anemia ,   Anesthesia Other Findings   Reproductive/Obstetrics                             Lab Results  Component Value Date   WBC 17.8 (H) 09/23/2020   HGB 8.3 (L) 09/23/2020   HCT 24.3 (L) 09/23/2020   MCV 86.5 09/23/2020   PLT 254 09/23/2020   Lab Results  Component Value Date   CREATININE 1.20 09/23/2020   BUN 39 (H) 09/23/2020   NA 129 (L) 09/23/2020   K 3.6 09/23/2020   CL 92 (L) 09/23/2020   CO2 25 09/23/2020    Anesthesia Physical Anesthesia Plan  ASA: 4  Anesthesia Plan: General   Post-op Pain Management:    Induction: Intravenous  PONV Risk Score and Plan: 2 and Dexamethasone, Ondansetron and Treatment may vary due to age or medical condition  Airway Management Planned: Oral ETT  Additional Equipment: Arterial line, CVP, PA Cath, TEE and Ultrasound Guidance Line Placement  Intra-op Plan:   Post-operative Plan: Post-operative intubation/ventilation  Informed Consent: I have reviewed the patients History and Physical, chart, labs and discussed the procedure including the risks, benefits and alternatives for the proposed anesthesia with the patient or  authorized representative who has indicated his/her understanding and acceptance.     Dental advisory given  Plan Discussed with: CRNA  Anesthesia Plan Comments:         Anesthesia Quick Evaluation

## 2020-09-23 NOTE — Interval H&P Note (Signed)
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  The various methods of treatment have been discussed with the patient and family. After consideration of risks, benefits and other options for treatment, the patient has consented to  Procedure(s): CORONARY ARTERY BYPASS GRAFTING (CABG) (N/A) TRANSESOPHAGEAL ECHOCARDIOGRAM (TEE) (N/A) POSSIBLE AORTIC VALVE REPLACEMENT (AVR) (N/A) as a surgical intervention.  The patient's history has been reviewed, patient examined, no change in status, stable for surgery.  I have reviewed the patient's chart and labs.  Questions were answered to the patient's satisfaction.     [name]

## 2020-09-23 NOTE — Progress Notes (Signed)
[facility] BRIEF SICU PROGRESS NOTE  Day of Surgery  S/P Procedure(s) (LRB): CORONARY ARTERY BYPASS GRAFTING (CABG), ON PUMP, TIMES THREE, USING LEFT INTERNAL MAMMARY ARTERY AND LEFT ENDOSCOPICALLY HARVESTED GREATER SAPHENOUS VEIN (N/A) TRANSESOPHAGEAL ECHOCARDIOGRAM (TEE) (N/A) ENDOVEIN HARVEST OF GREATER SAPHENOUS VEIN (Left)   Restless and somewhat agitated - didn't do well w/ initial attempt to wean vent NSR w/ stable hemodynamics on low dose Epi and milrinone O2 sats 100% Chest tube output low UOP 40-60 mL/hr Labs okay although Hgb 7.4  Plan: Continue routine early postop.  Watch Hgb and consider transfusion.  Wean vent when more alert  Rexene Alberts, MD 09/23/2020 6:17 PM

## 2020-09-23 NOTE — Progress Notes (Signed)
Rapid wean attempted x 2 without success. During both weaning attempts the patient had increased RR & decreased tidal volumes. Patient is also experiencing movements that seem to be involuntary.

## 2020-09-23 NOTE — Brief Op Note (Addendum)
09/16/2020 - 09/23/2020  1:12 PM  PATIENT:  Brian Robinson  75 y.o. male  PRE-OPERATIVE DIAGNOSIS:  Coronary Artery Disease, s/p cardiac arrest, mild aortic stenosis  POST-OPERATIVE DIAGNOSIS:  Coronary Artery Disease, s/p cardiac arrest, mild aortic stenosis  PROCEDURE:  Procedure(s):  CORONARY ARTERY BYPASS GRAFTING x 3 -LIMA to LAD -SVG to DIAGONAL -SVG to PDA  ENDOSCOPIC HARVEST GREATER SAPHENOUS VEIN -Left Leg (40/15) -Right Leg explored, conduit felt to be small  TRANSESOPHAGEAL ECHOCARDIOGRAM (TEE) (N/A)  SURGEON:  Surgeon(s) and Role:    * [name], MD - Primary  PHYSICIAN ASSISTANT: Ellwood Handler PA-C   ASSISTANTS: Ara Kussmaul RNFA  ANESTHESIA:   general  EBL:  600 mL   BLOOD ADMINISTERED: 2 Units CC PRBC and   CC CELLSAVER  DRAINS:  Left Pleural Chest Tubes, Mediastinal Chest Drains    LOCAL MEDICATIONS USED:  NONE  SPECIMEN:  No Specimen  DISPOSITION OF SPECIMEN:  N/A  COUNTS:  YES  TOURNIQUET:  * No tourniquets in log *  DICTATION: .Dragon Dictation  PLAN OF CARE: Admit to inpatient   PATIENT DISPOSITION:  ICU - intubated and hemodynamically stable.   Delay start of Pharmacological VTE agent (>24hrs) due to surgical blood loss or risk of bleeding: yes

## 2020-09-23 NOTE — Anesthesia Procedure Notes (Signed)
Central Venous Catheter Insertion Performed by: Suzette Battiest, MD, anesthesiologist Start/End6/30/2022 7:15 AM, 09/23/2020 7:30 AM Patient location: Pre-op. Preanesthetic checklist: patient identified, IV checked, site marked, risks and benefits discussed, surgical consent, monitors and equipment checked, pre-op evaluation, timeout performed and anesthesia consent Position: Trendelenburg Lidocaine 1% used for infiltration and patient sedated Hand hygiene performed , maximum sterile barriers used  and Seldinger technique used Catheter size: 9 Fr Total catheter length 10. Central line and PA cath was placed.MAC introducer Swan type:thermodilution PA Cath depth:50 Procedure performed using ultrasound guided technique. Ultrasound Notes:anatomy identified, needle tip was noted to be adjacent to the nerve/plexus identified, no ultrasound evidence of intravascular and/or intraneural injection and image(s) printed for medical record Attempts: 1 Following insertion, line sutured, dressing applied and Biopatch. Post procedure assessment: blood return through all ports, free fluid flow and no air  Patient tolerated the procedure well with no immediate complications.

## 2020-09-23 NOTE — Transfer of Care (Signed)
Immediate Anesthesia Transfer of Care Note  Patient: Brian Robinson  Procedure(s) Performed: CORONARY ARTERY BYPASS GRAFTING (CABG), ON PUMP, TIMES THREE, USING LEFT INTERNAL MAMMARY ARTERY AND LEFT ENDOSCOPICALLY HARVESTED GREATER SAPHENOUS VEIN (Chest) TRANSESOPHAGEAL ECHOCARDIOGRAM (TEE) ENDOVEIN HARVEST OF GREATER SAPHENOUS VEIN (Left: Leg Upper)  Patient Location: ICU  Anesthesia Type:General  Level of Consciousness: Patient remains intubated per anesthesia plan  Airway & Oxygen Therapy: Patient remains intubated per anesthesia plan and Patient placed on Ventilator (see vital sign flow sheet for setting)  Post-op Assessment: Report given to RN and Post -op Vital signs reviewed and stable  Post vital signs: Reviewed and stable  Last Vitals:  Vitals Value Taken Time  BP    Temp 36 C 09/23/20 1322  Pulse 90 09/23/20 1322  Resp 12 09/23/20 1322  SpO2 100 % 09/23/20 1322  Vitals shown include unvalidated device data.  Last Pain:  Vitals:   09/23/20 0357  TempSrc: Oral  PainSc:       Patients Stated Pain Goal: 0 (40/37/54 3606)  Complications: No notable events documented.

## 2020-09-23 NOTE — Anesthesia Procedure Notes (Signed)
Central Venous Catheter Insertion Performed by: Suzette Battiest, MD, anesthesiologist Start/End6/30/2022 7:15 AM, 09/23/2020 7:30 AM Patient location: Pre-op. Preanesthetic checklist: patient identified, IV checked, site marked, risks and benefits discussed, surgical consent, monitors and equipment checked, pre-op evaluation, timeout performed and anesthesia consent Hand hygiene performed  and maximum sterile barriers used  PA cath was placed.Swan type:thermodilution PA Cath depth:50 Procedure performed without using ultrasound guided technique. Attempts: 1 Patient tolerated the procedure well with no immediate complications.

## 2020-09-23 NOTE — Plan of Care (Signed)
  Problem: Safety: Goal: Non-violent Restraint(s) Outcome: Progressing   Problem: Education: Goal: Knowledge of General Education information will improve Description: Including pain rating scale, medication(s)/side effects and non-pharmacologic comfort measures Outcome: Progressing   Problem: Health Behavior/Discharge Planning: Goal: Ability to manage health-related needs will improve Outcome: Progressing   

## 2020-09-23 NOTE — Hospital Course (Addendum)
History of Present Illness:  Mr. Brian Robinson is a 75 yo male with no prior cardiac history.  He presented to the ED via EMS after suffering a witnessed arrest.  The patient suddenly slumped to the floor.  His wife did chest compressions until EMS arrived.  On arrival he required defibrillation with ROSC.  He was severely hypertensive after ROSC.  He required intubation and was transported to the ED.  Workup ruled patient in for MI.  Hospital Course:  He was taken to the medical ICU.  He underwent EEG by neurology which showed encephalopathy.  He was extubated on 09/17/2020.  He was evaluated by Cardiology who recommended catheterization.  This was performed on 09/20/2020 and showed 3 vessel CAD.  It was felt coronary bypass grafting would be indicated and TCTS consult was obtained.  The patient was evaluated by Dr. Roxan Hockey who was in agreement coronary bypass grafting would be indicated.  The risks and benefits of the procedure were explained to the patient and he was agreeable to proceed.  The patient developed a drop in his hemoglobin level with no obvious source.  CT of the abdomen was negative.  He developed a temperature and leukocytosis with positive UA.  He was started on Rocephin for treatment.  ID consult was obtained and they did not feel patient needed treatment of asymptomatic bacteriuria.     He was taken to the operating room on 09/23/2020.  He underwent CABG x 3 utilizing LIMA to LAD, SVG to PDA, and SVG to Diagonal.  He also underwent endoscopic harvest of greater saphenous vein from his left leg.  He tolerated the procedure without difficulty and was taken to the SICU in stable condition.  The patient was extubated the evening of surgery.  During his stay in the SICU the patient was weaned off Epinephrine and Milrinone as hemodynamics allowed.  He had mild confusion due to recent anoxic encephalopathy and he re-oriented easily.  Post operative EKG showed flattened t waves, Qtc was below 500 and  this was felt to be due to post operative changes.  He had expected post operative blood loss anemia with a Hgb of 7.1.  He was transfused a unit of packed cells.  His chest tubes were removed without difficulty.  He remained in NSR and his pacing wires were removed without difficulty.  He developed Atrial Fibrillation and was treated with Amiodarone.  He developed an acute elevated in his creatinine which improved without further intervention.  He was felt medically stable for transfer to the floor today.  He remained in NSR.  He was hypertensive and started on low dose Lopressor.  He was volume overloaded and since his creatinine level had improved he was started on lasix and potassium.  His statin was held due to elevated LFTs associated with likely shock liver.  His diabetes is well controlled.  His hemoglobin level was improving.  He has extensive ecchymosis of his LLE and flank which has remained stable.  His surgical incisions are healing without evidence of infection.  He is medically stable for discharge home today.

## 2020-09-23 NOTE — Procedures (Signed)
Extubation Procedure Note  Pt extubated following Cardiac wean. A&O, ABG acceptable, NIF -20, VC 88mls, Cuff leak +, No stridor.  Pt extubated to 2L [place] and tolerating well   Patient Details:   Name: Brian Robinson DOB: Aug 01, 1945 MRN: [id number]   Airway Documentation:    Vent end date: 09/23/20 Vent end time: 2005   Evaluation  O2 sats: stable throughout Complications: No apparent complications Patient did tolerate procedure well. Bilateral Breath Sounds: Clear, Diminished   Yes  Marissa Nestle 09/23/2020, 8:24 PM

## 2020-09-23 NOTE — Progress Notes (Signed)
Patient not seen. He had CABG today. His care has been transfer to [facility]. Discussed with Dr Roxan Hockey.

## 2020-09-24 ENCOUNTER — Encounter (HOSPITAL_COMMUNITY): Payer: Self-pay | Admitting: Thoracic Surgery (Cardiothoracic Vascular Surgery)

## 2020-09-24 ENCOUNTER — Inpatient Hospital Stay (HOSPITAL_COMMUNITY): Payer: Medicare Other

## 2020-09-24 DIAGNOSIS — Z951 Presence of aortocoronary bypass graft: Secondary | ICD-10-CM

## 2020-09-24 DIAGNOSIS — G931 Anoxic brain damage, not elsewhere classified: Secondary | ICD-10-CM

## 2020-09-24 LAB — BASIC METABOLIC PANEL
Anion gap: 6 (ref 5–15)
Anion gap: 10 (ref 5–15)
BUN: 33 mg/dL — ABNORMAL HIGH (ref 8–23)
BUN: 39 mg/dL — ABNORMAL HIGH (ref 8–23)
CO2: 25 mmol/L (ref 22–32)
CO2: 24 mmol/L (ref 22–32)
Calcium: 7.5 mg/dL — ABNORMAL LOW (ref 8.9–10.3)
Calcium: 7.8 mg/dL — ABNORMAL LOW (ref 8.9–10.3)
Chloride: 100 mmol/L (ref 98–111)
Chloride: 97 mmol/L — ABNORMAL LOW (ref 98–111)
Creatinine, Ser: 1.11 mg/dL (ref 0.61–1.24)
Creatinine, Ser: 1.48 mg/dL — ABNORMAL HIGH (ref 0.61–1.24)
GFR, Estimated: >60 mL/min (ref >60)
GFR, Estimated: 49 mL/min — ABNORMAL LOW (ref >60)
Glucose, Bld: 107 mg/dL — ABNORMAL HIGH (ref 70–99)
Glucose, Bld: 131 mg/dL — ABNORMAL HIGH (ref 70–99)
Potassium: 3.7 mmol/L (ref 3.5–5.1)
Potassium: 4.6 mmol/L (ref 3.5–5.1)
Sodium: 131 mmol/L — ABNORMAL LOW (ref 135–145)
Sodium: 131 mmol/L — ABNORMAL LOW (ref 135–145)

## 2020-09-24 LAB — CBC
HCT: 20.8 % — ABNORMAL LOW (ref 39.0–52.0)
HCT: 26.1 % — ABNORMAL LOW (ref 39.0–52.0)
Hemoglobin: 7.1 g/dL — ABNORMAL LOW (ref 13.0–17.0)
Hemoglobin: 8.6 g/dL — ABNORMAL LOW (ref 13.0–17.0)
MCH: 29.8 pg (ref 26.0–34.0)
MCH: 29.5 pg (ref 26.0–34.0)
MCHC: 34.1 g/dL (ref 30.0–36.0)
MCHC: 33 g/dL (ref 30.0–36.0)
MCV: 87.4 fL (ref 80.0–100.0)
MCV: 89.4 fL (ref 80.0–100.0)
Platelets: 163 10*3/uL (ref 150–400)
Platelets: 190 10*3/uL (ref 150–400)
RBC: 2.38 MIL/uL — ABNORMAL LOW (ref 4.22–5.81)
RBC: 2.92 MIL/uL — ABNORMAL LOW (ref 4.22–5.81)
RDW: 14 % (ref 11.5–15.5)
RDW: 14.5 % (ref 11.5–15.5)
WBC: 15.3 10*3/uL — ABNORMAL HIGH (ref 4.0–10.5)
WBC: 18.9 10*3/uL — ABNORMAL HIGH (ref 4.0–10.5)
nRBC: 0.3 % — ABNORMAL HIGH (ref 0.0–0.2)
nRBC: 0.5 % — ABNORMAL HIGH (ref 0.0–0.2)

## 2020-09-24 LAB — GLUCOSE, CAPILLARY
Glucose-Capillary: 101 mg/dL — ABNORMAL HIGH (ref 70–99)
Glucose-Capillary: 103 mg/dL — ABNORMAL HIGH (ref 70–99)
Glucose-Capillary: 106 mg/dL — ABNORMAL HIGH (ref 70–99)
Glucose-Capillary: 111 mg/dL — ABNORMAL HIGH (ref 70–99)
Glucose-Capillary: 112 mg/dL — ABNORMAL HIGH (ref 70–99)
Glucose-Capillary: 112 mg/dL — ABNORMAL HIGH (ref 70–99)
Glucose-Capillary: 114 mg/dL — ABNORMAL HIGH (ref 70–99)
Glucose-Capillary: 114 mg/dL — ABNORMAL HIGH (ref 70–99)
Glucose-Capillary: 136 mg/dL — ABNORMAL HIGH (ref 70–99)
Glucose-Capillary: 76 mg/dL (ref 70–99)

## 2020-09-24 LAB — ECHO INTRAOPERATIVE TEE
AV Mean grad: 11.5 mmHg
AV Peak grad: 20.3 mmHg
Ao pk vel: 2.25 m/s
Height: 66 in
Weight: 3043.2 oz

## 2020-09-24 LAB — MAGNESIUM
Magnesium: 2.9 mg/dL — ABNORMAL HIGH (ref 1.7–2.4)
Magnesium: 2.8 mg/dL — ABNORMAL HIGH (ref 1.7–2.4)

## 2020-09-24 LAB — COOXEMETRY PANEL
Carboxyhemoglobin: 1.2 % (ref 0.5–1.5)
Methemoglobin: 1 % (ref 0.0–1.5)
O2 Saturation: 41.4 %
Total hemoglobin: 13 g/dL (ref 12.0–16.0)

## 2020-09-24 LAB — PREPARE RBC (CROSSMATCH)

## 2020-09-24 MED ORDER — ENOXAPARIN SODIUM 40 MG/0.4ML IJ SOSY
40.0000 mg | PREFILLED_SYRINGE | Freq: Every day | INTRAMUSCULAR | Status: DC
  Administered 2020-09-24 – 2020-09-26 (×3): 40 mg via SUBCUTANEOUS
  Filled 2020-09-24 (×3): qty 0.4

## 2020-09-24 MED ORDER — INSULIN ASPART 100 UNIT/ML IJ SOLN
0.0000 [IU] | Freq: Three times a day (TID) | INTRAMUSCULAR | Status: DC
  Administered 2020-09-24 – 2020-09-28 (×2): 2 [IU] via SUBCUTANEOUS

## 2020-09-24 MED ORDER — CEFAZOLIN SODIUM-DEXTROSE 2-4 GM/100ML-% IV SOLN
2.0000 g | Freq: Three times a day (TID) | INTRAVENOUS | Status: AC
  Administered 2020-09-24 – 2020-09-26 (×6): 2 g via INTRAVENOUS
  Filled 2020-09-24 (×6): qty 100

## 2020-09-24 MED ORDER — INSULIN DETEMIR 100 UNIT/ML ~~LOC~~ SOLN
15.0000 [IU] | Freq: Two times a day (BID) | SUBCUTANEOUS | Status: DC
  Administered 2020-09-24 – 2020-09-25 (×3): 15 [IU] via SUBCUTANEOUS
  Filled 2020-09-24 (×6): qty 0.15

## 2020-09-24 MED ORDER — ORAL CARE MOUTH RINSE
15.0000 mL | Freq: Two times a day (BID) | OROMUCOSAL | Status: DC
  Administered 2020-09-24 – 2020-09-25 (×2): 15 mL via OROMUCOSAL

## 2020-09-24 MED ORDER — INSULIN ASPART 100 UNIT/ML IJ SOLN: 0.0000 [IU] | Freq: Three times a day (TID) | INTRAMUSCULAR | Status: DC

## 2020-09-24 MED ORDER — DIPHENHYDRAMINE HCL 25 MG PO CAPS: 25.0000 mg | ORAL_CAPSULE | Freq: Every evening | ORAL | Status: DC | PRN

## 2020-09-24 MED ORDER — INSULIN ASPART 100 UNIT/ML IJ SOLN: 0.0000 [IU] | INTRAMUSCULAR | Status: DC

## 2020-09-24 MED ORDER — FUROSEMIDE 10 MG/ML IJ SOLN
40.0000 mg | Freq: Once | INTRAMUSCULAR | Status: AC
  Administered 2020-09-24: 40 mg via INTRAVENOUS
  Filled 2020-09-24: qty 4

## 2020-09-24 MED ORDER — SODIUM CHLORIDE 0.9% FLUSH: 10.0000 mL | INTRAVENOUS | Status: DC | PRN

## 2020-09-24 MED ORDER — SODIUM CHLORIDE 0.9% IV SOLUTION: Freq: Once | INTRAVENOUS | Status: AC

## 2020-09-24 MED ORDER — SODIUM CHLORIDE 0.9% FLUSH
10.0000 mL | Freq: Two times a day (BID) | INTRAVENOUS | Status: DC
  Administered 2020-09-24 – 2020-09-26 (×5): 10 mL

## 2020-09-24 MED ORDER — POTASSIUM CHLORIDE 10 MEQ/50ML IV SOLN
10.0000 meq | INTRAVENOUS | Status: AC
  Administered 2020-09-24 (×3): 10 meq via INTRAVENOUS
  Filled 2020-09-24 (×3): qty 50

## 2020-09-24 MED ORDER — VANCOMYCIN HCL IN DEXTROSE 1-5 GM/200ML-% IV SOLN
1000.0000 mg | Freq: Two times a day (BID) | INTRAVENOUS | Status: DC
  Filled 2020-09-24: qty 200

## 2020-09-24 MED FILL — Electrolyte-R (PH 7.4) Solution: INTRAVENOUS | Qty: 5000 | Status: AC

## 2020-09-24 MED FILL — Sodium Bicarbonate IV Soln 8.4%: INTRAVENOUS | Qty: 50 | Status: AC

## 2020-09-24 MED FILL — Heparin Sodium (Porcine) Inj 1000 Unit/ML: INTRAMUSCULAR | Qty: 10 | Status: AC

## 2020-09-24 MED FILL — Heparin Sodium (Porcine) Inj 1000 Unit/ML: INTRAMUSCULAR | Qty: 30 | Status: AC

## 2020-09-24 MED FILL — Potassium Chloride Inj 2 mEq/ML: INTRAVENOUS | Qty: 40 | Status: AC

## 2020-09-24 MED FILL — Sodium Chloride IV Soln 0.9%: INTRAVENOUS | Qty: 2000 | Status: AC

## 2020-09-24 MED FILL — Lidocaine HCl Local Preservative Free (PF) Inj 2%: INTRAMUSCULAR | Qty: 5 | Status: AC

## 2020-09-24 MED FILL — Magnesium Sulfate Inj 50%: INTRAMUSCULAR | Qty: 10 | Status: AC

## 2020-09-24 MED FILL — Mannitol IV Soln 20%: INTRAVENOUS | Qty: 500 | Status: AC

## 2020-09-24 NOTE — Plan of Care (Signed)

## 2020-09-24 NOTE — Anesthesia Postprocedure Evaluation (Signed)
Anesthesia Post Note  Patient: Brian Robinson  Procedure(s) Performed: CORONARY ARTERY BYPASS GRAFTING (CABG), ON PUMP, TIMES THREE, USING LEFT INTERNAL MAMMARY ARTERY AND LEFT ENDOSCOPICALLY HARVESTED GREATER SAPHENOUS VEIN (Chest) TRANSESOPHAGEAL ECHOCARDIOGRAM (TEE) ENDOVEIN HARVEST OF GREATER SAPHENOUS VEIN (Left: Leg Upper)     Patient location during evaluation: ICU Anesthesia Type: General Level of consciousness: awake and alert Pain management: pain level controlled Vital Signs Assessment: post-procedure vital signs reviewed and stable Respiratory status: spontaneous breathing, nonlabored ventilation, respiratory function stable and patient connected to nasal cannula oxygen Cardiovascular status: blood pressure returned to baseline and stable Postop Assessment: no apparent nausea or vomiting Anesthetic complications: no   No notable events documented.  Last Vitals:  Vitals:   09/24/20 0830 09/24/20 0836  BP: 102/72   Pulse: 88 89  Resp: 19 (!) 22  Temp: 36.8 C 36.7 C  SpO2: 100% 99%    Last Pain:  Vitals:   09/24/20 0849  TempSrc:   PainSc: 3                  Tiajuana Amass

## 2020-09-24 NOTE — Discharge Summary (Addendum)
[address]       [place] [address]             [phone]    Physician Discharge Summary  Patient ID: Brian Robinson MRN: [id number] DOB/AGE: 1945-04-05 75 y.o.  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 is a 75 yo male with no prior cardiac history.  He presented to the ED via EMS after suffering a witnessed arrest.  The patient suddenly slumped to the floor.  His wife did chest compressions until EMS arrived.  On arrival he required defibrillation with ROSC.  He was severely hypertensive after ROSC.  He required intubation and was transported to the ED.  Workup ruled patient in for MI.  Hospital Course:  He was taken to the medical ICU.  He underwent EEG by neurology which showed encephalopathy.  He was extubated on 09/17/2020.  He was evaluated by Cardiology who recommended catheterization.  This was performed on 09/20/2020 and showed 3 vessel CAD.  It was felt coronary bypass grafting would be indicated and TCTS consult was obtained.  The patient was evaluated by Dr. Roxan Hockey who was in agreement coronary bypass grafting would be indicated.  The risks and benefits of the procedure were explained to the patient and he was agreeable to proceed.  The patient developed a drop in his hemoglobin level with no obvious source.  CT of the abdomen was negative.  He developed a temperature and leukocytosis with positive UA.  He was started on Rocephin for treatment.  ID consult was obtained and they did not  feel patient needed treatment of asymptomatic bacteriuria.     He was taken to the operating room on 09/23/2020.  He underwent CABG x 3 utilizing LIMA to LAD, SVG to PDA, and SVG to Diagonal.  He also underwent endoscopic harvest of greater saphenous vein from his left leg.  He tolerated the procedure without difficulty and was taken to the SICU in stable condition.  The patient was extubated the evening of surgery.  During his stay in the SICU the patient was weaned off Epinephrine and Milrinone as hemodynamics allowed.  He had mild confusion due to recent anoxic encephalopathy and he re-oriented easily.  Post operative EKG showed flattened t waves, Qtc was below 500 and this was felt to be due to post operative changes.  He had expected post operative blood loss anemia with a Hgb of 7.1.  He was transfused a unit of packed cells.  His chest tubes were removed without difficulty.  He remained in NSR and his pacing wires were removed without difficulty.  He developed Atrial Fibrillation and was treated with Amiodarone.  He developed an acute elevated in his creatinine which improved without further intervention.  He was felt medically stable for transfer to the floor today.  He remained in NSR.  He was hypertensive and started on low dose Lopressor.  He was volume overloaded and since his creatinine level had improved  he was started on lasix and potassium.  His statin was held due to elevated LFTs associated with likely shock liver.  His AST was decreased to 248 and ALT was decreased to 128 on 07/05. His diabetes is well controlled.  He will be restarted on Metformin at discharge. His per op HGA1C is 5.4. His hemoglobin level was improving.  He has extensive ecchymosis of his LLE and flank which has remained stable.  His surgical incisions are healing without evidence of infection.  As discussed with Dr. Roxan Hockey, we stopped Lopressor and restarted low dose Atenolol. We will consider restarting Chlorthalidone  (as on prior to surgery) after discharge once Lasix finished. Chest tube sutures will be removed today. As discussed with surgeon, once [place] has been arranged, he is medically stable for discharge home today.  Consults: neurology  Significant Diagnostic Studies: angiography:   Severe three-vessel coronary artery disease, including chronic total occlusion of proximal LAD with left-to-left collaterals, 75% ostial/proximal ramus intermedius stenosis, and sequential 75% ostial and 90% distal RCA lesions.  LCx and OM3 have mild to moderate disease of up to 40%. Moderately elevated left ventricular filling pressure (LVEDP ~30 mmHg). Mild aortic stenosis (peak-to-peak gradient 10-15 mmHg.   Recommendations: Imaged reviewed with Dr. Burt Knack during the procedure.  We will plan for cardiac surgery consultation for CABG given severe LAD and RCA disease with good distal targets in both vessels. Defer reinitiation of IV heparin, as the coronary artery disease appears chronic and hemoglobin has trended down significantly since admission. Aggressive secondary prevention. Diurese with furosemide 40 mg IV x 1.  Further diureses to be based on urine output, volume status, and renal function.   Treatments: surgery:   DATE OF PROCEDURE: 09/23/2020   PREOPERATIVE DIAGNOSIS:  Severe 2-vessel coronary artery disease, status post cardiac arrest and mild aortic stenosis.   POSTOPERATIVE DIAGNOSIS:  Severe 2-vessel coronary artery disease, status post cardiac arrest and mild aortic stenosis.   PROCEDURE:  Median sternotomy, extracorporeal circulation, coronary artery bypass grafting x3 (left internal mammary artery to LAD, saphenous vein graft to first diagonal, saphenous vein graft to posterior descending), endoscopic vein harvest of left leg.   SURGEON:  Revonda Standard. Roxan Hockey, MD   ASSISTANT:  Ellwood Handler, PA-C  Discharge Exam: Blood pressure (!) 164/82, pulse 79, temperature 98.5 F (36.9 C),  temperature source Oral, resp. rate 20, height 5\' 6"  (1.676 m), weight 88.1 kg, SpO2 98 %. Cardiovascular: RRR Pulmonary: Slightly diminished bibasilar breath sounds Abdomen: Soft, non tender, bowel sounds present. Extremities: Mild bilateral lower extremity edema. Bruise right posterior shoulder and both hip areas Wounds: Clean and dry.  No erythema or signs of infection.   Discharge Medications:  The patient has been discharged on:   1.Beta Blocker:  Yes [ X  ]                              No   [   ]                              If No, reason:  2.Ace Inhibitor/ARB: Yes [  x ]                                     No  [   ]  If No, reason  3.Statin:   Yes [ X ]                  No  [  x ]                  If No, reason:Elevated transaminases. Once LFTs normalized, will restart statin  4.Ecasa:  Yes  [ X  ]                  No   [   ]                  If No, reason:     Allergies as of 09/28/2020   No Known Allergies      Medication List     STOP taking these medications    atenolol-chlorthalidone 100-25 MG tablet Commonly known as: TENORETIC   atorvastatin 10 MG tablet Commonly known as: LIPITOR       TAKE these medications    acetaminophen 500 MG tablet Commonly known as: TYLENOL Take 2 tablets (1,000 mg total) by mouth every 6 (six) hours as needed.   aspirin 325 MG EC tablet Take 1 tablet (325 mg total) by mouth daily. What changed:  medication strength how much to take when to take this additional instructions Another medication with the same name was removed. Continue taking this medication, and follow the directions you see here.   atenolol 50 MG tablet Commonly known as: TENORMIN Take 1 tablet (50 mg total) by mouth daily.   diphenhydrAMINE 25 mg capsule Commonly known as: BENADRYL Take 25 mg by mouth every 6 (six) hours as needed for allergies or sleep.   diphenhydramine-acetaminophen 25-500 MG Tabs  tablet Commonly known as: TYLENOL PM Take 1-2 tablets by mouth at bedtime as needed (for sleep).   furosemide 40 MG tablet Commonly known as: LASIX Take 1 tablet (40 mg total) by mouth daily. For 5 days then stop.   losartan 25 MG tablet Commonly known as: COZAAR Take 1 tablet (25 mg total) by mouth daily.   metFORMIN 500 MG tablet Commonly known as: GLUCOPHAGE Take 500 mg by mouth 2 (two) times daily with a meal.   potassium chloride SA 20 MEQ tablet Commonly known as: KLOR-CON Take 1 tablet (20 mEq total) by mouth daily. For 5 days then stop. Start taking on: September 29, 2020   PreserVision AREDS 2 Caps Take 1 capsule by mouth 2 (two) times daily.   sertraline 50 MG tablet Commonly known as: ZOLOFT Take 50 mg by mouth in the morning.   traMADol 50 MG tablet Commonly known as: ULTRAM Take 1 tablet (50 mg total) by mouth every 6 (six) hours as needed for moderate pain.        Follow-up [facility] Follow up on 10/12/2020.   Specialty: Cardiology Why: Appointment is at 9:00 Contact information: [address] [place] [address] [phone]        [name], MD Follow up on 10/19/2020.   Specialty: Cardiothoracic Surgery Why: Appointment is at 9:30, please get CXR at 9:00 at [facility] located on first floor of our office building Contact information: [place] [address] [phone]                 Signed: Lars Pinks PA-C 09/28/2020, 8:10 AM

## 2020-09-24 NOTE — Discharge Instructions (Signed)

## 2020-09-24 NOTE — Progress Notes (Signed)
Progress Note  Patient Name: Brian Robinson Date of Encounter: 09/24/2020  [facility] Cardiologist: None   Subjective   Doing well following CABG yesterday.  No shortness of breath.  Wife at bedside.  Some confusion noted but patient responds appropriately to questions.  Inpatient Medications    Scheduled Meds:  sodium chloride   Intravenous Once   acetaminophen  1,000 mg Oral Q6H   Or   acetaminophen (TYLENOL) oral liquid 160 mg/5 mL  1,000 mg Per Tube Q6H   aspirin EC  325 mg Oral Daily   Or   aspirin  324 mg Per Tube Daily   bisacodyl  10 mg Oral Daily   Or   bisacodyl  10 mg Rectal Daily   Chlorhexidine Gluconate Cloth  6 each Topical Daily   docusate sodium  200 mg Oral Daily   enoxaparin (LOVENOX) injection  40 mg Subcutaneous QHS   insulin aspart  0-24 Units Subcutaneous Q4H   insulin detemir  15 Units Subcutaneous BID   mouth rinse  15 mL Mouth Rinse BID   metoprolol tartrate  12.5 mg Oral BID   Or   metoprolol tartrate  12.5 mg Per Tube BID   [START ON 09/25/2020] pantoprazole  40 mg Oral Daily   rosuvastatin  20 mg Oral Daily   sertraline  50 mg Oral Daily   sodium chloride flush  10-40 mL Intracatheter Q12H   sodium chloride flush  3 mL Intravenous Q12H   Continuous Infusions:  sodium chloride Stopped (09/24/20 0520)   sodium chloride     sodium chloride     albumin human 12.5 g (09/24/20 0635)   dexmedetomidine (PRECEDEX) IV infusion 0.4 mcg/kg/hr (09/24/20 0600)   epinephrine 1 mcg/min (09/24/20 0600)   famotidine (PEPCID) IV Stopped (09/23/20 1434)   insulin 0.8 Units/hr (09/24/20 0600)   lactated ringers     lactated ringers Stopped (09/23/20 1353)   lactated ringers 10 mL/hr at 09/24/20 0600   milrinone 0.125 mcg/kg/min (09/24/20 0730)   nitroGLYCERIN Stopped (09/23/20 1817)   phenylephrine (NEO-SYNEPHRINE) Adult infusion 30 mcg/min (09/24/20 0617)   potassium chloride 10 mEq (09/24/20 0635)   vancomycin     PRN Meds: sodium chloride,  albumin human, dextrose, diphenhydrAMINE, lactated ringers, metoprolol tartrate, midazolam, morphine injection, ondansetron (ZOFRAN) IV, oxyCODONE, sodium chloride flush, sodium chloride flush, traMADol   Vital Signs    Vitals:   09/24/20 0700 09/24/20 0815 09/24/20 0821 09/24/20 0830  BP:    102/72  Pulse: 85 84 87 88  Resp: (!) 24 (!) 23 20 19   Temp: 98.4 F (36.9 C) 98.06 F (36.7 C) 98.06 F (36.7 C) 98.24 F (36.8 C)  TempSrc: Core Core (Comment)    SpO2: 100% 97% 98% 100%  Weight:      Height:        Intake/Output Summary (Last 24 hours) at 09/24/2020 0843 Last data filed at 09/24/2020 0800 Gross per 24 hour  Intake 4577.1 ml  Output 2585 ml  Net 1992.1 ml   Last 3 Weights 09/24/2020 09/23/2020 09/22/2020  Weight (lbs) 203 lb 1.6 oz 190 lb 3.2 oz 198 lb 12.8 oz  Weight (kg) 92.126 kg 86.274 kg 90.175 kg      Telemetry    Sinus rhythm with PVCs and periods of trigeminy - Personally Reviewed   Physical Exam  Awake and alert, no acute distress GEN: No acute distress.   Neck: No JVD Cardiac: RRR, no murmurs, rubs, or gallops.  Respiratory: Clear to auscultation  bilaterally. GI: Soft, nontender, non-distended  MS: 2+ pretibial edema; No deformity. Neuro:  Nonfocal  Psych: Normal affect   Labs    High Sensitivity Troponin:   Recent Labs  Lab 09/16/20 1726 09/16/20 1927 09/17/20 0829 09/17/20 1059  TROPONINIHS 155* 1,177* 831* 665*      Chemistry Recent Labs  Lab 09/19/20 0344 09/20/20 0431 09/21/20 0219 09/22/20 0349 09/23/20 0203 09/23/20 0822 09/23/20 1221 09/23/20 1330 09/23/20 1858 09/23/20 2002 09/23/20 2118 09/24/20 0404  NA 135 130* 132*   < > 129*   < > 133*   < > 128* 135 134* 131*  K 3.8 3.5 3.8   < > 3.6   < > 4.3   < > 3.8 4.0 4.0 3.7  CL 96* 91* 94*   < > 92*   < > 96*  --  97*  --   --  100  CO2 29 28 26    < > 25  --   --   --  21*  --   --  25  GLUCOSE 139* 117* 138*   < > 132*   < > 137*  --  119*  --   --  107*  BUN 26* 28* 29*    < > 39*   < > 32*  --  34*  --   --  33*  CREATININE 0.92 0.86 1.09   < > 1.20   < > 0.90  --  0.98  --   --  1.11  CALCIUM 8.8* 8.8* 8.9   < > 8.8*  --   --   --  7.0*  --   --  7.5*  PROT 6.7 6.5 7.0  --   --   --   --   --   --   --   --   --   ALBUMIN 3.1* 3.2* 3.3*  --   --   --   --   --   --   --   --   --   AST 80* 133* 131*  --   --   --   --   --   --   --   --   --   ALT 42 42 43  --   --   --   --   --   --   --   --   --   ALKPHOS 54 47 50  --   --   --   --   --   --   --   --   --   BILITOT 1.1 1.3* 1.4*  --   --   --   --   --   --   --   --   --   GFRNONAA >60 >60 >60   < > >60  --   --   --  >60  --   --  >60  ANIONGAP 10 11 12    < > 12  --   --   --  10  --   --  6   < > = values in this interval not displayed.     Hematology Recent Labs  Lab 09/23/20 1330 09/23/20 1858 09/23/20 2002 09/23/20 2118 09/24/20 0404  WBC 21.2* 16.8*  --   --  15.3*  RBC 2.53* 2.33*  --   --  2.38*  HGB 8.2*  7.4* 7.2* 6.1* 6.5* 7.1*  HCT 24.0*  22.3* 20.5*  18.0* 19.0* 20.8*  MCV 88.1 88.0  --   --  87.4  MCH 29.2 30.9  --   --  29.8  MCHC 33.2 35.1  --   --  34.1  RDW 13.5 13.8  --   --  14.0  PLT 130* 141*  --   --  163    BNPNo results for input(s): BNP, PROBNP in the last 168 hours.   DDimer No results for input(s): DDIMER in the last 168 hours.   Radiology    CT ABDOMEN PELVIS WO CONTRAST  Result Date: 09/22/2020 CLINICAL DATA:  75 year old male status post cardiac arrest. Unexplained anemia, query retroperitoneal bleed. EXAM: CT ABDOMEN AND PELVIS WITHOUT CONTRAST TECHNIQUE: Multidetector CT imaging of the abdomen and pelvis was performed following the standard protocol without IV contrast. COMPARISON:  CTA chest 09/16/2020. FINDINGS: Lower chest: Small bilateral layering pleural effusions are new since 09/16/2020. Bilateral simple fluid density favors transudate. No pericardial effusion. Mild associated lung base atelectasis. Mild superimposed lung base pulmonary septal  thickening. Hepatobiliary: Mild motion artifact in the upper abdomen today. Noncontrast liver and gallbladder appear to remain negative. Pancreas: Negative. Spleen: Negative; multiple small calcified splenic granulomas. Adrenals/Urinary Tract: Negative adrenal glands. Nonobstructed kidneys. Renal vascular calcifications. No obvious nephrolithiasis. No pararenal or retroperitoneal hematoma. Decompressed ureters. Diminutive urinary bladder with high density urine. No perivesical inflammatory stranding. Stomach/Bowel: Mild stool ball in the rectum. Decompressed upstream large bowel. The cecum is on a lax mesentery in the right upper quadrant. Normal appendix visible on coronal image 58. No large bowel inflammation. No dilated small bowel. Stomach and duodenum also decompressed. No free air. No free fluid. Vascular/Lymphatic: Extensive Aortoiliac calcified atherosclerosis. Normal caliber abdominal aorta. Vascular patency is not evaluated in the absence of IV contrast. No lymphadenopathy. Reproductive: Negative. Other: No pelvic free fluid. Musculoskeletal: Anterior rib fractures, most notably the anterior left 4th and 5th ribs. Degenerative changes in the spine. No other No acute osseous abnormality identified. IMPRESSION: 1. Anterior rib fractures, likely CPR related. Small layering pleural effusions are new since 09/16/2020 but with simple fluid density favors transudate. 2. Hyperdense urine within the bladder raises the possibility of Gross Hematuria. Negative non-contrast kidneys, ureters. 3. No other evidence of hemorrhage in the abdomen or pelvis. No other acute or inflammatory process identified. 4. Aortic Atherosclerosis (ICD10-I70.0). Electronically Signed   By: Genevie Ann M.D.   On: 09/22/2020 09:44   DG Chest Port 1 View  Result Date: 09/24/2020 CLINICAL DATA:  Status post CABG EXAM: PORTABLE CHEST 1 VIEW COMPARISON:  09/23/2020 FINDINGS: An endotracheal tube and NG tube have been removed. Cardiomediastinal  silhouette is unchanged. A RIGHT IJ Swan-Ganz catheter with tip overlying the main pulmonary artery, and mediastinal and LEFT thoracostomy tube is again noted. Pulmonary vascular congestion is slightly decreased. Mild bibasilar atelectasis again noted, improved on the RIGHT. No pneumothorax. IMPRESSION: 1. Slightly decreased pulmonary vascular congestion and improved RIGHT basilar atelectasis. 2. Endotracheal tube and NG tube removal. Electronically Signed   By: Margarette Canada M.D.   On: 09/24/2020 06:59   DG Chest Port 1 View  Result Date: 09/23/2020 CLINICAL DATA:  Status post CABG. EXAM: PORTABLE CHEST 1 VIEW COMPARISON:  Chest x-ray dated September 21, 2020. FINDINGS: Endotracheal tube in position with the tip 3.1 cm above the level of the carina. Enteric tube in the stomach. Right internal jugular Swan-Ganz catheter with the tip in the main pulmonary outflow tract. Mediastinal and left chest tubes are in good position. Interval CABG.  Normal heart size. Low lung volumes with bronchovascular crowding. Bibasilar atelectasis. Possible small bilateral pleural effusions. No pneumothorax. No acute osseous abnormality. IMPRESSION: 1. Appropriate positioning of the lines and tubes. No pneumothorax. 2. Low lung volumes with bibasilar atelectasis and possible small bilateral pleural effusions. Electronically Signed   By: Titus Dubin M.D.   On: 09/23/2020 14:12     Patient Profile     75 y.o. male with PMH of HTN, type 2 DM, mild aortic stenosis, COVID-19 on 01/2020, cardiology is consulted and following for witnessed cardiac arrest since 09/17/20.   Assessment & Plan    1.  Non-STEMI: Multivessel coronary artery disease, now postoperative day 1 from CABG.  Patient doing very well.  Underwent SVG to diagonal, LIMA to LAD, SVG to PDA.  Patient on aspirin, metoprolol, and rosuvastatin.  Consider addition of clopidogrel once his tubes and lines are out. 2.  Out of hospital ventricular fibrillation cardiac arrest:  Likely ischemic etiology, but patient noted to have LV dysfunction -Intra-Op transesophageal echo demonstrating LVEF in the 35 to 40% range.  Favor discharge on a LifeVest and reassessment of LV function and 3 months.  Will discuss with electrophysiology team.  Reviewed this with the patient's wife at the bedside today.  Other issues per cardiac surgical team.  We will follow.  For questions or updates, please contact [place] Please consult [email or web address] for contact info under        Signed, Sherren Mocha, MD  09/24/2020, 8:43 AM

## 2020-09-24 NOTE — Progress Notes (Addendum)
Chart check   Blood cx negative Wbc trending down  -e faecalis in urine is assymptomatic bacteriuria -no evidence infection -will stop vancomycin; ceftriaxone already stopped. -finish 48 hours cefazolin CABG prophylaxis -please reengage id if any other question

## 2020-09-24 NOTE — Progress Notes (Signed)
1 Day Post-Op Procedure(s) (LRB): CORONARY ARTERY BYPASS GRAFTING (CABG), ON PUMP, TIMES THREE, USING LEFT INTERNAL MAMMARY ARTERY AND LEFT ENDOSCOPICALLY HARVESTED GREATER SAPHENOUS VEIN (N/A) TRANSESOPHAGEAL ECHOCARDIOGRAM (TEE) (N/A) ENDOVEIN HARVEST OF GREATER SAPHENOUS VEIN (Left) Subjective: Some mild confusion  Objective: Vital signs in last 24 hours: Temp:  [96.98 F (36.1 C)-98.42 F (36.9 C)] 98.4 F (36.9 C) (07/01 0700) Pulse Rate:  [74-100] 85 (07/01 0700) Cardiac Rhythm: Normal sinus rhythm (07/01 0000) Resp:  [0-34] 24 (07/01 0700) BP: (79-130)/(47-87) 117/73 (07/01 0600) SpO2:  [95 %-100 %] 100 % (07/01 0700) Arterial Line BP: (81-163)/(25-74) 132/54 (07/01 0700) FiO2 (%):  [40 %] 40 % (06/30 1935) Weight:  [92.1 kg] 92.1 kg (07/01 0500)  Hemodynamic parameters for last 24 hours: PAP: (41-68)/(19-31) 49/21 CO:  [4.4 L/min-5.4 L/min] 4.9 L/min CI:  [2.2 L/min/m2-2.8 L/min/m2] 2.5 L/min/m2  Intake/Output from previous day: 06/30 0701 - 07/01 0700 In: 4977.1 [I.V.:2702.8; Blood:300; IV Piggyback:1974.3] Out: 2510 [Urine:1270; Blood:600; Chest Tube:640] Intake/Output this shift: No intake/output data recorded.  General appearance: alert and mildly confused, distracted Neurologic: no focal weakness Heart: regular rate and rhythm and + systolic murmur Lungs: diminished breath sounds bibasilar Abdomen: normal findings: soft, non-tender  Lab Results: Recent Labs    09/23/20 1858 09/23/20 2002 09/23/20 2118 09/24/20 0404  WBC 16.8*  --   --  15.3*  HGB 7.2*   < > 6.5* 7.1*  HCT 20.5*   < > 19.0* 20.8*  PLT 141*  --   --  163   < > = values in this interval not displayed.   BMET:  Recent Labs    09/23/20 1858 09/23/20 2002 09/23/20 2118 09/24/20 0404  NA 128*   < > 134* 131*  K 3.8   < > 4.0 3.7  CL 97*  --   --  100  CO2 21*  --   --  25  GLUCOSE 119*  --   --  107*  BUN 34*  --   --  33*  CREATININE 0.98  --   --  1.11  CALCIUM 7.0*  --   --   7.5*   < > = values in this interval not displayed.    PT/INR:  Recent Labs    09/23/20 1330  LABPROT 20.7*  INR 1.8*   ABG    Component Value Date/Time   PHART 7.405 09/23/2020 2118   HCO3 23.1 09/23/2020 2118   TCO2 24 09/23/2020 2118   ACIDBASEDEF 2.0 09/23/2020 2118   O2SAT 96.0 09/23/2020 2118   CBG (last 3)  Recent Labs    09/24/20 0015 09/24/20 0224 09/24/20 0402  GLUCAP 114* 112* 103*    Assessment/Plan: S/P Procedure(s) (LRB): CORONARY ARTERY BYPASS GRAFTING (CABG), ON PUMP, TIMES THREE, USING LEFT INTERNAL MAMMARY ARTERY AND LEFT ENDOSCOPICALLY HARVESTED GREATER SAPHENOUS VEIN (N/A) TRANSESOPHAGEAL ECHOCARDIOGRAM (TEE) (N/A) ENDOVEIN HARVEST OF GREATER SAPHENOUS VEIN (Left) POD # 1 NEURO- recent anoxic encephalopathy following cardiac arrest  Mild confusion but easily reoriented  CV- s/p CABG.  Good hemodynamics on low dose milrinone and Epi- decrease milrinone to 0.125, dc epi ECG with flattened T waves laterally, QTc 467-postop changes  ASA, beta blocker, statin (Crestor)  RESP- extubated, good O2 sat on 2L [place]  IS for atelectasis  RENAL- creatinine normal, lytes Ok, BUN mildly elevated- watch for any signs of GI bleeding in setting of preop anemia  GI- abdomen benign- advance diet as tolerated  Anemia secondary to ABL- Hgb 7.1- transfuse 1 unit  SCD + enoxaparin for DVT prophylaxis  Dc chest tubes  Cardiac rehab  LOS: 8 days    Brian Robinson 09/24/2020

## 2020-09-25 ENCOUNTER — Inpatient Hospital Stay (HOSPITAL_COMMUNITY): Payer: Medicare Other

## 2020-09-25 LAB — HEPATIC FUNCTION PANEL
ALT: 627 U/L — ABNORMAL HIGH (ref 0–44)
AST: 1326 U/L — ABNORMAL HIGH (ref 15–41)
Albumin: 3.2 g/dL — ABNORMAL LOW (ref 3.5–5.0)
Alkaline Phosphatase: 52 U/L (ref 38–126)
Bilirubin, Direct: 2.3 mg/dL — ABNORMAL HIGH (ref 0.0–0.2)
Indirect Bilirubin: 1.3 mg/dL — ABNORMAL HIGH (ref 0.3–0.9)
Total Bilirubin: 3.6 mg/dL — ABNORMAL HIGH (ref 0.3–1.2)
Total Protein: 6 g/dL — ABNORMAL LOW (ref 6.5–8.1)

## 2020-09-25 LAB — BASIC METABOLIC PANEL
Anion gap: 11 (ref 5–15)
BUN: 43 mg/dL — ABNORMAL HIGH (ref 8–23)
CO2: 25 mmol/L (ref 22–32)
Calcium: 8.2 mg/dL — ABNORMAL LOW (ref 8.9–10.3)
Chloride: 94 mmol/L — ABNORMAL LOW (ref 98–111)
Creatinine, Ser: 1.64 mg/dL — ABNORMAL HIGH (ref 0.61–1.24)
GFR, Estimated: 43 mL/min — ABNORMAL LOW (ref >60)
Glucose, Bld: 104 mg/dL — ABNORMAL HIGH (ref 70–99)
Potassium: 4.2 mmol/L (ref 3.5–5.1)
Sodium: 130 mmol/L — ABNORMAL LOW (ref 135–145)

## 2020-09-25 LAB — CBC
HCT: 25.7 % — ABNORMAL LOW (ref 39.0–52.0)
Hemoglobin: 8.7 g/dL — ABNORMAL LOW (ref 13.0–17.0)
MCH: 29.8 pg (ref 26.0–34.0)
MCHC: 33.9 g/dL (ref 30.0–36.0)
MCV: 88 fL (ref 80.0–100.0)
Platelets: 201 10*3/uL (ref 150–400)
RBC: 2.92 MIL/uL — ABNORMAL LOW (ref 4.22–5.81)
RDW: 14.6 % (ref 11.5–15.5)
WBC: 21.1 10*3/uL — ABNORMAL HIGH (ref 4.0–10.5)
nRBC: 0.5 % — ABNORMAL HIGH (ref 0.0–0.2)

## 2020-09-25 LAB — GLUCOSE, CAPILLARY
Glucose-Capillary: 101 mg/dL — ABNORMAL HIGH (ref 70–99)
Glucose-Capillary: 81 mg/dL (ref 70–99)
Glucose-Capillary: 77 mg/dL (ref 70–99)
Glucose-Capillary: 62 mg/dL — ABNORMAL LOW (ref 70–99)
Glucose-Capillary: 78 mg/dL (ref 70–99)

## 2020-09-25 LAB — COOXEMETRY PANEL
Carboxyhemoglobin: 1.3 % (ref 0.5–1.5)
Methemoglobin: 0.8 % (ref 0.0–1.5)
O2 Saturation: 55.3 %
Total hemoglobin: 8.9 g/dL — ABNORMAL LOW (ref 12.0–16.0)

## 2020-09-25 NOTE — Progress Notes (Signed)
POC BG result 62, patient asymptomatic. Nurse provided patient with 4 oz. orange juice, and rechecked POC BG approximately 15 minutes later, result was 78.

## 2020-09-25 NOTE — Progress Notes (Signed)
      [address]       [place] [address]             [phone]                 2 Days Post-Op Procedure(s) (LRB): CORONARY ARTERY BYPASS GRAFTING (CABG), ON PUMP, TIMES THREE, USING LEFT INTERNAL MAMMARY ARTERY AND LEFT ENDOSCOPICALLY HARVESTED GREATER SAPHENOUS VEIN (N/A) TRANSESOPHAGEAL ECHOCARDIOGRAM (TEE) (N/A) ENDOVEIN HARVEST OF GREATER SAPHENOUS VEIN (Left)   Events: No events Mental status improved _______________________________________________________________ Vitals: BP 133/60 (BP Location: Left Arm)   Pulse 77   Temp 97.8 F (36.6 C) (Oral)   Resp 18   Ht 5\' 6"  (1.676 m)   Wt 93 kg   SpO2 95%   BMI 33.09 kg/m   - Neuro: alert NAD  - Cardiovascular: sinus  Drips: milr 0.125.   PAP: (57-60)/(25-29) 57/25  - Pulm: EWOB    ABG    Component Value Date/Time   PHART 7.405 09/23/2020 2118   PCO2ART 36.7 09/23/2020 2118   PO2ART 76 (L) 09/23/2020 2118   HCO3 23.1 09/23/2020 2118   TCO2 24 09/23/2020 2118   ACIDBASEDEF 2.0 09/23/2020 2118   O2SAT 55.3 09/25/2020 0400    - Abd: ND - Extremity: LLE swelling  .Intake/Output      07/01 0701 07/02 0700 07/02 0701 07/03 0700   I.V. (mL/kg) 400 (4.3) 26.1 (0.3)   Blood 360    IV Piggyback 373.8 5.5   Total Intake(mL/kg) 1133.8 (12.2) 31.6 (0.3)   Urine (mL/kg/hr) 720 (0.3) 75 (0.2)   Blood     Chest Tube 210    Total Output 930 75   Net +203.8 -43.4           _______________________________________________________________ Labs: CBC Latest Ref Rng & Units 09/25/2020 09/24/2020 09/24/2020  WBC 4.0 - 10.5 K/uL 21.1(H) 18.9(H) 15.3(H)  Hemoglobin 13.0 - 17.0 g/dL 8.7(L) 8.6(L) 7.1(L)  Hematocrit 39.0 - 52.0 % 25.7(L) 26.1(L) 20.8(L)  Platelets 150 - 400 K/uL 201 190 163   CMP Latest Ref Rng & Units 09/25/2020 09/24/2020 09/24/2020  Glucose 70 - 99 mg/dL 104(H) 131(H) 107(H)  BUN 8 - 23 mg/dL 43(H) 39(H) 33(H)  Creatinine 0.61 - 1.24 mg/dL 1.64(H) 1.48(H) 1.11  Sodium 135 - 145 mmol/L  130(L) 131(L) 131(L)  Potassium 3.5 - 5.1 mmol/L 4.2 4.6 3.7  Chloride 98 - 111 mmol/L 94(L) 97(L) 100  CO2 22 - 32 mmol/L 25 24 25   Calcium 8.9 - 10.3 mg/dL 8.2(L) 7.8(L) 7.5(L)  Total Protein 6.5 - 8.1 g/dL 6.0(L) - -  Total Bilirubin 0.3 - 1.2 mg/dL 3.6(H) - -  Alkaline Phos 38 - 126 U/L 52 - -  AST 15 - 41 U/L 1,326(H) - -  ALT 0 - 44 U/L 627(H) - -    CXR: PV congestion  _______________________________________________________________  Assessment and Plan: POD 2 s/p emergency CABG.  Doing well.  AKI.  Neuro: mental status improved.  Pain controlled CV: on milr.  Will continue given AKI.  Will remove wires Pulm: continue pulm toilet Renal: creat trending up.  Will continue to follow.  Will keep milr GI: on diet Heme: stable ID: afebrile.  ID signed off.  Stopping empiric abx Endo: SSI Dispo: continue ICU   Minoru Chap O Kiylah Loyer 09/25/2020 10:32 AM

## 2020-09-25 NOTE — Progress Notes (Signed)
Pacing wires removed at 1200, patient placed on bedrest for 1 hour q79min vitals remain stable and patient is in stable condition.  Consuella Lose, RN

## 2020-09-25 NOTE — Plan of Care (Signed)
  Problem: Clinical Measurements: Goal: Ability to maintain clinical measurements within normal limits will improve Outcome: Progressing Goal: Will remain free from infection Outcome: Progressing Goal: Diagnostic test results will improve Outcome: Progressing Goal: Respiratory complications will improve Outcome: Progressing Goal: Cardiovascular complication will be avoided Outcome: Progressing   Problem: Activity: Goal: Risk for activity intolerance will decrease Outcome: Progressing   Problem: Nutrition: Goal: Adequate nutrition will be maintained Outcome: Progressing   Problem: Coping: Goal: Level of anxiety will decrease Outcome: Progressing   Problem: Pain Managment: Goal: General experience of comfort will improve Outcome: Progressing   Problem: Safety: Goal: Ability to remain free from injury will improve Outcome: Progressing

## 2020-09-26 LAB — GLUCOSE, CAPILLARY
Glucose-Capillary: 115 mg/dL — ABNORMAL HIGH (ref 70–99)
Glucose-Capillary: 107 mg/dL — ABNORMAL HIGH (ref 70–99)
Glucose-Capillary: 73 mg/dL (ref 70–99)

## 2020-09-26 LAB — TYPE AND SCREEN
ABO/RH(D): AB NEG
Antibody Screen: NEGATIVE
Unit division: 0
Unit division: 0
Unit division: 0
Unit division: 0
Unit division: 0
Unit division: 0

## 2020-09-26 LAB — BPAM RBC
Blood Product Expiration Date: 202207082359
Blood Product Expiration Date: 202207082359
Blood Product Expiration Date: 202207132359
Blood Product Expiration Date: 202207182359
Blood Product Expiration Date: 202207262359
Blood Product Expiration Date: 202207262359
ISSUE DATE / TIME: 202206300810
ISSUE DATE / TIME: 202206300810
ISSUE DATE / TIME: 202206300810
ISSUE DATE / TIME: 202207010809
Unit Type and Rh: 600
Unit Type and Rh: 600
Unit Type and Rh: 600
Unit Type and Rh: 600
Unit Type and Rh: 600
Unit Type and Rh: 600

## 2020-09-26 LAB — COMPREHENSIVE METABOLIC PANEL
ALT: 322 U/L — ABNORMAL HIGH (ref 0–44)
AST: 669 U/L — ABNORMAL HIGH (ref 15–41)
Albumin: 3 g/dL — ABNORMAL LOW (ref 3.5–5.0)
Alkaline Phosphatase: 65 U/L (ref 38–126)
Anion gap: 10 (ref 5–15)
BUN: 44 mg/dL — ABNORMAL HIGH (ref 8–23)
CO2: 26 mmol/L (ref 22–32)
Calcium: 8.6 mg/dL — ABNORMAL LOW (ref 8.9–10.3)
Chloride: 96 mmol/L — ABNORMAL LOW (ref 98–111)
Creatinine, Ser: 1.46 mg/dL — ABNORMAL HIGH (ref 0.61–1.24)
GFR, Estimated: 50 mL/min — ABNORMAL LOW (ref >60)
Glucose, Bld: 86 mg/dL (ref 70–99)
Potassium: 5.8 mmol/L — ABNORMAL HIGH (ref 3.5–5.1)
Sodium: 132 mmol/L — ABNORMAL LOW (ref 135–145)
Total Bilirubin: 5.6 mg/dL — ABNORMAL HIGH (ref 0.3–1.2)
Total Protein: 6 g/dL — ABNORMAL LOW (ref 6.5–8.1)

## 2020-09-26 LAB — CBC
HCT: 26.4 % — ABNORMAL LOW (ref 39.0–52.0)
Hemoglobin: 9.1 g/dL — ABNORMAL LOW (ref 13.0–17.0)
MCH: 30 pg (ref 26.0–34.0)
MCHC: 34.5 g/dL (ref 30.0–36.0)
MCV: 87.1 fL (ref 80.0–100.0)
Platelets: 219 10*3/uL (ref 150–400)
RBC: 3.03 MIL/uL — ABNORMAL LOW (ref 4.22–5.81)
RDW: 14.6 % (ref 11.5–15.5)
WBC: 22.7 10*3/uL — ABNORMAL HIGH (ref 4.0–10.5)
nRBC: 0.6 % — ABNORMAL HIGH (ref 0.0–0.2)

## 2020-09-26 MED ORDER — SODIUM CHLORIDE 0.9% FLUSH: 3.0000 mL | INTRAVENOUS | Status: DC | PRN

## 2020-09-26 MED ORDER — ~~LOC~~ CARDIAC SURGERY, PATIENT & FAMILY EDUCATION: Freq: Once | Status: AC

## 2020-09-26 MED ORDER — SODIUM CHLORIDE 0.9 % IV SOLN: 250.0000 mL | INTRAVENOUS | Status: DC | PRN

## 2020-09-26 MED ORDER — SODIUM CHLORIDE 0.9% FLUSH
3.0000 mL | Freq: Two times a day (BID) | INTRAVENOUS | Status: DC
  Administered 2020-09-26 – 2020-09-27 (×3): 3 mL via INTRAVENOUS

## 2020-09-26 MED ORDER — INSULIN DETEMIR 100 UNIT/ML ~~LOC~~ SOLN
5.0000 [IU] | Freq: Two times a day (BID) | SUBCUTANEOUS | Status: DC
  Administered 2020-09-26 (×2): 5 [IU] via SUBCUTANEOUS
  Filled 2020-09-26 (×4): qty 0.05

## 2020-09-26 NOTE — Progress Notes (Signed)
Pt admitted to [facility] from [facility].  Pt is A&OX4 and neuro intact.  Vitals taken and all within normal range.  CHG bath completed.  Pt placed on telemetry and CCMD notified.  Pt is currently comfortable and not in pain.

## 2020-09-26 NOTE — Progress Notes (Addendum)
      [address]       [place] [address]             [phone]                 3 Days Post-Op Procedure(s) (LRB): CORONARY ARTERY BYPASS GRAFTING (CABG), ON PUMP, TIMES THREE, USING LEFT INTERNAL MAMMARY ARTERY AND LEFT ENDOSCOPICALLY HARVESTED GREATER SAPHENOUS VEIN (N/A) TRANSESOPHAGEAL ECHOCARDIOGRAM (TEE) (N/A) ENDOVEIN HARVEST OF GREATER SAPHENOUS VEIN (Left)   Events: No events  _______________________________________________________________ Vitals: BP 107/74 (BP Location: Left Arm)   Pulse 80   Temp 98 F (36.7 C) (Oral)   Resp 19   Ht 5\' 6"  (1.676 m)   Wt 92.5 kg   SpO2 93%   BMI 32.91 kg/m   - Neuro: alert NAD  - Cardiovascular: sinus  Drips: milr 0.125.      - Pulm: EWOB    ABG    Component Value Date/Time   PHART 7.405 09/23/2020 2118   PCO2ART 36.7 09/23/2020 2118   PO2ART 76 (L) 09/23/2020 2118   HCO3 23.1 09/23/2020 2118   TCO2 24 09/23/2020 2118   ACIDBASEDEF 2.0 09/23/2020 2118   O2SAT 55.3 09/25/2020 0400    - Abd: ND - Extremity: LLE swelling  .Intake/Output      07/02 0701 07/03 0700 07/03 0701 07/04 0700   P.O. 540    I.V. (mL/kg) 300.5 (3.2) 13.2 (0.1)   Blood     IV Piggyback 5.5    Total Intake(mL/kg) 846 (9.1) 13.2 (0.1)   Urine (mL/kg/hr) 975 (0.4)    Chest Tube     Total Output 975    Net -129 +13.2           _______________________________________________________________ Labs: CBC Latest Ref Rng & Units 09/25/2020 09/24/2020 09/24/2020  WBC 4.0 - 10.5 K/uL 21.1(H) 18.9(H) 15.3(H)  Hemoglobin 13.0 - 17.0 g/dL 8.7(L) 8.6(L) 7.1(L)  Hematocrit 39.0 - 52.0 % 25.7(L) 26.1(L) 20.8(L)  Platelets 150 - 400 K/uL 201 190 163   CMP Latest Ref Rng & Units 09/26/2020 09/25/2020 09/24/2020  Glucose 70 - 99 mg/dL 86 104(H) 131(H)  BUN 8 - 23 mg/dL 44(H) 43(H) 39(H)  Creatinine 0.61 - 1.24 mg/dL 1.46(H) 1.64(H) 1.48(H)  Sodium 135 - 145 mmol/L 132(L) 130(L) 131(L)  Potassium 3.5 - 5.1 mmol/L 5.8(H) 4.2 4.6   Chloride 98 - 111 mmol/L 96(L) 94(L) 97(L)  CO2 22 - 32 mmol/L 26 25 24   Calcium 8.9 - 10.3 mg/dL 8.6(L) 8.2(L) 7.8(L)  Total Protein 6.5 - 8.1 g/dL 6.0(L) 6.0(L) -  Total Bilirubin 0.3 - 1.2 mg/dL 5.6(H) 3.6(H) -  Alkaline Phos 38 - 126 U/L 65 52 -  AST 15 - 41 U/L 669(H) 1,326(H) -  ALT 0 - 44 U/L 322(H) 627(H) -    CXR: -  _______________________________________________________________  Assessment and Plan: POD 3 s/p emergency CABG.  Doing well.  AKI.  Neuro: mental status improved.  Pain controlled CV: will d/c milr.   Pulm: continue pulm toilet Renal: creat down trend.  Potassium up.  Will follow GI: on diet Heme: stable ID: afebrile.  ID signed off.  Stopping empiric abx Endo: SSI Dispo: floor today   Lajuana Matte 09/26/2020 9:55 AM

## 2020-09-26 NOTE — Progress Notes (Signed)
Assisted tele visit to patient with family member.  Daesia Zylka Ann, RN  

## 2020-09-26 NOTE — Progress Notes (Signed)
EKG CRITICAL VALUE     12 lead EKG performed.  Critical value noted.  Donnetta Simpers, RN notified.   Terron Merfeld, CCT 09/26/2020 11:21 AM

## 2020-09-27 DIAGNOSIS — G931 Anoxic brain damage, not elsewhere classified: Secondary | ICD-10-CM

## 2020-09-27 DIAGNOSIS — R748 Abnormal levels of other serum enzymes: Secondary | ICD-10-CM

## 2020-09-27 LAB — COMPREHENSIVE METABOLIC PANEL
ALT: 163 U/L — ABNORMAL HIGH (ref 0–44)
AST: 385 U/L — ABNORMAL HIGH (ref 15–41)
Albumin: 2.9 g/dL — ABNORMAL LOW (ref 3.5–5.0)
Alkaline Phosphatase: 96 U/L (ref 38–126)
Anion gap: 9 (ref 5–15)
BUN: 34 mg/dL — ABNORMAL HIGH (ref 8–23)
CO2: 24 mmol/L (ref 22–32)
Calcium: 8.1 mg/dL — ABNORMAL LOW (ref 8.9–10.3)
Chloride: 97 mmol/L — ABNORMAL LOW (ref 98–111)
Creatinine, Ser: 1.18 mg/dL (ref 0.61–1.24)
GFR, Estimated: >60 mL/min (ref >60)
Glucose, Bld: 63 mg/dL — ABNORMAL LOW (ref 70–99)
Potassium: 3.7 mmol/L (ref 3.5–5.1)
Sodium: 130 mmol/L — ABNORMAL LOW (ref 135–145)
Total Bilirubin: 5 mg/dL — ABNORMAL HIGH (ref 0.3–1.2)
Total Protein: 6.3 g/dL — ABNORMAL LOW (ref 6.5–8.1)

## 2020-09-27 LAB — HEPATIC FUNCTION PANEL
ALT: 140 U/L — ABNORMAL HIGH (ref 0–44)
AST: 327 U/L — ABNORMAL HIGH (ref 15–41)
Albumin: 2.9 g/dL — ABNORMAL LOW (ref 3.5–5.0)
Alkaline Phosphatase: 92 U/L (ref 38–126)
Bilirubin, Direct: 3.2 mg/dL — ABNORMAL HIGH (ref 0.0–0.2)
Indirect Bilirubin: 2.4 mg/dL — ABNORMAL HIGH (ref 0.3–0.9)
Total Bilirubin: 5.6 mg/dL — ABNORMAL HIGH (ref 0.3–1.2)
Total Protein: 6.5 g/dL (ref 6.5–8.1)

## 2020-09-27 LAB — CBC
HCT: 28.9 % — ABNORMAL LOW (ref 39.0–52.0)
Hemoglobin: 9.5 g/dL — ABNORMAL LOW (ref 13.0–17.0)
MCH: 29.6 pg (ref 26.0–34.0)
MCHC: 32.9 g/dL (ref 30.0–36.0)
MCV: 90 fL (ref 80.0–100.0)
Platelets: 212 10*3/uL (ref 150–400)
RBC: 3.21 MIL/uL — ABNORMAL LOW (ref 4.22–5.81)
RDW: 15.2 % (ref 11.5–15.5)
WBC: 18.8 10*3/uL — ABNORMAL HIGH (ref 4.0–10.5)
nRBC: 0.4 % — ABNORMAL HIGH (ref 0.0–0.2)

## 2020-09-27 LAB — CULTURE, BLOOD (ROUTINE X 2)
Culture: NO GROWTH
Culture: NO GROWTH
Special Requests: ADEQUATE

## 2020-09-27 LAB — GLUCOSE, CAPILLARY
Glucose-Capillary: 70 mg/dL (ref 70–99)
Glucose-Capillary: 99 mg/dL (ref 70–99)
Glucose-Capillary: 115 mg/dL — ABNORMAL HIGH (ref 70–99)
Glucose-Capillary: 160 mg/dL — ABNORMAL HIGH (ref 70–99)

## 2020-09-27 MED ORDER — FUROSEMIDE 40 MG PO TABS
40.0000 mg | ORAL_TABLET | Freq: Every day | ORAL | Status: DC
  Administered 2020-09-27 – 2020-09-28 (×2): 40 mg via ORAL
  Filled 2020-09-27 (×2): qty 1

## 2020-09-27 MED ORDER — LOSARTAN POTASSIUM 25 MG PO TABS
25.0000 mg | ORAL_TABLET | Freq: Every day | ORAL | Status: DC
  Administered 2020-09-27 – 2020-09-28 (×2): 25 mg via ORAL
  Filled 2020-09-27 (×2): qty 1

## 2020-09-27 MED ORDER — POTASSIUM CHLORIDE CRYS ER 10 MEQ PO TBCR
10.0000 meq | EXTENDED_RELEASE_TABLET | Freq: Every day | ORAL | Status: DC
  Administered 2020-09-27: 10 meq via ORAL
  Filled 2020-09-27: qty 1

## 2020-09-27 NOTE — Progress Notes (Signed)
Progress Note  Patient Name: Elridge Stemm Date of Encounter: 09/27/2020  Primary Cardiologist: none  Subjective   No chest pain  Inpatient Medications    Scheduled Meds:  acetaminophen  1,000 mg Oral Q6H   Or   acetaminophen (TYLENOL) oral liquid 160 mg/5 mL  1,000 mg Per Tube Q6H   aspirin EC  325 mg Oral Daily   Or   aspirin  324 mg Per Tube Daily   bisacodyl  10 mg Oral Daily   Or   bisacodyl  10 mg Rectal Daily   Chlorhexidine Gluconate Cloth  6 each Topical Daily   docusate sodium  200 mg Oral Daily   enoxaparin (LOVENOX) injection  40 mg Subcutaneous QHS   insulin aspart  0-24 Units Subcutaneous TID AC & HS   insulin detemir  5 Units Subcutaneous BID   metoprolol tartrate  12.5 mg Oral BID   Or   metoprolol tartrate  12.5 mg Per Tube BID   pantoprazole  40 mg Oral Daily   rosuvastatin  20 mg Oral Daily   sertraline  50 mg Oral Daily   sodium chloride flush  3 mL Intravenous Q12H   sodium chloride flush  3 mL Intravenous Q12H   Continuous Infusions:  sodium chloride Stopped (09/24/20 1139)   sodium chloride     sodium chloride     sodium chloride     insulin Stopped (09/24/20 1040)   lactated ringers     lactated ringers Stopped (09/23/20 1353)   lactated ringers Stopped (09/26/20 1000)   PRN Meds: sodium chloride, sodium chloride, dextrose, diphenhydrAMINE, lactated ringers, metoprolol tartrate, midazolam, morphine injection, ondansetron (ZOFRAN) IV, oxyCODONE, sodium chloride flush, sodium chloride flush, traMADol   Vital Signs    Vitals:   09/26/20 2044 09/26/20 2341 09/27/20 0511 09/27/20 0733  BP: (!) 142/86 135/79 (!) 146/79 (!) 151/87  Pulse: 79 73 80 84  Resp: 18 18 20 19   Temp: 98.4 F (36.9 C) 98.4 F (36.9 C) 98.3 F (36.8 C) 97.8 F (36.6 C)  TempSrc: Oral Oral Oral Oral  SpO2: 94% 97% 99% 91%  Weight:   96.6 kg   Height:        Intake/Output Summary (Last 24 hours) at 09/27/2020 0845 Last data filed at 09/26/2020 1908 Gross per  24 hour  Intake 266.47 ml  Output 300 ml  Net -33.53 ml    I/O since admission:   Filed Weights   09/25/20 0629 09/26/20 0500 09/27/20 0511  Weight: 93 kg 92.5 kg 96.6 kg    Telemetry    Sinus in the 80s- Personally Reviewed  ECG    ECG (independently read by me):  Physical Exam    BP (!) 151/87 (BP Location: Right Arm)   Pulse 84   Temp 97.8 F (36.6 C) (Oral)   Resp 19   Ht 5\' 6"  (1.676 m)   Wt 96.6 kg   SpO2 91%   BMI 34.37 kg/m  General: Alert, oriented, no distress.  Skin: normal turgor, no rashes, warm and dry HEENT: Normocephalic, atraumatic. Pupils equal round and reactive to light; sclera anicteric; extraocular muscles intact; Nose without nasal septal hypertrophy Mouth/Parynx benign;  Neck: No JVD, no carotid bruits; normal carotid upstroke Lungs: clear to ausculatation and percussion; no wheezing or rales Chest wall: sternotomy sit stable Heart: PMI not displaced, RRR, s1 s2 normal, 1/6 systolic murmur, no diastolic murmur, no rubs, gallops, thrills, or heaves Abdomen: soft, nontender; no hepatosplenomehaly, BS+; abdominal aorta nontender  and not dilated by palpation. Back: no CVA tenderness Pulses 2+ Musculoskeletal: full range of motion, normal strength, no joint deformities Extremities: 2+ LLE edema; trace-1+ RL; no clubbing cyanosis  Homan's sign negative  Neurologic: grossly nonfocal; Cranial nerves grossly wnl Psychologic: Normal mood and affect    Labs    Chemistry Recent Labs  Lab 09/25/20 0338 09/26/20 0144 09/27/20 0103  NA 130* 132* 130*  K 4.2 5.8* 3.7  CL 94* 96* 97*  CO2 25 26 24   GLUCOSE 104* 86 63*  BUN 43* 44* 34*  CREATININE 1.64* 1.46* 1.18  CALCIUM 8.2* 8.6* 8.1*  PROT 6.0* 6.0* 6.3*  ALBUMIN 3.2* 3.0* 2.9*  AST 1,326* 669* 385*  ALT 627* 322* 163*  ALKPHOS 52 65 96  BILITOT 3.6* 5.6* 5.0*  GFRNONAA 43* 50* >60  ANIONGAP 11 10 9      Hematology Recent Labs  Lab 09/25/20 0338 09/26/20 1012 09/27/20 0103   WBC 21.1* 22.7* 18.8*  RBC 2.92* 3.03* 3.21*  HGB 8.7* 9.1* 9.5*  HCT 25.7* 26.4* 28.9*  MCV 88.0 87.1 90.0  MCH 29.8 30.0 29.6  MCHC 33.9 34.5 32.9  RDW 14.6 14.6 15.2  PLT 201 219 212      Lab 09/16/20 1726 09/16/20 1927 09/17/20 0829 09/17/20 1059  TROPONINIHS 155* 1,177* 831* 665*       Cardiac EnzymesNo results for input(s): TROPONINI in the last 168 hours. No results for input(s): TROPIPOC in the last 168 hours.   BNPNo results for input(s): BNP, PROBNP in the last 168 hours.   DDimer No results for input(s): DDIMER in the last 168 hours.   Lipid Panel     Component Value Date/Time   CHOL 141 09/18/2020 0023   TRIG 96 09/18/2020 0023   HDL 40 (L) 09/18/2020 0023   CHOLHDL 3.5 09/18/2020 0023   VLDL 19 09/18/2020 0023   LDLCALC 82 09/18/2020 0023     Radiology    No results found.  Cardiac Studies   CATH: 09/20/2020 Conclusions: Severe three-vessel coronary artery disease, including chronic total occlusion of proximal LAD with left-to-left collaterals, 75% ostial/proximal ramus intermedius stenosis, and sequential 75% ostial and 90% distal RCA lesions.  LCx and OM3 have mild to moderate disease of up to 40%. Moderately elevated left ventricular filling pressure (LVEDP ~30 mmHg). Mild aortic stenosis (peak-to-peak gradient 10-15 mmHg.   Recommendations: Imaged reviewed with Dr. Burt Knack during the procedure.  We will plan for cardiac surgery consultation for CABG given severe LAD and RCA disease with good distal targets in both vessels. Defer reinitiation of IV heparin, as the coronary artery disease appears chronic and hemoglobin has trended down significantly since admission. Aggressive secondary prevention. Diurese with furosemide 40 mg IV x 1.  Further diureses to be based on urine output, volume status, and renal function.      Intervention   Patient Profile     75 y.o. male s/p out of hospital cardiac arrest  Assessment & Plan    NSTEMI with  multivessel CAD: 09/16/2020 with VT/VF cardiac arrest successfully defibrillated by EMS with ROSC. Day 4 s/p CABG with LIMA -LAD, SVG - Diagonal , and SVG -PDA.  Intro-operative EF 35 - 40% range Elevated LFTs, s/p arrest, slowly improving; hold statin until improves then resume; will recheck in am Post -op anemia LE edema; consider low dose lasix diuresis   Will need life-vest at dc with reassessment of LV fxn 2-3 month post revasculaization. Signed,  Troy Sine, MD, [facility] 09/27/2020,  8:45 AM

## 2020-09-27 NOTE — Progress Notes (Addendum)
      [address]       [place] [address]             [phone]       4 Days Post-Op Procedure(s) (LRB): CORONARY ARTERY BYPASS GRAFTING (CABG), ON PUMP, TIMES THREE, USING LEFT INTERNAL MAMMARY ARTERY AND LEFT ENDOSCOPICALLY HARVESTED GREATER SAPHENOUS VEIN (N/A) TRANSESOPHAGEAL ECHOCARDIOGRAM (TEE) (N/A) ENDOVEIN HARVEST OF GREATER SAPHENOUS VEIN (Left)  Subjective:  Patient wants to go home.  No new complaints.  Wife and son at bedside.. concerned about bruising along back.  No BM yet  Objective: Vital signs in last 24 hours: Temp:  [97.8 F (36.6 C)-99.2 F (37.3 C)] 97.8 F (36.6 C) (07/04 0733) Pulse Rate:  [73-89] 84 (07/04 0733) Cardiac Rhythm: Normal sinus rhythm (07/03 2041) Resp:  [14-20] 19 (07/04 0733) BP: (119-151)/(62-88) 151/87 (07/04 0733) SpO2:  [91 %-99 %] 91 % (07/04 0733) FiO2 (%):  [21 %] 21 % (07/03 1000) Weight:  [96.6 kg] 96.6 kg (07/04 0511)  Intake/Output from previous day: 07/03 0701 - 07/04 0700 In: 279.7 [P.O.:240; I.V.:39.7] Out: 300 [Urine:300]  General appearance: alert, cooperative, and no distress Heart: regular rate and rhythm Lungs: clear to auscultation bilaterally Abdomen: soft, non-tender; bowel sounds normal; no masses,  no organomegaly and ecchymosis left flank and around on back Extremities: left LE ecchymotic from [facility] harvest + BLE edema Wound: clean and dry  Lab Results: Recent Labs    09/26/20 1012 09/27/20 0103  WBC 22.7* 18.8*  HGB 9.1* 9.5*  HCT 26.4* 28.9*  PLT 219 212   BMET:  Recent Labs    09/26/20 0144 09/27/20 0103  NA 132* 130*  K 5.8* 3.7  CL 96* 97*  CO2 26 24  GLUCOSE 86 63*  BUN 44* 34*  CREATININE 1.46* 1.18  CALCIUM 8.6* 8.1*    PT/INR: No results for input(s): LABPROT, INR in the last 72 hours. ABG    Component Value Date/Time   PHART 7.405 09/23/2020 2118   HCO3 23.1 09/23/2020 2118   TCO2 24 09/23/2020 2118   ACIDBASEDEF 2.0 09/23/2020 2118   O2SAT 55.3  09/25/2020 0400   CBG (last 3)  Recent Labs    09/26/20 1551 09/26/20 2042 09/27/20 0616  GLUCAP 107* 73 70    Assessment/Plan: S/P Procedure(s) (LRB): CORONARY ARTERY BYPASS GRAFTING (CABG), ON PUMP, TIMES THREE, USING LEFT INTERNAL MAMMARY ARTERY AND LEFT ENDOSCOPICALLY HARVESTED GREATER SAPHENOUS VEIN (N/A) TRANSESOPHAGEAL ECHOCARDIOGRAM (TEE) (N/A) ENDOVEIN HARVEST OF GREATER SAPHENOUS VEIN (Left)  CV- NSR, + HTN- will start Lopressor 12.5 mg BID Pulm- off oxygen, no acute issues, continue IS Renal- creatinine improved down to 1.18, patient with significant LE edema, will start Lasix 40 mg daily, supplement K, repeat BMET in AM Elevated LFT- likely due to shock liver from arrest on presentation, continue to hold statin Expected post operative blood loss anemia, hgb up to 9.5, improving DM- sugars are low in the 70-100, will stop Levemir, continue SSIP Dispo- patient stable, HTN will start low dose BB, start lasix for LE edema, monitor creatinine closely, Hgb improving, good glucose control, patient making progress, maybe ready for d/c home soon.. will need LifeVest at d/c per Cards   LOS: 11 days  Ellwood Handler, PA-C 09/27/2020  Agree with above. Titrating blood pressure medications. Starting diuresis today given kidney function is improved. Holding statins due to elevated liver function tests.  Thoren Hosang Bary Leriche

## 2020-09-28 DIAGNOSIS — Z951 Presence of aortocoronary bypass graft: Secondary | ICD-10-CM

## 2020-09-28 LAB — COMPREHENSIVE METABOLIC PANEL
ALT: 128 U/L — ABNORMAL HIGH (ref 0–44)
AST: 248 U/L — ABNORMAL HIGH (ref 15–41)
Albumin: 3 g/dL — ABNORMAL LOW (ref 3.5–5.0)
Alkaline Phosphatase: 101 U/L (ref 38–126)
Anion gap: 11 (ref 5–15)
BUN: 23 mg/dL (ref 8–23)
CO2: 24 mmol/L (ref 22–32)
Calcium: 8.4 mg/dL — ABNORMAL LOW (ref 8.9–10.3)
Chloride: 95 mmol/L — ABNORMAL LOW (ref 98–111)
Creatinine, Ser: 1.2 mg/dL (ref 0.61–1.24)
GFR, Estimated: >60 mL/min (ref >60)
Glucose, Bld: 110 mg/dL — ABNORMAL HIGH (ref 70–99)
Potassium: 3.6 mmol/L (ref 3.5–5.1)
Sodium: 130 mmol/L — ABNORMAL LOW (ref 135–145)
Total Bilirubin: 4.5 mg/dL — ABNORMAL HIGH (ref 0.3–1.2)
Total Protein: 6.6 g/dL (ref 6.5–8.1)

## 2020-09-28 LAB — CBC
HCT: 30.1 % — ABNORMAL LOW (ref 39.0–52.0)
Hemoglobin: 10.3 g/dL — ABNORMAL LOW (ref 13.0–17.0)
MCH: 30.3 pg (ref 26.0–34.0)
MCHC: 34.2 g/dL (ref 30.0–36.0)
MCV: 88.5 fL (ref 80.0–100.0)
Platelets: 244 10*3/uL (ref 150–400)
RBC: 3.4 MIL/uL — ABNORMAL LOW (ref 4.22–5.81)
RDW: 15.6 % — ABNORMAL HIGH (ref 11.5–15.5)
WBC: 16.8 10*3/uL — ABNORMAL HIGH (ref 4.0–10.5)
nRBC: 0.2 % (ref 0.0–0.2)

## 2020-09-28 LAB — GLUCOSE, CAPILLARY
Glucose-Capillary: 133 mg/dL — ABNORMAL HIGH (ref 70–99)
Glucose-Capillary: 101 mg/dL — ABNORMAL HIGH (ref 70–99)
Glucose-Capillary: 124 mg/dL — ABNORMAL HIGH (ref 70–99)

## 2020-09-28 MED ORDER — METOPROLOL TARTRATE 25 MG/10 ML ORAL SUSPENSION
25.0000 mg | Freq: Two times a day (BID) | ORAL | Status: DC
  Filled 2020-09-28: qty 10

## 2020-09-28 MED ORDER — POTASSIUM CHLORIDE CRYS ER 20 MEQ PO TBCR: 20.0000 meq | EXTENDED_RELEASE_TABLET | Freq: Every day | ORAL | 0 refills | Status: DC

## 2020-09-28 MED ORDER — METOPROLOL TARTRATE 25 MG PO TABS: 25.0000 mg | ORAL_TABLET | Freq: Two times a day (BID) | ORAL | Status: DC

## 2020-09-28 MED ORDER — ATENOLOL 50 MG PO TABS: 50.0000 mg | ORAL_TABLET | Freq: Every day | ORAL | 1 refills | Status: DC

## 2020-09-28 MED ORDER — POTASSIUM CHLORIDE CRYS ER 20 MEQ PO TBCR
30.0000 meq | EXTENDED_RELEASE_TABLET | Freq: Two times a day (BID) | ORAL | Status: DC
  Administered 2020-09-28: 30 meq via ORAL
  Filled 2020-09-28: qty 1

## 2020-09-28 MED ORDER — FUROSEMIDE 40 MG PO TABS: 40.0000 mg | ORAL_TABLET | Freq: Every day | ORAL | 0 refills | Status: DC

## 2020-09-28 MED ORDER — POTASSIUM CHLORIDE CRYS ER 20 MEQ PO TBCR: 20.0000 meq | EXTENDED_RELEASE_TABLET | Freq: Every day | ORAL | Status: DC

## 2020-09-28 MED ORDER — ASPIRIN 325 MG PO TBEC: 325.0000 mg | DELAYED_RELEASE_TABLET | Freq: Every day | ORAL | 0 refills | Status: DC

## 2020-09-28 MED ORDER — ATENOLOL 25 MG PO TABS
50.0000 mg | ORAL_TABLET | Freq: Every day | ORAL | Status: DC
  Administered 2020-09-28: 50 mg via ORAL
  Filled 2020-09-28: qty 2

## 2020-09-28 MED ORDER — ACETAMINOPHEN 500 MG PO TABS: 1000.0000 mg | ORAL_TABLET | Freq: Four times a day (QID) | ORAL | 0 refills | Status: AC | PRN

## 2020-09-28 MED ORDER — TRAMADOL HCL 50 MG PO TABS: 50.0000 mg | ORAL_TABLET | Freq: Four times a day (QID) | ORAL | 0 refills | Status: DC | PRN

## 2020-09-28 MED ORDER — LOSARTAN POTASSIUM 25 MG PO TABS: 25.0000 mg | ORAL_TABLET | Freq: Every day | ORAL | 3 refills | Status: DC

## 2020-09-28 NOTE — Care Management Important Message (Signed)
Important Message  Patient Details  Name: Brian Robinson MRN: [id number] Date of Birth: [birth year]   Medicare Important Message Given:  Yes     [name] 09/28/2020, 3:26 PM

## 2020-09-28 NOTE — Progress Notes (Addendum)
      [address]       [place] [address]             [phone]        5 Days Post-Op Procedure(s) (LRB): CORONARY ARTERY BYPASS GRAFTING (CABG), ON PUMP, TIMES THREE, USING LEFT INTERNAL MAMMARY ARTERY AND LEFT ENDOSCOPICALLY HARVESTED GREATER SAPHENOUS VEIN (N/A) TRANSESOPHAGEAL ECHOCARDIOGRAM (TEE) (N/A) ENDOVEIN HARVEST OF GREATER SAPHENOUS VEIN (Left)  Subjective: Patient had a bowel movement. He has no specific complaint this am. He wants to go home.  Objective: Vital signs in last 24 hours: Temp:  [97.8 F (36.6 C)-98.9 F (37.2 C)] 98.5 F (36.9 C) (07/05 0423) Pulse Rate:  [77-87] 79 (07/05 0423) Cardiac Rhythm: Ventricular tachycardia (07/04 1904) Resp:  [17-20] 20 (07/05 0423) BP: (134-164)/(82-89) 164/82 (07/05 0423) SpO2:  [91 %-100 %] 98 % (07/05 0423) Weight:  [88.1 kg] 88.1 kg (07/05 0618) He is in SR, had brief episode of NSVT yesterday evening  Pre op weight  86.3 kg Current Weight  09/28/20 88.1 kg      Intake/Output from previous day: 07/04 0701 - 07/05 0700 In: -  Out: 2400 [Urine:2400]   Physical Exam:  Cardiovascular: RRR Pulmonary: Slightly diminished bibasilar breath sounds Abdomen: Soft, non tender, bowel sounds present. Extremities: Mild bilateral lower extremity edema. Bruise right posterior shoulder and both hip areas Wounds: Clean and dry.  No erythema or signs of infection.  Lab Results: CBC: Recent Labs    09/27/20 0103 09/28/20 0108  WBC 18.8* 16.8*  HGB 9.5* 10.3*  HCT 28.9* 30.1*  PLT 212 244   BMET:  Recent Labs    09/27/20 0103 09/28/20 0108  NA 130* 130*  K 3.7 3.6  CL 97* 95*  CO2 24 24  GLUCOSE 63* 110*  BUN 34* 23  CREATININE 1.18 1.20  CALCIUM 8.1* 8.4*    PT/INR:  Lab Results  Component Value Date   INR 1.8 (H) 09/23/2020   INR 1.0 09/16/2020   ABG:  INR: Will add last result for INR, ABG once components are confirmed Will add last 4 CBG results once components are  confirmed  Assessment/Plan:  1. CV - S/p NSTEMI. Cardiology to arrange life vest at discharge.SR with HR in the 70-80's and hypertensive this am. More hypertensive. On Lopressor 12.5 mg bid and Losartan 25 mg daily. Per Dr. Roxan Hockey, restart Atenolol. 2.  Pulmonary - On room air. Encourage incentive spirometer 3. Volume Overload - On Lasix 40 mg daily. Will continue daily Lasix and then consider restarting Chlorthalidone as outpatient 4.  Expected post op acute blood loss anemia - H and H this am stable at 10.3 and 30.1 5. Elevated transaminases-likely secondary to shock upon admission. AST decreased to 248 and ALT decreased to 128. Consider restarting statin once normalized as outpatient 6. DM- CBGs 115/160/133. Pre op HGA1C 5.4. On Insulin. Will restart Metformin at discharge 7. Hyponatremia-has had post op. Sodium remains 130 8. Supplement potassium 9. As discussed with Dr. Roxan Hockey, will discharge later today  Donielle M ZimmermanPA-C 09/28/2020,6:57 AM   Patient seen and examined, agree with above Renal function stable WBC elevated but trending down BP still elevated- dc metoprolol and resume home dose of Tenoretic Plan to dc after Cardiology arranges life vest  Remo Lipps C. Roxan Hockey, MD [facility] [phone]

## 2020-09-28 NOTE — TOC Transition Note (Signed)
Transition of Care (TOC) - CM/SW Discharge Note Marvetta Gibbons RN, BSN Transitions of Care Unit 4E- RN Case Manager See Treatment Team for direct phone #    Patient Details  Name: Brian Robinson MRN: [id number] Date of Birth: Feb 18, 1946  Transition of Care [facility]) CM/SW Contact:  Dawayne Patricia, RN Phone Number: 09/28/2020, 11:35 AM   Clinical Narrative:    Pt stable for transition home today with wife, pt will need home Live Vest and order has been placed.  Dorian Pod with [facility] aware and will follow for Life Vest approval and fitting needs- per Dorian Pod will plan to fit pt at bedside later this afternoon. Life Vest order and support documents have been faxed in to [facility] for Life Vest needs.   CM spoke with pt and wife at bedside for transition needs HH and DME- orders placed for HHPT and DME- RW/3n1. Discussed HH and DME, list provided for [facility] choice Per CMS guidelines from medicare.gov website with star ratings (copy placed in shadow chart) , after review they have selected [facility] for [facility] needs. They are agreeable to use in house provider for DME needs and have delivered to room.   Address, phone # and PCP all confirmed in epic, wife's cell also confirmed and requested to be used as secondary phone #.   Call made to [facility] for for DME request- RW and 3n1 to be delivered to room prior to discharge.   Call made to [facility] with [facility] for [place] referral - referral has been accepted.    Final next level of care: [place] Barriers to Discharge: No Barriers Identified   Patient Goals and CMS Choice Patient states their goals for this hospitalization and ongoing recovery are:: return home CMS Medicare.gov Compare Post Acute Care list provided to:: Patient Choice offered to / list presented to : Patient, Spouse  Discharge Placement               Home with [facility]        Discharge Plan and Services   Discharge Planning Services: CM Consult Post Acute Care Choice:  Durable Medical Equipment, Home Health          DME Arranged: 3-N-1, Life vest, Walker rolling DME Agency: Maryruth Hancock Date DME Agency Contacted: 09/28/20 Time DME Agency Contacted: 0938 Representative spoke with at DME Agency: Freda Munro and Dorian Pod (Life Vest) [place] Arranged: PT HH Agency: [place] Date [place]: 09/28/20 Time [place]: 1134 Representative spoke with at [place]: [place] ([place]) Interventions     Readmission Risk Interventions Readmission Risk Prevention Plan 09/28/2020  Post Dischage Appt Complete  Medication Screening Complete  Transportation Screening Complete  Some recent data might be hidden

## 2020-09-28 NOTE — Progress Notes (Addendum)
Progress Note  Patient Name: Brian Robinson Date of Encounter: 09/28/2020  [facility] Cardiologist: Shelva Majestic, MD   Subjective   Feeling well, planned for discharge today.  Inpatient Medications    Scheduled Meds:  acetaminophen  1,000 mg Oral Q6H   Or   acetaminophen (TYLENOL) oral liquid 160 mg/5 mL  1,000 mg Per Tube Q6H   aspirin EC  325 mg Oral Daily   Or   aspirin  324 mg Per Tube Daily   atenolol  50 mg Oral Daily   bisacodyl  10 mg Oral Daily   Or   bisacodyl  10 mg Rectal Daily   docusate sodium  200 mg Oral Daily   furosemide  40 mg Oral Daily   insulin aspart  0-24 Units Subcutaneous TID AC & HS   losartan  25 mg Oral Daily   pantoprazole  40 mg Oral Daily   potassium chloride  30 mEq Oral BID   [START ON 09/29/2020] potassium chloride  20 mEq Oral Daily   sertraline  50 mg Oral Daily   Continuous Infusions:  PRN Meds: dextrose, diphenhydrAMINE, metoprolol tartrate, ondansetron (ZOFRAN) IV, oxyCODONE, traMADol   Vital Signs    Vitals:   09/27/20 2300 09/28/20 0423 09/28/20 0618 09/28/20 0817  BP: (!) 152/82 (!) 164/82  125/79  Pulse: 77 79  87  Resp: 20 20  17   Temp: 98.9 F (37.2 C) 98.5 F (36.9 C)  97.8 F (36.6 C)  TempSrc: Oral Oral  Oral  SpO2: 98% 98%  100%  Weight:   88.1 kg   Height:        Intake/Output Summary (Last 24 hours) at 09/28/2020 0931 Last data filed at 09/28/2020 0820 Gross per 24 hour  Intake 180 ml  Output 2400 ml  Net -2220 ml   Last 3 Weights 09/28/2020 09/27/2020 09/26/2020  Weight (lbs) 194 lb 3.6 oz 212 lb 15.4 oz 203 lb 14.8 oz  Weight (kg) 88.1 kg 96.6 kg 92.5 kg      Telemetry    SR with PVCs - Personally Reviewed  ECG    No new tracing this morning  Physical Exam   GEN: No acute distress.   Neck: No JVD Cardiac: RRR, no murmurs, rubs, or gallops.  Respiratory: Clear to auscultation bilaterally. GI: Soft, nontender, non-distended  MS: No edema; No deformity. Neuro:  Nonfocal  Psych: Normal  affect   Labs    High Sensitivity Troponin:   Recent Labs  Lab 09/16/20 1726 09/16/20 1927 09/17/20 0829 09/17/20 1059  TROPONINIHS 155* 1,177* 831* 665*      Chemistry Recent Labs  Lab 09/26/20 0144 09/27/20 0103 09/27/20 1013 09/28/20 0108  NA 132* 130*  --  130*  K 5.8* 3.7  --  3.6  CL 96* 97*  --  95*  CO2 26 24  --  24  GLUCOSE 86 63*  --  110*  BUN 44* 34*  --  23  CREATININE 1.46* 1.18  --  1.20  CALCIUM 8.6* 8.1*  --  8.4*  PROT 6.0* 6.3* 6.5 6.6  ALBUMIN 3.0* 2.9* 2.9* 3.0*  AST 669* 385* 327* 248*  ALT 322* 163* 140* 128*  ALKPHOS 65 96 92 101  BILITOT 5.6* 5.0* 5.6* 4.5*  GFRNONAA 50* >60  --  >60  ANIONGAP 10 9  --  11     Hematology Recent Labs  Lab 09/26/20 1012 09/27/20 0103 09/28/20 0108  WBC 22.7* 18.8* 16.8*  RBC 3.03*  3.21* 3.40*  HGB 9.1* 9.5* 10.3*  HCT 26.4* 28.9* 30.1*  MCV 87.1 90.0 88.5  MCH 30.0 29.6 30.3  MCHC 34.5 32.9 34.2  RDW 14.6 15.2 15.6*  PLT 219 212 244    BNPNo results for input(s): BNP, PROBNP in the last 168 hours.   DDimer No results for input(s): DDIMER in the last 168 hours.   Radiology    No results found.  Cardiac Studies   CATH: 09/20/2020 Conclusions: Severe three-vessel coronary artery disease, including chronic total occlusion of proximal LAD with left-to-left collaterals, 75% ostial/proximal ramus intermedius stenosis, and sequential 75% ostial and 90% distal RCA lesions.  LCx and OM3 have mild to moderate disease of up to 40%. Moderately elevated left ventricular filling pressure (LVEDP ~30 mmHg). Mild aortic stenosis (peak-to-peak gradient 10-15 mmHg.   Recommendations: Imaged reviewed with Dr. Burt Knack during the procedure.  We will plan for cardiac surgery consultation for CABG given severe LAD and RCA disease with good distal targets in both vessels. Defer reinitiation of IV heparin, as the coronary artery disease appears chronic and hemoglobin has trended down significantly since  admission. Aggressive secondary prevention. Diurese with furosemide 40 mg IV x 1.  Further diureses to be based on urine output, volume status, and renal function.         Patient Profile     75 y.o. male with PMH of HTN, DM who presented with cardiac arrest and NSTEMI. Now s/p CABG.   Assessment & Plan    NSTEMI: multivessel CAD s/p CABG. Planned for discharge today. Treated with ASA, BB and ARB. Consider addition of plavix as outpatient given NSTEMI presentation.   VT/VF arrest: shocked x1 via EMS. Intra-op TEE with EF noted at 35-40%.  -- planned for GDMT, and lifevest at discharge. I have placed orders and notified rep. Hopefully able to fit later today. -- follow up echo in 3 months -- on atenolol and losartan  HTN: blood pressures have been elevated. Resumed on atenolol this morning -- consider transition to coreg as an outpatient -- on losartan--> may be a good Entresto candidate if blood pressures remain elevated  DM: on metformin PTA -- would consider SGLT2 at follow up visit pending cost  HLD: LFTs have been elevated in the setting of cardiac arrest, but improving.  -- will need to start high dose statin at follow up if LFTs improved  CARDIOLOGY RECOMMENDATIONS:  Discharge is anticipated in the next 48 hours. Recommendations for medications and follow up:  Discharge Medications: Continue medications as they are currently listed in the [facility]. Exceptions noted above  Follow Up: The patient's Primary Cardiologist is Shelva Majestic, MD  Follow up in the office in 2 week(s).  Signed,  Reino Bellis, NP  9:31 AM 09/28/2020  [facility]   I have personally seen and examined this patient. I agree with the assessment and plan as outlined above.  He is doing well today. Discharge planned for later today. 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 Follow up with cardiology will be arranged.   Lauree Chandler 09/28/2020 12:42 PM

## 2020-09-28 NOTE — Progress Notes (Signed)
CARDIAC REHAB PHASE I   D/c education completed with pt and family. Pt educated on importance of site care and monitoring incisions daily. Encouraged continued IS use, walks, and sternal precautions. Pt given in-the-tube sheet along with heart healthy diet. Reviewed restrictions and exercise guidelines. CM made aware of DME needs. Pt denies further questions or concerns at this time. Will refer to CRP II [place].   [id number] Rufina Falco, RN BSN 09/28/2020 9:58 AM

## 2020-09-29 LAB — GLUCOSE, CAPILLARY
Glucose-Capillary: 113 mg/dL — ABNORMAL HIGH (ref 70–99)
Glucose-Capillary: 65 mg/dL — ABNORMAL LOW (ref 70–99)
Glucose-Capillary: 92 mg/dL (ref 70–99)

## 2020-09-30 DIAGNOSIS — I2510 Atherosclerotic heart disease of native coronary artery without angina pectoris: Secondary | ICD-10-CM | POA: Diagnosis not present

## 2020-09-30 DIAGNOSIS — I350 Nonrheumatic aortic (valve) stenosis: Secondary | ICD-10-CM | POA: Diagnosis not present

## 2020-09-30 DIAGNOSIS — J9601 Acute respiratory failure with hypoxia: Secondary | ICD-10-CM | POA: Diagnosis not present

## 2020-09-30 DIAGNOSIS — Z7984 Long term (current) use of oral hypoglycemic drugs: Secondary | ICD-10-CM | POA: Diagnosis not present

## 2020-09-30 DIAGNOSIS — Z9181 History of falling: Secondary | ICD-10-CM | POA: Diagnosis not present

## 2020-09-30 DIAGNOSIS — M47812 Spondylosis without myelopathy or radiculopathy, cervical region: Secondary | ICD-10-CM | POA: Diagnosis not present

## 2020-09-30 DIAGNOSIS — Z48812 Encounter for surgical aftercare following surgery on the circulatory system: Secondary | ICD-10-CM | POA: Diagnosis not present

## 2020-09-30 DIAGNOSIS — E785 Hyperlipidemia, unspecified: Secondary | ICD-10-CM | POA: Diagnosis not present

## 2020-09-30 DIAGNOSIS — N179 Acute kidney failure, unspecified: Secondary | ICD-10-CM | POA: Diagnosis not present

## 2020-09-30 DIAGNOSIS — Z951 Presence of aortocoronary bypass graft: Secondary | ICD-10-CM | POA: Diagnosis not present

## 2020-09-30 DIAGNOSIS — E119 Type 2 diabetes mellitus without complications: Secondary | ICD-10-CM | POA: Diagnosis not present

## 2020-09-30 DIAGNOSIS — I700 Atherosclerosis of aorta: Secondary | ICD-10-CM | POA: Diagnosis not present

## 2020-09-30 DIAGNOSIS — G931 Anoxic brain damage, not elsewhere classified: Secondary | ICD-10-CM | POA: Diagnosis not present

## 2020-09-30 DIAGNOSIS — Z7982 Long term (current) use of aspirin: Secondary | ICD-10-CM | POA: Diagnosis not present

## 2020-09-30 DIAGNOSIS — I119 Hypertensive heart disease without heart failure: Secondary | ICD-10-CM | POA: Diagnosis not present

## 2020-10-04 DIAGNOSIS — Z48812 Encounter for surgical aftercare following surgery on the circulatory system: Secondary | ICD-10-CM | POA: Diagnosis not present

## 2020-10-04 DIAGNOSIS — I350 Nonrheumatic aortic (valve) stenosis: Secondary | ICD-10-CM | POA: Diagnosis not present

## 2020-10-04 DIAGNOSIS — I2510 Atherosclerotic heart disease of native coronary artery without angina pectoris: Secondary | ICD-10-CM | POA: Diagnosis not present

## 2020-10-04 DIAGNOSIS — E119 Type 2 diabetes mellitus without complications: Secondary | ICD-10-CM | POA: Diagnosis not present

## 2020-10-04 DIAGNOSIS — I700 Atherosclerosis of aorta: Secondary | ICD-10-CM | POA: Diagnosis not present

## 2020-10-04 DIAGNOSIS — I119 Hypertensive heart disease without heart failure: Secondary | ICD-10-CM | POA: Diagnosis not present

## 2020-10-05 ENCOUNTER — Telehealth (HOSPITAL_COMMUNITY): Payer: Self-pay

## 2020-10-05 ENCOUNTER — Telehealth: Payer: Self-pay | Admitting: Cardiovascular Disease

## 2020-10-05 DIAGNOSIS — I350 Nonrheumatic aortic (valve) stenosis: Secondary | ICD-10-CM | POA: Diagnosis not present

## 2020-10-05 DIAGNOSIS — E119 Type 2 diabetes mellitus without complications: Secondary | ICD-10-CM | POA: Diagnosis not present

## 2020-10-05 DIAGNOSIS — R600 Localized edema: Secondary | ICD-10-CM

## 2020-10-05 DIAGNOSIS — I700 Atherosclerosis of aorta: Secondary | ICD-10-CM | POA: Diagnosis not present

## 2020-10-05 DIAGNOSIS — I2510 Atherosclerotic heart disease of native coronary artery without angina pectoris: Secondary | ICD-10-CM | POA: Diagnosis not present

## 2020-10-05 DIAGNOSIS — I119 Hypertensive heart disease without heart failure: Secondary | ICD-10-CM | POA: Diagnosis not present

## 2020-10-05 DIAGNOSIS — Z48812 Encounter for surgical aftercare following surgery on the circulatory system: Secondary | ICD-10-CM | POA: Diagnosis not present

## 2020-10-05 NOTE — Telephone Encounter (Signed)
LVM for return call. 

## 2020-10-05 NOTE — Telephone Encounter (Signed)
Called patient to see if he is interested in the Cardiac Rehab Program. Patient expressed interest. Explained scheduling process and went over insurance, patient verbalized understanding. Will contact patient for scheduling once f/u has been completed.  °

## 2020-10-05 NOTE — Telephone Encounter (Signed)
Beth, RN with [facility] is returning call. She is requesting a voicemail with recommendations if she isn't available when her call is returned. She states the voicemail is secure.

## 2020-10-05 NOTE — Telephone Encounter (Signed)
Pt is continuing to have swelling in left leg. Has 2 plus edema in both legs when first d/c. Persistent in 2 plus below the knee non fitting in upper left leg. Maybe some trace in edema on right side. Wants to make sure clot is not going on. Left leg all bruised up. Gave him 5 days of lasix to take from d/c and wanting to know if he needs to continue with Lasix 40 MG. Last day was yesterday for medication given.

## 2020-10-05 NOTE — Telephone Encounter (Signed)
Spoke with Eustaquio Maize from [place] who report pt was d/c with 5 days of  lasix due to bilateral leg edema, She report left side has gotten better but right remains swollen. She state pt denies SOB or any other symptoms, but wanted to know if MD recommends continuing with current lasix dose as pt took last dose yesterday.   Will forward to MD for recommendations.

## 2020-10-07 DIAGNOSIS — I700 Atherosclerosis of aorta: Secondary | ICD-10-CM | POA: Diagnosis not present

## 2020-10-07 DIAGNOSIS — I350 Nonrheumatic aortic (valve) stenosis: Secondary | ICD-10-CM | POA: Diagnosis not present

## 2020-10-07 DIAGNOSIS — I119 Hypertensive heart disease without heart failure: Secondary | ICD-10-CM | POA: Diagnosis not present

## 2020-10-07 DIAGNOSIS — E119 Type 2 diabetes mellitus without complications: Secondary | ICD-10-CM | POA: Diagnosis not present

## 2020-10-07 DIAGNOSIS — I2510 Atherosclerotic heart disease of native coronary artery without angina pectoris: Secondary | ICD-10-CM | POA: Diagnosis not present

## 2020-10-07 DIAGNOSIS — Z48812 Encounter for surgical aftercare following surgery on the circulatory system: Secondary | ICD-10-CM | POA: Diagnosis not present

## 2020-10-07 NOTE — Telephone Encounter (Signed)
Spoke with [place] from [place]. Patient finished 5 day course of lasix after hospital discharge. He has 2+ pitting edema in LLE (upper leg non-pitting). Swelling resolved in right leg. He had CABG and had a vein graft from this leg, has extensive bruising due the sheath, etc. She reports he has gained about 1.5lbs over the last 24-48 hours but his diet has improved. He cannot get his compression stockings on. Beth states patient's left foot looks like "play-doh". He has slightly diminished pulses, but present. She is worried about a possible clot, given persistent edema and also concerned that he has completed his lasix course.  He has an appointment 10/12/20 with Urban Gibson NP   Advised will route message to DOD (Dr. Sallyanne Kuster) to review and advise There are some vascular openings this afternoon and tomorrow

## 2020-10-07 NOTE — Telephone Encounter (Signed)
Spoke with patient's wife and explained recommendation from MD Test ordered and she is aware someone will contact them to arrange appointment

## 2020-10-07 NOTE — Telephone Encounter (Signed)
Beth from [place] calling back to full up. She states the patient has 2 plus edema below the knee and non pitting edema above the knee. She states he also has a hardened ara where he had bleeding after the catheterization. She states his swelling is only on his left side and that the swelling is gone on his right. She states he also finished his 5 day coarse of fluid pills 2 days ago and so he is now out.

## 2020-10-07 NOTE — Telephone Encounter (Signed)
Croitoru, Dani Gobble, MD  Fidel Levy, RN Caller: Unspecified (2 days ago,  3:56 PM) Please go ahead and order a lower extremity venous duplex US for edema. Thanks

## 2020-10-07 NOTE — Telephone Encounter (Signed)
Left message for patient to call back  Unable to reach [facility] w/[facility]  Will need to order LE venous duplex upon return call and have scheduled ASAP

## 2020-10-07 NOTE — Telephone Encounter (Signed)
Patient scheduled for test 10/08/20 @ 10am

## 2020-10-08 ENCOUNTER — Ambulatory Visit (HOSPITAL_COMMUNITY)
Admission: RE | Admit: 2020-10-08 | Discharge: 2020-10-08 | Disposition: A | Discharge status: Home / Self Care | Payer: Medicare Other | Source: Ambulatory Visit | Attending: Cardiovascular Disease | Admitting: Cardiovascular Disease

## 2020-10-08 ENCOUNTER — Other Ambulatory Visit: Payer: Self-pay

## 2020-10-08 DIAGNOSIS — R600 Localized edema: Secondary | ICD-10-CM | POA: Diagnosis not present

## 2020-10-08 NOTE — Telephone Encounter (Signed)
Pt wife Ginger called wanting to know about the pt insurance for cardiac rehab, I advised pt wife that pt didn't have a DPR on file for me to speak with her and that the pt would need to fill out a DPR so that the office will scan it into his chart advised pt wife until then I only could give her general information. Pt understood and stated that they were at the cardiologist office now and that she will get him to fill a DPR out and hand it in.

## 2020-10-11 DIAGNOSIS — I700 Atherosclerosis of aorta: Secondary | ICD-10-CM | POA: Diagnosis not present

## 2020-10-11 DIAGNOSIS — E119 Type 2 diabetes mellitus without complications: Secondary | ICD-10-CM | POA: Diagnosis not present

## 2020-10-11 DIAGNOSIS — I119 Hypertensive heart disease without heart failure: Secondary | ICD-10-CM | POA: Diagnosis not present

## 2020-10-11 DIAGNOSIS — I350 Nonrheumatic aortic (valve) stenosis: Secondary | ICD-10-CM | POA: Diagnosis not present

## 2020-10-11 DIAGNOSIS — Z48812 Encounter for surgical aftercare following surgery on the circulatory system: Secondary | ICD-10-CM | POA: Diagnosis not present

## 2020-10-11 DIAGNOSIS — I2510 Atherosclerotic heart disease of native coronary artery without angina pectoris: Secondary | ICD-10-CM | POA: Diagnosis not present

## 2020-10-11 NOTE — Telephone Encounter (Signed)
Called pt's wife back and went over the cardiac rehab program and his insurance with her and advised her of the 1-3 month backlog we have right now, she stated that pt is interested I advised her that we will call back when pt is ready to schedule.

## 2020-10-12 ENCOUNTER — Ambulatory Visit (INDEPENDENT_AMBULATORY_CARE_PROVIDER_SITE_OTHER): Payer: Medicare Other | Admitting: Family

## 2020-10-12 ENCOUNTER — Other Ambulatory Visit: Payer: Self-pay

## 2020-10-12 ENCOUNTER — Encounter (HOSPITAL_BASED_OUTPATIENT_CLINIC_OR_DEPARTMENT_OTHER): Payer: Self-pay | Admitting: Family

## 2020-10-12 VITALS — BP 138/76 | HR 66 | Ht 66.0 in | Wt 190.2 lb

## 2020-10-12 DIAGNOSIS — I5042 Chronic combined systolic (congestive) and diastolic (congestive) heart failure: Secondary | ICD-10-CM

## 2020-10-12 DIAGNOSIS — I25118 Atherosclerotic heart disease of native coronary artery with other forms of angina pectoris: Secondary | ICD-10-CM

## 2020-10-12 DIAGNOSIS — D649 Anemia, unspecified: Secondary | ICD-10-CM

## 2020-10-12 DIAGNOSIS — E785 Hyperlipidemia, unspecified: Secondary | ICD-10-CM

## 2020-10-12 DIAGNOSIS — I469 Cardiac arrest, cause unspecified: Secondary | ICD-10-CM

## 2020-10-12 DIAGNOSIS — Z951 Presence of aortocoronary bypass graft: Secondary | ICD-10-CM

## 2020-10-12 DIAGNOSIS — I10 Essential (primary) hypertension: Secondary | ICD-10-CM

## 2020-10-12 DIAGNOSIS — I350 Nonrheumatic aortic (valve) stenosis: Secondary | ICD-10-CM

## 2020-10-12 DIAGNOSIS — R600 Localized edema: Secondary | ICD-10-CM

## 2020-10-12 LAB — CBC
Hematocrit: 32.5 % — ABNORMAL LOW (ref 37.5–51.0)
Hemoglobin: 10.5 g/dL — ABNORMAL LOW (ref 13.0–17.7)
MCH: 29.7 pg (ref 26.6–33.0)
MCHC: 32.3 g/dL (ref 31.5–35.7)
MCV: 92 fL (ref 79–97)
Platelets: 260 10*3/uL (ref 150–450)
RBC: 3.54 x10E6/uL — ABNORMAL LOW (ref 4.14–5.80)
RDW: 14.5 % (ref 11.6–15.4)
WBC: 5.6 10*3/uL (ref 3.4–10.8)

## 2020-10-12 LAB — COMPREHENSIVE METABOLIC PANEL
ALT: 15 IU/L (ref 0–44)
AST: 23 IU/L (ref 0–40)
Albumin/Globulin Ratio: 1.4 (ref 1.2–2.2)
Albumin: 4 g/dL (ref 3.7–4.7)
Alkaline Phosphatase: 114 IU/L (ref 44–121)
BUN/Creatinine Ratio: 15 (ref 10–24)
BUN: 13 mg/dL (ref 8–27)
Bilirubin Total: 1 mg/dL (ref 0.0–1.2)
CO2: 25 mmol/L (ref 20–29)
Calcium: 9.1 mg/dL (ref 8.6–10.2)
Chloride: 98 mmol/L (ref 96–106)
Creatinine, Ser: 0.86 mg/dL (ref 0.76–1.27)
Globulin, Total: 2.8 g/dL (ref 1.5–4.5)
Glucose: 80 mg/dL (ref 65–99)
Potassium: 4.3 mmol/L (ref 3.5–5.2)
Sodium: 138 mmol/L (ref 134–144)
Total Protein: 6.8 g/dL (ref 6.0–8.5)
eGFR: 90 mL/min/{1.73_m2} (ref >59)

## 2020-10-12 MED ORDER — POTASSIUM CHLORIDE CRYS ER 20 MEQ PO TBCR: EXTENDED_RELEASE_TABLET | ORAL | 0 refills | Status: DC

## 2020-10-12 MED ORDER — FUROSEMIDE 40 MG PO TABS: ORAL_TABLET | ORAL | 0 refills | Status: DC

## 2020-10-12 MED ORDER — ENTRESTO 24-26 MG PO TABS
1.0000 | ORAL_TABLET | Freq: Two times a day (BID) | ORAL | 1 refills | Status: DC
Start: 2020-10-12 — End: 2020-11-24

## 2020-10-12 NOTE — Progress Notes (Addendum)
Office Visit    Patient Name: Brian Robinson Date of Encounter: 10/12/2020  PCP:  Seward Carol, [place]  Cardiologist:  Shelva Majestic, MD  Advanced Practice Provider:  No care team member to display Electrophysiologist:  None   Chief Complaint    Brian Robinson is a 75 y.o. male with a hx of VT/VF arrest with CAD s/p CABGx3, HLD, HTN, DM2 presents today for follow-up after CABG  Past Medical History    Past Medical History:  Diagnosis Date   COVID-19    received Mab 01/2020   Diabetes mellitus without complication ([place])    Heart murmur    Hypertension    Mild aortic stenosis    Past Surgical History:  Procedure Laterality Date   CORONARY ARTERY BYPASS GRAFT N/A 09/23/2020   Procedure: CORONARY ARTERY BYPASS GRAFTING (CABG), ON PUMP, TIMES THREE, USING LEFT INTERNAL MAMMARY ARTERY AND LEFT ENDOSCOPICALLY HARVESTED GREATER SAPHENOUS VEIN;  Surgeon: [name], MD;  Location: [facility];  Service: Open Heart Surgery;  Laterality: N/A;   ENDOVEIN HARVEST OF GREATER SAPHENOUS VEIN Left 09/23/2020   Procedure: ENDOVEIN HARVEST OF GREATER SAPHENOUS VEIN;  Surgeon: [name], MD;  Location: [facility];  Service: Open Heart Surgery;  Laterality: Left;   LEFT HEART CATH AND CORONARY ANGIOGRAPHY N/A 09/20/2020   Procedure: LEFT HEART CATH AND CORONARY ANGIOGRAPHY;  Surgeon: Nelva Bush, MD;  Location: [facility];  Service: Cardiovascular;  Laterality: N/A;   TEE WITHOUT CARDIOVERSION N/A 09/23/2020   Procedure: TRANSESOPHAGEAL ECHOCARDIOGRAM (TEE);  Surgeon: [name], MD;  Location: [facility];  Service: Open Heart Surgery;  Laterality: N/A;    Allergies  No Known Allergies  History of Present Illness    Brian Robinson is a 75 y.o. male with a hx of VT/VF arrest with CAD s/p CABGx3, HLD, HTN, DM2 last seen while hospitalized.  He presented to the ED 09/16/2020 after a witnessed cardiac arrest.  His wife performed  chest compressions until EMS arrived.  He underwent EEG by neurology on admission showing encephalopathy.  He was extubated 09/17/2020.  He underwent cardiac catheterization 09/20/2020 showing three-vessel CAD.  He was taken to the operating room 09/23/20 underwent CABG X3 (LIMA-LAD, SVG-PDA, SVG-diagonal).  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  Postoperative EKG with flattened T wave.  He had expected postoperative blood loss anemia with hemoglobin of 7.1 and was provided 1 unit PRBC.  He developed atrial fibrillation was treated with amiodarone.  Treated for postoperative volume excess with Lasix and potassium.  Statin was held due to elevated LFTs likely with shock liver per surgeon's documentation.  He had stable though extensive ecchymosis of LLE and flank.  He was discharged wearing a [place] [facility] nurse contacted the office [phone] persistent lower extremity edema.  He was recommended for lower extremity ultrasound which was unrevealing for DVT.  He presents today for follow-up with his wife. Reviewed hospital testing and results in detail as well as medication changes. Reports feeling overall well since discharge and walking for exercise. He is wearing LifeVest as recommended. Notes only minimal incisional soreness. Reports no shortness of breath nor dyspnea on exertion. Reports no chest pain, pressure, or tightness. No  orthopnea, PND. Reports no palpitations.  Notes LLE continues to be swollen and is sitting with leg elevated. Cannot tolerate compression sock due to swelling at this time. BP at home has routinely been 150s  systolic when checked with arm cuff.   EKGs/Labs/Other Studies Reviewed:   The following studies were reviewed today:  CATH: 09/20/2020 Conclusions: Severe three-vessel coronary artery disease, including chronic total occlusion of proximal LAD with left-to-left collaterals, 75% ostial/proximal ramus intermedius stenosis,  and sequential 75% ostial and 90% distal RCA lesions.  LCx and OM3 have mild to moderate disease of up to 40%. Moderately elevated left ventricular filling pressure (LVEDP ~30 mmHg). Mild aortic stenosis (peak-to-peak gradient 10-15 mmHg.   Recommendations: Imaged reviewed with Dr. Burt Knack during the procedure.  We will plan for cardiac surgery consultation for CABG given severe LAD and RCA disease with good distal targets in both vessels. Defer reinitiation of IV heparin, as the coronary artery disease appears chronic and hemoglobin has trended down significantly since admission. Aggressive secondary prevention. Diurese with furosemide 40 mg IV x 1.  Further diureses to be based on urine output, volume status, and renal function.       Echo 09/17/20  1. Mild hypokinesis of the distal septum with overall preserved LV  function; calcified aortic valve with mild AS by doppler but visually  looks moderate.   2. Left ventricular ejection fraction, by estimation, is 55 to 60%. The  left ventricle has normal function. The left ventricle demonstrates  regional wall motion abnormalities (see scoring diagram/findings for  description). There is mild left ventricular   hypertrophy. Left ventricular diastolic parameters are consistent with  Grade II diastolic dysfunction (pseudonormalization). Elevated left atrial  pressure.   3. Right ventricular systolic function is normal. The right ventricular  size is normal.   4. The mitral valve is normal in structure. Trivial mitral valve  regurgitation. No evidence of mitral stenosis. Moderate mitral annular  calcification.   5. The aortic valve is calcified. Aortic valve regurgitation is not  visualized. Mild aortic valve stenosis.   6. The inferior vena cava is normal in size with greater than 50%  respiratory variability, suggesting right atrial pressure of 3 mmHg.   EKG:  EKG is ordered today.  The ekg ordered today demonstrates NSR 66bpm with TWI  in anterolateral leads - stable post MI changes.  Recent Labs: 09/17/2020: TSH 1.755 09/24/2020: Magnesium 2.8 09/28/2020: ALT 128; BUN 23; Creatinine, Ser 1.20; Hemoglobin 10.3; Platelets 244; Potassium 3.6; Sodium 130  Recent Lipid Panel    Component Value Date/Time   CHOL 141 09/18/2020 0023   TRIG 96 09/18/2020 0023   HDL 40 (L) 09/18/2020 0023   CHOLHDL 3.5 09/18/2020 0023   VLDL 19 09/18/2020 0023   LDLCALC 82 09/18/2020 0023    Home Medications   No outpatient medications have been marked as taking for the 10/12/20 encounter (Appointment) with Loel Dubonnet, NP.     Review of Systems      All other systems reviewed and are otherwise negative except as noted above.  Physical Exam    VS:  There were no vitals taken for this visit. , BMI There is no height or weight on file to calculate BMI.  Wt Readings from Last 3 Encounters:  09/28/20 194 lb 3.6 oz (88.1 kg)  02/27/14 232 lb 3.2 oz (105.3 kg)     GEN: Well nourished, well developed, in no acute distress. LifeVest present. HEENT: normal. Neck: Supple, no JVD, carotid bruits, or masses. Cardiac: RRR, no murmurs, rubs, or gallops. No clubbing, cyanosis. LLE 2+ pitting edema. .  Radials/PT 2+ and equal bilaterally.  Respiratory:  Respirations regular and unlabored, clear to auscultation bilaterally. GI:  Soft, nontender, nondistended. MS: No deformity or atrophy. Skin: Warm and dry, no rash. Midsternal incision and bilateral vein grafting sites clean, dry, intact with well approximated edges and no signs of infection. Noted possible puncture site to LLE distal to SVG site covered with gauze with small amount of bleeding. No erythema nor signs of infection. Dressing replaced in clinic.  Neuro:  Strength and sensation are intact. Psych: Normal affect.  Assessment & Plan    CAD s/p NSTEMI and CABG X3 - Stable with no anginal symptoms. No indication for ischemic evaluation.  Midsternal incision healing appropriately. Only  mild incisional soreness. EKG today with expected anterolateral TWI. GDMT includesatenolol, aspirin. Statin on hold due to transaminitis, as below. Encouraged to participate in cardiac rehab. Heart healthy diet and regular cardiovascular exercise encouraged.  Per Dr. Angelena Form could consider addition of Plavix given NSTEMI presentation, will defer until post operative anemia resolves.  LLE edema - LLE duplex 10/08/20 with no evidence of DVT. LLE with 2+ pitting edema. No erythema nor signs of infection, no indication for abx at this time. CBC to rule out infection. LLE with puncture site distal to LLE SVG site with small amount of bleeding. Dressing replaced in clinic and betadine applied. Encouraged to keep covered. Rx Lasix 40mg  QD and Potassium 106mEq QD x5 days. Will call him in 1 week to reassess response.   VT/VF arrest / Chronic systolic heart failure-intraoperative TEE with EF 35-40%.  LifeVest placed at recent discharge. As he is high risk for recurrent cardiac arrest, per AHA guideline LifeVest should remain in place until LVEF has recovered. Plan to repeat echo in 3 mos to reassess LVEF. If LVEF >35%, can discontinue Life Vest. If LifeVest <35%, refer to EP for consideration of ICD. LLE with edema but no orthopnea nor PND. GDMT includes Atenolol. 5 day course of Lasix, as above. Given elevated BP and for optimization of HF therapy, stop Losartan and start Entresto 24-26mg  BID. 30 day free coupon provided. Future considerations include transition from Atenolol to Carvedilol, addition of Spironolactone or SLGT2i.   HTN- BP elevated. Stop Losartan, start Entresto 24-26mg  BID as above. Home monitoring encouraged.  HLD- 09/18/20 LDL 83. He had postoperative liver shock with transaminitia (09/28/20 AST 248, ALT 128). Repeat CMP today. If liver function improved, resume statin. If not, refer to lipid clinic for consideration of PCSK9i.  Postoperative anemia - CBC ordered for monitoring. Encouraged PO sources  of iron.   DM2- 09/18/20 A1c 5.4. Continue to follow with PCP.   Mild aortic stenosis - Noted by echo 09/17/20. Continue to monitor with periodic echo. No murmur appreciated on exam.  Disposition: Follow up in 1 month with Dr. Claiborne Billings or APP.  Signed, Loel Dubonnet, NP 10/12/2020, 7:30 AM [place]

## 2020-10-12 NOTE — Patient Instructions (Addendum)
Medication Instructions:  Your physician has recommended you make the following change in your medication:   STOP Losartan  START Entresto 24-26mg  one tablet twice daily  START Furosemide 40mg  one tablet daily for 5 days  START Potassium 20 mEq daily for 5 days  *If you need a refill on your cardiac medications before your next appointment, please call your pharmacy*   Lab Work: Your physician recommends that you return for lab work: CMP, CBC  If you have labs (blood work) drawn today and your tests are completely normal, you will receive your results only by: [place] (if you have MyChart) OR A paper copy in the mail If you have any lab test that is abnormal or we need to change your treatment, we will call you to review the results.   Testing/Procedures: Your EKG today shows stable findings after open heart surgery.   Your physician has requested that you have an echocardiogram on or after 12/24/20 to recheck your heart pumping function. Echocardiography is a painless test that uses sound waves to create images of your heart. It provides your doctor with information about the size and shape of your heart and how well your heart's chambers and valves are working. This procedure takes approximately one hour. There are no restrictions for this procedure.   Follow-Up: At [facility], you and your health needs are our priority.  As part of our continuing mission to provide you with exceptional heart care, we have created designated Provider Care Teams.  These Care Teams include your primary Cardiologist (physician) and Advanced Practice Providers (APPs -  Physician Assistants and Nurse Practitioners) who all work together to provide you with the care you need, when you need it.  We recommend signing up for the patient portal called "MyChart".  Sign up information is provided on this After Visit Summary.  MyChart is used to connect with patients for Virtual Visits (Telemedicine).   Patients are able to view lab/test results, encounter notes, upcoming appointments, etc.  Non-urgent messages can be sent to your provider as well.   To learn more about what you can do with MyChart, go to [email or web address].    Your next appointment:   1 month  11/19/2020 ARRIVE AT 3:00 TO SEE Doreene Adas, PA-C  The format for your next appointment:   In Person  Provider:   You may see Shelva Majestic, MD or one of the following Advanced Practice Providers on your designated Care Team:   Almyra Deforest, PA-C Fabian Sharp, PA-C or  Roby Lofts, [place]  Other Instructions  Heart Healthy Diet Recommendations: A low-salt diet is recommended. Meats should be grilled, baked, or boiled. Avoid fried foods. Focus on lean protein sources like fish or chicken with vegetables and fruits. The American Heart Association is a Microbiologist!  American Heart Association Diet and Lifeystyle Recommendations   Exercise recommendations: The American Heart Association recommends 150 minutes of moderate intensity exercise weekly. Try 30 minutes of moderate intensity exercise 4-5 times per week. This could include walking, jogging, or swimming.  You have been referred to cardiac rehab while in the hospital. This is a combination program including monitored exercise, dietary education, and support group. We strongly recommend participating in the program.

## 2020-10-14 DIAGNOSIS — I2510 Atherosclerotic heart disease of native coronary artery without angina pectoris: Secondary | ICD-10-CM | POA: Diagnosis not present

## 2020-10-14 DIAGNOSIS — I350 Nonrheumatic aortic (valve) stenosis: Secondary | ICD-10-CM | POA: Diagnosis not present

## 2020-10-14 DIAGNOSIS — I119 Hypertensive heart disease without heart failure: Secondary | ICD-10-CM | POA: Diagnosis not present

## 2020-10-14 DIAGNOSIS — E119 Type 2 diabetes mellitus without complications: Secondary | ICD-10-CM | POA: Diagnosis not present

## 2020-10-14 DIAGNOSIS — Z48812 Encounter for surgical aftercare following surgery on the circulatory system: Secondary | ICD-10-CM | POA: Diagnosis not present

## 2020-10-14 DIAGNOSIS — I700 Atherosclerosis of aorta: Secondary | ICD-10-CM | POA: Diagnosis not present

## 2020-10-18 ENCOUNTER — Other Ambulatory Visit: Payer: Self-pay | Admitting: Thoracic Surgery (Cardiothoracic Vascular Surgery)

## 2020-10-18 DIAGNOSIS — Z48812 Encounter for surgical aftercare following surgery on the circulatory system: Secondary | ICD-10-CM | POA: Diagnosis not present

## 2020-10-18 DIAGNOSIS — I2510 Atherosclerotic heart disease of native coronary artery without angina pectoris: Secondary | ICD-10-CM | POA: Diagnosis not present

## 2020-10-18 DIAGNOSIS — I700 Atherosclerosis of aorta: Secondary | ICD-10-CM | POA: Diagnosis not present

## 2020-10-18 DIAGNOSIS — E119 Type 2 diabetes mellitus without complications: Secondary | ICD-10-CM | POA: Diagnosis not present

## 2020-10-18 DIAGNOSIS — I350 Nonrheumatic aortic (valve) stenosis: Secondary | ICD-10-CM | POA: Diagnosis not present

## 2020-10-18 DIAGNOSIS — Z951 Presence of aortocoronary bypass graft: Secondary | ICD-10-CM

## 2020-10-18 DIAGNOSIS — I119 Hypertensive heart disease without heart failure: Secondary | ICD-10-CM | POA: Diagnosis not present

## 2020-10-19 ENCOUNTER — Encounter: Payer: Self-pay | Admitting: Thoracic Surgery (Cardiothoracic Vascular Surgery)

## 2020-10-19 ENCOUNTER — Ambulatory Visit (INDEPENDENT_AMBULATORY_CARE_PROVIDER_SITE_OTHER): Payer: Self-pay | Admitting: Thoracic Surgery (Cardiothoracic Vascular Surgery)

## 2020-10-19 ENCOUNTER — Telehealth (HOSPITAL_COMMUNITY): Payer: Self-pay | Admitting: *Deleted

## 2020-10-19 ENCOUNTER — Other Ambulatory Visit: Payer: Self-pay

## 2020-10-19 ENCOUNTER — Encounter (HOSPITAL_BASED_OUTPATIENT_CLINIC_OR_DEPARTMENT_OTHER): Payer: Self-pay

## 2020-10-19 ENCOUNTER — Ambulatory Visit
Admission: RE | Admit: 2020-10-19 | Discharge: 2020-10-19 | Disposition: A | Discharge status: Home / Self Care | Payer: Medicare Other | Source: Ambulatory Visit | Attending: Thoracic Surgery (Cardiothoracic Vascular Surgery) | Admitting: Thoracic Surgery (Cardiothoracic Vascular Surgery)

## 2020-10-19 VITALS — BP 103/64 | HR 74 | Resp 20 | Ht 66.0 in | Wt 187.0 lb

## 2020-10-19 DIAGNOSIS — Z951 Presence of aortocoronary bypass graft: Secondary | ICD-10-CM

## 2020-10-19 DIAGNOSIS — J8410 Pulmonary fibrosis, unspecified: Secondary | ICD-10-CM | POA: Diagnosis not present

## 2020-10-19 DIAGNOSIS — Z48812 Encounter for surgical aftercare following surgery on the circulatory system: Secondary | ICD-10-CM | POA: Diagnosis not present

## 2020-10-19 DIAGNOSIS — E119 Type 2 diabetes mellitus without complications: Secondary | ICD-10-CM | POA: Diagnosis not present

## 2020-10-19 DIAGNOSIS — I119 Hypertensive heart disease without heart failure: Secondary | ICD-10-CM | POA: Diagnosis not present

## 2020-10-19 DIAGNOSIS — I2510 Atherosclerotic heart disease of native coronary artery without angina pectoris: Secondary | ICD-10-CM | POA: Diagnosis not present

## 2020-10-19 DIAGNOSIS — I350 Nonrheumatic aortic (valve) stenosis: Secondary | ICD-10-CM | POA: Diagnosis not present

## 2020-10-19 DIAGNOSIS — I700 Atherosclerosis of aorta: Secondary | ICD-10-CM | POA: Diagnosis not present

## 2020-10-19 NOTE — Telephone Encounter (Signed)
Returned pt wife message regarding Cardiac rehab.  Confirmed DPR has been signed. Confirmed that yes, her husband has been cleared to begin CR. Stanton Kidney indicated that he has one remaining therapy visit next Thursday.  Informed pt wife that he has been added to the wait list for scheduling. He will be contacted for scheduling as his name comes up to the top of the list. Strongly encouraged pt and his wife to please continue with the exercises provided by the therapist during this interim so that he will not loose any of his increased mobility, stamina and strength.  Verbalized understanding. Cherre Huger, BSN Cardiac and Training and development officer

## 2020-10-19 NOTE — Progress Notes (Signed)
[facility] [address]       [place] [address]             [phone]     HPI: Mr. Dicks returns for follow-up after recent coronary bypass grafting  Avner Marchi is a 75 year old man who presented with an out of hospital cardiac arrest.  His wife started CPR and called EMS.  He required a single defibrillation.  He ruled in for MI.  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  He underwent cardiac catheterization which revealed severe three-vessel disease.  Intraoperative TEE revealed an EF of 40 to 45%.  There was mild AS.   He underwent coronary artery bypass grafting x3 on 09/23/2020.  He had good quality targets and conduits.  His AS was only mild so we did not intervene on that.  He did well postoperatively and went home on day 5.  Since discharge she has continued to have some swelling in his left foot and ankle.  That actually predated his surgery.  We did have to take some vein from the left leg.  He had a duplex which showed no evidence of DVT.  His swelling has improved with Lasix.  He is not having any incisional pain.  He has not had any presyncope or syncopal spells.  Past Medical History:  Diagnosis Date   COVID-19    received Mab 01/2020   Diabetes mellitus without complication (HCC)    Heart murmur    Hypertension    Mild aortic stenosis     Current Outpatient Medications  Medication Sig Dispense Refill   acetaminophen (TYLENOL) 500 MG tablet Take 2 tablets (1,000 mg total) by mouth every 6 (six) hours as needed. 30 tablet 0   aspirin EC 325 MG EC tablet Take 1 tablet (325 mg total) by mouth daily. 30 tablet 0   atenolol (TENORMIN) 50 MG tablet Take 1 tablet (50 mg total) by mouth daily. 30 tablet 1   diphenhydrAMINE (BENADRYL) 25 mg capsule Take 25 mg by mouth every 6 (six) hours as needed for allergies or sleep.     diphenhydramine-acetaminophen (TYLENOL PM) 25-500 MG TABS tablet Take 1-2 tablets by mouth at bedtime as  needed (for sleep).     metFORMIN (GLUCOPHAGE) 500 MG tablet Take 500 mg by mouth 2 (two) times daily with a meal.     Multiple Vitamins-Minerals (PRESERVISION AREDS 2) CAPS Take 1 capsule by mouth 2 (two) times daily.     sacubitril-valsartan (ENTRESTO) 24-26 MG Take 1 tablet by mouth 2 (two) times daily. 60 tablet 1   sertraline (ZOLOFT) 50 MG tablet Take 50 mg by mouth in the morning.     traMADol (ULTRAM) 50 MG tablet Take 1 tablet (50 mg total) by mouth every 6 (six) hours as needed for moderate pain. 28 tablet 0   furosemide (LASIX) 40 MG tablet TAKE 1 TABLET BY MOUTH DAILY FOR 5 DAYS THEN HOLD 30 tablet 0   potassium chloride SA (KLOR-CON) 20 MEQ tablet TAKE 1 TABLET BY MOUTH DAILY FOR 5 DAYS THEN HOLD 30 tablet 0   No current facility-administered medications for this visit.    Physical Exam BP 103/64   Pulse 74   Resp 20   Ht '5\' 6"'$  (1.676 m)   Wt 187 lb (84.8 kg)   SpO2 97% Comment: RA  BMI 30.60 kg/m  75 year old man in no acute distress Alert and oriented x3 with no focal deficits Lungs clear  bilaterally Cardiac regular rate and rhythm with faint systolic murmur Sternum stable, incision healing well Leg incisions with some mild erythema, no drainage, trace edema left foot and ankle  Diagnostic Tests: CHEST - 2 VIEW   COMPARISON:  09/25/2020   FINDINGS: The cardiac silhouette, mediastinal and hilar contours within normal limits. Stable surgical changes from bypass surgery.   No acute pulmonary findings. Stable mild eventration of the right hemidiaphragm. Stable calcified granuloma in the right lung apex.   IMPRESSION: Status post bypass surgery.  No acute cardiopulmonary findings.     Electronically Signed   By: Marijo Sanes M.D.   On: 10/19/2020 09:30    Impression: Amaury Mascioli is a 75 year old man who presented with an out of hospital cardiac arrest.  His wife started CPR and called EMS.  He required a single defibrillation.  He ruled in for MI.  He  had anoxic encephalopathy initially but that improved rapidly.  He underwent cardiac catheterization which revealed severe three-vessel disease.  Intraoperative TEE revealed an EF of 40 to 45%.  There was mild AS.   He underwent coronary artery bypass grafting x3 on 09/23/2020.  He had good quality targets and conduits.  His AS was only mild so we did not intervene on that.  CAD-status post CABG.  No anginal symptoms.  He is on aspirin.  Cardiology is considering starting Plavix at some point as well.  Out of hospital cardiac arrest-has LifeVest in place.  Plan is to repeat echo at 3 months postop to see if he needs ICD.  Doing well from a surgical standpoint.  He should not drive until he is cleared by cardiology which I suspect will be after the have evaluated him for possible ICD.  No lifting over 10 pounds for another month (8 weeks postop).  Continue with ambulation.  Recommend cardiac rehab.  Plan: Cardiac rehab Follow-up with cardiology as scheduled I will be happy to see Mr. Graffeo back anytime in the future if I can be of any further assistance with his care  [name], MD [facility] [phone]

## 2020-10-20 ENCOUNTER — Other Ambulatory Visit: Payer: Self-pay | Admitting: Physician Assistant

## 2020-10-21 DIAGNOSIS — I119 Hypertensive heart disease without heart failure: Secondary | ICD-10-CM | POA: Diagnosis not present

## 2020-10-21 DIAGNOSIS — I350 Nonrheumatic aortic (valve) stenosis: Secondary | ICD-10-CM | POA: Diagnosis not present

## 2020-10-21 DIAGNOSIS — Z48812 Encounter for surgical aftercare following surgery on the circulatory system: Secondary | ICD-10-CM | POA: Diagnosis not present

## 2020-10-21 DIAGNOSIS — E119 Type 2 diabetes mellitus without complications: Secondary | ICD-10-CM | POA: Diagnosis not present

## 2020-10-21 DIAGNOSIS — I700 Atherosclerosis of aorta: Secondary | ICD-10-CM | POA: Diagnosis not present

## 2020-10-21 DIAGNOSIS — I2510 Atherosclerotic heart disease of native coronary artery without angina pectoris: Secondary | ICD-10-CM | POA: Diagnosis not present

## 2020-10-26 ENCOUNTER — Telehealth: Payer: Self-pay | Admitting: Cardiovascular Disease

## 2020-10-26 DIAGNOSIS — I700 Atherosclerosis of aorta: Secondary | ICD-10-CM | POA: Diagnosis not present

## 2020-10-26 DIAGNOSIS — I2510 Atherosclerotic heart disease of native coronary artery without angina pectoris: Secondary | ICD-10-CM | POA: Diagnosis not present

## 2020-10-26 DIAGNOSIS — E119 Type 2 diabetes mellitus without complications: Secondary | ICD-10-CM | POA: Diagnosis not present

## 2020-10-26 DIAGNOSIS — I350 Nonrheumatic aortic (valve) stenosis: Secondary | ICD-10-CM | POA: Diagnosis not present

## 2020-10-26 DIAGNOSIS — Z48812 Encounter for surgical aftercare following surgery on the circulatory system: Secondary | ICD-10-CM | POA: Diagnosis not present

## 2020-10-26 DIAGNOSIS — I119 Hypertensive heart disease without heart failure: Secondary | ICD-10-CM | POA: Diagnosis not present

## 2020-10-26 NOTE — Telephone Encounter (Signed)
Pt c/o BP issue: STAT if pt c/o blurred vision, one-sided weakness or slurred speech  1. What are your last 5 BP readings? BP 152/74 HR 69  2. Are you having any other symptoms (ex. Dizziness, headache, blurred vision, passed out)? No   3. What is your BP issue? Weight is back up 185.2 yesterday 182.8.      Pt c/o swelling: STAT is pt has developed SOB within 24 hours  If swelling, where is the swelling located? Left ankle  How much weight have you gained and in what time span? A few days  Have you gained 3 pounds in a day or 5 pounds in a week? yes  Do you have a log of your daily weights (if so, list)? no  Are you currently taking a fluid pill? No. He is out patient was put on laxis for 5 days  Are you currently SOB? No   Have you traveled recently? No please call RN

## 2020-10-26 NOTE — Telephone Encounter (Signed)
Returned call to crystal she states that pt's weight Sunday was 186.4# yesterday was 182.8 and today 185.2#. ok to take lasix '20mg'$ ? Pt still has some available at home. Pt would also like to know if it is ok for him to use his elliptical machine. Please advise

## 2020-10-26 NOTE — Telephone Encounter (Signed)
Okay to take Lasix '20mg'$  PRN for weight gain 2 lbs overnight or 5 lbs in one week or edema.   Brian Dubonnet, NP

## 2020-10-28 MED ORDER — FUROSEMIDE 20 MG PO TABS: ORAL_TABLET | ORAL | 3 refills | Status: DC

## 2020-10-28 NOTE — Telephone Encounter (Signed)
Pt notified. He will make sure to check the dosage of his lasix. He will call back PR.

## 2020-10-28 NOTE — Telephone Encounter (Signed)
Called and spoke with [name]-she is the home health nurse. Informed her of Caitlyn's message and verified follow up appointment later this month with the ECHO in August. Verbalizes understanding. I will call pt/wife with these results. UPDATED RX SENT TO PHARMACY [id number]-pt/wife-to update on sig for lasix and follow up appointment.

## 2020-10-28 NOTE — Telephone Encounter (Signed)
[name] is calling back for an update for the patient medication. Please advise

## 2020-10-28 NOTE — Telephone Encounter (Signed)
Pt is calling to confirm that it is OK to speak with the [facility] home nurse in regards to his medications

## 2020-10-30 DIAGNOSIS — G931 Anoxic brain damage, not elsewhere classified: Secondary | ICD-10-CM | POA: Diagnosis not present

## 2020-10-30 DIAGNOSIS — Z48812 Encounter for surgical aftercare following surgery on the circulatory system: Secondary | ICD-10-CM | POA: Diagnosis not present

## 2020-10-30 DIAGNOSIS — Z951 Presence of aortocoronary bypass graft: Secondary | ICD-10-CM | POA: Diagnosis not present

## 2020-10-30 DIAGNOSIS — E119 Type 2 diabetes mellitus without complications: Secondary | ICD-10-CM | POA: Diagnosis not present

## 2020-10-30 DIAGNOSIS — I119 Hypertensive heart disease without heart failure: Secondary | ICD-10-CM | POA: Diagnosis not present

## 2020-10-30 DIAGNOSIS — I350 Nonrheumatic aortic (valve) stenosis: Secondary | ICD-10-CM | POA: Diagnosis not present

## 2020-10-30 DIAGNOSIS — I700 Atherosclerosis of aorta: Secondary | ICD-10-CM | POA: Diagnosis not present

## 2020-10-30 DIAGNOSIS — Z9181 History of falling: Secondary | ICD-10-CM | POA: Diagnosis not present

## 2020-10-30 DIAGNOSIS — I2510 Atherosclerotic heart disease of native coronary artery without angina pectoris: Secondary | ICD-10-CM | POA: Diagnosis not present

## 2020-10-30 DIAGNOSIS — M47812 Spondylosis without myelopathy or radiculopathy, cervical region: Secondary | ICD-10-CM | POA: Diagnosis not present

## 2020-10-30 DIAGNOSIS — Z7984 Long term (current) use of oral hypoglycemic drugs: Secondary | ICD-10-CM | POA: Diagnosis not present

## 2020-10-30 DIAGNOSIS — N179 Acute kidney failure, unspecified: Secondary | ICD-10-CM | POA: Diagnosis not present

## 2020-10-30 DIAGNOSIS — Z7982 Long term (current) use of aspirin: Secondary | ICD-10-CM | POA: Diagnosis not present

## 2020-10-30 DIAGNOSIS — E785 Hyperlipidemia, unspecified: Secondary | ICD-10-CM | POA: Diagnosis not present

## 2020-10-30 DIAGNOSIS — J9601 Acute respiratory failure with hypoxia: Secondary | ICD-10-CM | POA: Diagnosis not present

## 2020-11-02 ENCOUNTER — Telehealth: Payer: Self-pay | Admitting: Cardiovascular Disease

## 2020-11-02 DIAGNOSIS — E119 Type 2 diabetes mellitus without complications: Secondary | ICD-10-CM | POA: Diagnosis not present

## 2020-11-02 DIAGNOSIS — Z48812 Encounter for surgical aftercare following surgery on the circulatory system: Secondary | ICD-10-CM | POA: Diagnosis not present

## 2020-11-02 DIAGNOSIS — I700 Atherosclerosis of aorta: Secondary | ICD-10-CM | POA: Diagnosis not present

## 2020-11-02 DIAGNOSIS — I350 Nonrheumatic aortic (valve) stenosis: Secondary | ICD-10-CM | POA: Diagnosis not present

## 2020-11-02 DIAGNOSIS — I119 Hypertensive heart disease without heart failure: Secondary | ICD-10-CM | POA: Diagnosis not present

## 2020-11-02 DIAGNOSIS — I2510 Atherosclerotic heart disease of native coronary artery without angina pectoris: Secondary | ICD-10-CM | POA: Diagnosis not present

## 2020-11-02 NOTE — Telephone Encounter (Signed)
Spoke with home health nurse Beth with [facility]. She states that this morning when she went out to see the patient she noticed his heart seemed out of rhythm. She had him do an EKG on his apple watch which showed up inconclusive however she thinks that it is A Fib. She states that the patient reported that he has been recording EKGs almost daily and he has gotten several that have come up inconclusive. She reviewed past readings and it appears that he has been in and out of A fib since 7/29. She reports he is not having any new symptoms.  Blood pressure has been 140-150/80s. His heart rate was 72 this morning. She states that per apple watch rate will drop into 60s at times.  It looks like patient was in A fib for a bit when he was in the hospital and was given amiodarone. He has not had any episodes since.  Patient is only on ASA 325 mg daily.

## 2020-11-02 NOTE — Telephone Encounter (Signed)
Patient c/o Palpitations:  High priority if patient c/o lightheadedness, shortness of breath, or chest pain  How long have you had palpitations/irregular HR/ Afib? Are you having the symptoms now? yes  Are you currently experiencing lightheadedness, SOB or CP? no  Do you have a history of afib (atrial fibrillation) or irregular heart rhythm? yes  Have you checked your BP or HR? (document readings if available): HR 70s  BP: 156/96   Patient's states bp been high range from 140s-150s/80-90s  Are you experiencing any other symptoms? no

## 2020-11-03 ENCOUNTER — Other Ambulatory Visit (HOSPITAL_BASED_OUTPATIENT_CLINIC_OR_DEPARTMENT_OTHER): Payer: Self-pay | Admitting: Family

## 2020-11-04 NOTE — Telephone Encounter (Signed)
Beth from [facility] called. She was under the impression that the office would arrange a visit for the patient to come in and be evaluated and let the patient know. The Nurse spoke with the patient's wife this morning and they have not been contacted by the office yet.  Please reach out to the patient to follow up

## 2020-11-05 NOTE — Telephone Encounter (Signed)
   Patient c/o Palpitations:  High priority if patient c/o lightheadedness, shortness of breath, or chest pain  How long have you had palpitations/irregular HR/ Afib? Are you having the symptoms now? no  Are you currently experiencing lightheadedness, SOB or CP? No   Do you have a history of afib (atrial fibrillation) or irregular heart rhythm? yes  Have you checked your BP or HR? (document readings if available):   Yes, the patient has a log of them but he is still sleeping  Are you experiencing any other symptoms? fatigue  Ginger, wife of the patient called to follow up. The wife is aware that the patient is scheduled for 11/19/20 but is wondering if he needs to come in sooner.

## 2020-11-05 NOTE — Telephone Encounter (Signed)
Spoke with wife. No changes other than fatigue and irregular heart rate.  Wife states he has been checking with  his apple watch.  Appointment was been moved up to 11/08/20 at 9:15 with  J.Cleaver NP.  Wife states unable to print information from watch , but patient will bring watch to show strip.  Wife is aware the 11/19/20 appointment has not been schedule in case it is needed

## 2020-11-07 NOTE — Progress Notes (Signed)
Cardiology Clinic Note   Patient Name: Charan Nuzzi Date of Encounter: 11/08/2020  Primary Care Provider:  Seward Carol, MD Primary Cardiologist:  Shelva Majestic, MD  Patient Profile    Bernardo Jodoin 75 year old male presents the clinic today for an evaluation of atrial fibrillation.  Past Medical History    Past Medical History:  Diagnosis Date   COVID-19    received Mab 01/2020   Diabetes mellitus without complication ([place])    Heart murmur    Hypertension    Mild aortic stenosis    Past Surgical History:  Procedure Laterality Date   CORONARY ARTERY BYPASS GRAFT N/A 09/23/2020   Procedure: CORONARY ARTERY BYPASS GRAFTING (CABG), ON PUMP, TIMES THREE, USING LEFT INTERNAL MAMMARY ARTERY AND LEFT ENDOSCOPICALLY HARVESTED GREATER SAPHENOUS VEIN;  Surgeon: [name], MD;  Location: [place];  Service: Open Heart Surgery;  Laterality: N/A;   ENDOVEIN HARVEST OF GREATER SAPHENOUS VEIN Left 09/23/2020   Procedure: ENDOVEIN HARVEST OF GREATER SAPHENOUS VEIN;  Surgeon: [name], MD;  Location: [place];  Service: Open Heart Surgery;  Laterality: Left;   LEFT HEART CATH AND CORONARY ANGIOGRAPHY N/A 09/20/2020   Procedure: LEFT HEART CATH AND CORONARY ANGIOGRAPHY;  Surgeon: Nelva Bush, MD;  Location: [facility];  Service: Cardiovascular;  Laterality: N/A;   TEE WITHOUT CARDIOVERSION N/A 09/23/2020   Procedure: TRANSESOPHAGEAL ECHOCARDIOGRAM (TEE);  Surgeon: [name], MD;  Location: [place];  Service: Open Heart Surgery;  Laterality: N/A;    Allergies  No Known Allergies  History of Present Illness    Alcindor Eliassen has a PMH of HTN, HLD, DM2, VT/VF arrest, and coronary artery disease status post CABG x3 09/23/2020 (LIMA-LAD, SVG-PDA, SVG-diagonal).  During that time he was noted to have mild confusion due to his recent anoxic encephalopathy.  He was easily reoriented.  He was noted to have atrial fibrillation postoperatively and was treated  with amiodarone.  He was discharged with a LifeVest.  He was noted to have significant lower extremity swelling and his home health nurse contacted the office.  A lower extremity ultrasound was noted and was negative for DVT.  He was seen by Laurann Montana, NP-C on 10/12/2020.  During that time he presented with his wife.  He reported that he was feeling better since discharge and was walking regularly.  He was wearing his LifeVest.  He indicated that he had minimal incisional soreness.  He denied shortness of breath and DOE.  He had no chest pain, pressure or tightness.  He denied orthopnea and PND.  He reported he was unable to tolerate compression stockings.  His blood pressure at home had been running in the [id number] systolic range.  His losartan was stopped and he was started on Entresto 2426.  On 11/02/2020 and nurse from [facility] contacted nurse triage line and reported that she felt the patient had been having periods of atrial fibrillation.  His EKGs from his apple watch are showing inconclusive results on his EKG readings.  He presents to the clinic today for follow-up evaluation (with his wife) states he does not notice any irregular heartbeats or palpitations.  He has been walking about 3 times per week and does note increased dyspnea with increased physical activity such as walking up an incline.  However, he reports that his breathing returns to normal after he continues on level ground.  We reviewed his apple watch EKGs.  His EKGs show sinus arrhythmia with PVCs.  He reports that he  has been staying well-hydrated.  We will order a 14-day cardiac event monitor, order CBC, BMP, magnesium, and TSH.  I will have him return for follow-up evaluation and 8 weeks.  I have also instructed him to avoid triggers caffeine, chocolate, EtOH, dehydration etc.  Today he is denies chest pain, shortness of breath, lower extremity edema, fatigue, palpitations, melena, hematuria, hemoptysis, diaphoresis, weakness,  presyncope, syncope, orthopnea, and PND.   Home Medications    Prior to Admission medications   Medication Sig Start Date End Date Taking? Authorizing Provider  acetaminophen (TYLENOL) 500 MG tablet Take 2 tablets (1,000 mg total) by mouth every 6 (six) hours as needed. 09/28/20   Nani Skillern, PA-C  aspirin EC 325 MG EC tablet Take 1 tablet (325 mg total) by mouth daily. 09/28/20   Nani Skillern, PA-C  atenolol (TENORMIN) 50 MG tablet Take 1 tablet (50 mg total) by mouth daily. 09/28/20   Nani Skillern, PA-C  diphenhydrAMINE (BENADRYL) 25 mg capsule Take 25 mg by mouth every 6 (six) hours as needed for allergies or sleep.    [provider]  diphenhydramine-acetaminophen (TYLENOL PM) 25-500 MG TABS tablet Take 1-2 tablets by mouth at bedtime as needed (for sleep).    [provider]  furosemide (LASIX) 20 MG tablet TAKE 1 TABLET BY MOUTH PRN for weight gain 2 lbs overnight or 5 lbs in one week or edema 10/28/20   Loel Dubonnet, NP  metFORMIN (GLUCOPHAGE) 500 MG tablet Take 500 mg by mouth 2 (two) times daily with a meal.    [provider]  Multiple Vitamins-Minerals (PRESERVISION AREDS 2) CAPS Take 1 capsule by mouth 2 (two) times daily.    [provider]  potassium chloride SA (KLOR-CON) 20 MEQ tablet TAKE 1 TABLET BY MOUTH DAILY FOR 5 DAYS THEN HOLD 10/12/20   Loel Dubonnet, NP  sacubitril-valsartan (ENTRESTO) 24-26 MG Take 1 tablet by mouth 2 (two) times daily. 10/12/20   Loel Dubonnet, NP  sertraline (ZOLOFT) 50 MG tablet Take 50 mg by mouth in the morning.    [provider]  traMADol (ULTRAM) 50 MG tablet Take 1 tablet (50 mg total) by mouth every 6 (six) hours as needed for moderate pain. 09/28/20   Nani Skillern, PA-C    Family History    Family History  Problem Relation Age of Onset   Cancer Mother    Cancer Father    CAD Neg Hx    He indicated that the status of his mother is unknown. He indicated  that the status of his father is unknown. He indicated that the status of his neg hx is unknown.  Social History    Social History   Socioeconomic History   Marital status: Married    Spouse name: Not on file   Number of children: Not on file   Years of education: Not on file   Highest education level: Not on file  Occupational History   Not on file  Tobacco Use   Smoking status: Never   Smokeless tobacco: Never  Substance and Sexual Activity   Alcohol use: Not on file   Drug use: Not on file   Sexual activity: Not on file  Other Topics Concern   Not on file  Social History Narrative   Not on file   Social Determinants of Health   Financial Resource Strain: Not on file  Food Insecurity: Not on file  Transportation Needs: Not on file  Physical Activity: Not on file  Stress: Not on file  Social Connections: Not on file  Intimate Partner Violence: Not on file     Review of Systems    General:  No chills, fever, night sweats or weight changes.  Cardiovascular:  No chest pain, dyspnea on exertion, edema, orthopnea, palpitations, paroxysmal nocturnal dyspnea. Dermatological: No rash, lesions/masses Respiratory: No cough, dyspnea Urologic: No hematuria, dysuria Abdominal:   No nausea, vomiting, diarrhea, bright red blood per rectum, melena, or hematemesis Neurologic:  No visual changes, wkns, changes in mental status. All other systems reviewed and are otherwise negative except as noted above.  Physical Exam    VS:  BP 132/72   Pulse 67   Resp 18   Ht '5\' 9"'$  (1.753 m)   Wt 193 lb (87.5 kg)   SpO2 99%   BMI 28.50 kg/m  , BMI Body mass index is 28.5 kg/m. GEN: Well nourished, well developed, in no acute distress. HEENT: normal. Neck: Supple, no JVD, carotid bruits, or masses. Cardiac: RRR, no murmurs, rubs, or gallops. No clubbing, cyanosis, edema.  Radials/DP/PT 2+ and equal bilaterally.  Respiratory:  Respirations regular and unlabored, clear to auscultation  bilaterally. GI: Soft, nontender, nondistended, BS + x 4. MS: no deformity or atrophy. Skin: warm and dry, no rash. Neuro:  Strength and sensation are intact. Psych: Normal affect.  Accessory Clinical Findings    Recent Labs: 09/17/2020: TSH 1.755 09/24/2020: Magnesium 2.8 10/12/2020: ALT 15; BUN 13; Creatinine, Ser 0.86; Hemoglobin 10.5; Platelets 260; Potassium 4.3; Sodium 138   Recent Lipid Panel    Component Value Date/Time   CHOL 141 09/18/2020 0023   TRIG 96 09/18/2020 0023   HDL 40 (L) 09/18/2020 0023   CHOLHDL 3.5 09/18/2020 0023   VLDL 19 09/18/2020 0023   LDLCALC 82 09/18/2020 0023    ECG personally reviewed by me today- sinus rhythm with sinus arrhythmia with occasional premature ventricular complexes septal infarct undetermined age ST and T wave abnormality consider anterior lateral ischemia 67 bpm. - No acute changes  Echocardiogram 09/17/2020 IMPRESSIONS     1. Mild hypokinesis of the distal septum with overall preserved LV  function; calcified aortic valve with mild AS by doppler but visually  looks moderate.   2. Left ventricular ejection fraction, by estimation, is 55 to 60%. The  left ventricle has normal function. The left ventricle demonstrates  regional wall motion abnormalities (see scoring diagram/findings for  description). There is mild left ventricular   hypertrophy. Left ventricular diastolic parameters are consistent with  Grade II diastolic dysfunction (pseudonormalization). Elevated left atrial  pressure.   3. Right ventricular systolic function is normal. The right ventricular  size is normal.   4. The mitral valve is normal in structure. Trivial mitral valve  regurgitation. No evidence of mitral stenosis. Moderate mitral annular  calcification.   5. The aortic valve is calcified. Aortic valve regurgitation is not  visualized. Mild aortic valve stenosis.   6. The inferior vena cava is normal in size with greater than 50%  respiratory  variability, suggesting right atrial pressure of 3 mmHg.  Conclusions: Severe three-vessel coronary artery disease, including chronic total occlusion of proximal LAD with left-to-left collaterals, 75% ostial/proximal ramus intermedius stenosis, and sequential 75% ostial and 90% distal RCA lesions.  LCx and OM3 have mild to moderate disease of up to 40%. Moderately elevated left ventricular filling pressure (LVEDP ~30 mmHg). Mild aortic stenosis (peak-to-peak gradient 10-15 mmHg.   Recommendations: Imaged reviewed with Dr. Burt Knack  during the procedure.  We will plan for cardiac surgery consultation for CABG given severe LAD and RCA disease with good distal targets in both vessels. Defer reinitiation of IV heparin, as the coronary artery disease appears chronic and hemoglobin has trended down significantly since admission. Aggressive secondary prevention. Diurese with furosemide 40 mg IV x 1.  Further diureses to be based on urine output, volume status, and renal function.   Nelva Bush, MD [facility]  Diagnostic Dominance: Right Intervention   Assessment & Plan   1.  Irregular heartbeat-EKG today shows sinus rhythm with sinus arrhythmia with occasional premature ventricular complexes septal infarct undetermined age ST and T wave abnormality consider anterior lateral ischemia 67 bpm.  Home health nurse contacted nurse triage line and reported that he was having episodes of inconclusive apple watch EKGs.  Also had increased fatigue.  Noted to have postoperative atrial fibrillation that had been treated with amiodarone. Order 14-day cardiac event monitor Continue aspirin Avoid triggers caffeine, chocolate, EtOH, dehydration etc. Order CBC, BMP, magnesium, TSH  Coronary artery disease-no chest particular.  Status post CABG x3.  Details above.  Continues to slowly increase physical activity. Continue aspirin, atenolol, Entresto Heart healthy low-sodium diet-salty 6 given Increase  physical activity as tolerated  Chronic systolic heart failure-no increased DOE activity tolerance.  Continues with some fatigue.  Intraoperative TEE showed EF 35-40% Continue Entresto, furosemide, potassium Heart healthy low-sodium diet-salty 6 given Increase physical activity as tolerated Repeat echocardiogram next medication has been optimized x1 month.  Hyperlipidemia-09/18/2020: Cholesterol 141; HDL 40; LDL Cholesterol 82; Triglycerides 96; VLDL 19 100 to have postoperative liver shock with transaminitis.  Follow-up AST ALT showed normalized lab values. Continue aspirin Resume Lipitor 40 mg daily Heart healthy low-sodium diet-salty 6 given Increase physical activity as tolerated Repeat fasting lipids and LFTs  Essential hypertension-BP today 132/72.  Well-controlled at home. Continue Entresto 24-26 Heart healthy low-sodium diet-salty 6 given Increase physical activity as tolerated  Disposition: Follow-up with Dr. Claiborne Billings in 8 weeks.  Jossie Ng. Zora Glendenning NP-C    11/08/2020, 9:47 AM [place] [address] Office [phone] Fax [phone]  Notice: This dictation was prepared with Dragon dictation along with smaller phrase technology. Any transcriptional errors that result from this process are unintentional and may not be corrected upon review.  I spent 14 minutes examining this patient, reviewing medications, and using patient centered shared decision making involving her cardiac care.  Prior to her visit I spent greater than 20 minutes reviewing her past medical history,  medications, and prior cardiac tests.

## 2020-11-08 ENCOUNTER — Encounter: Payer: Self-pay | Admitting: General Practice

## 2020-11-08 ENCOUNTER — Ambulatory Visit (INDEPENDENT_AMBULATORY_CARE_PROVIDER_SITE_OTHER): Payer: Medicare Other | Admitting: General Practice

## 2020-11-08 ENCOUNTER — Other Ambulatory Visit: Payer: Self-pay

## 2020-11-08 VITALS — BP 132/72 | HR 67 | Resp 18 | Ht 69.0 in | Wt 193.0 lb

## 2020-11-08 DIAGNOSIS — E785 Hyperlipidemia, unspecified: Secondary | ICD-10-CM | POA: Diagnosis not present

## 2020-11-08 DIAGNOSIS — I25118 Atherosclerotic heart disease of native coronary artery with other forms of angina pectoris: Secondary | ICD-10-CM

## 2020-11-08 DIAGNOSIS — R002 Palpitations: Secondary | ICD-10-CM | POA: Diagnosis not present

## 2020-11-08 DIAGNOSIS — I10 Essential (primary) hypertension: Secondary | ICD-10-CM | POA: Diagnosis not present

## 2020-11-08 DIAGNOSIS — I5042 Chronic combined systolic (congestive) and diastolic (congestive) heart failure: Secondary | ICD-10-CM

## 2020-11-08 DIAGNOSIS — I499 Cardiac arrhythmia, unspecified: Secondary | ICD-10-CM | POA: Diagnosis not present

## 2020-11-08 DIAGNOSIS — Z79899 Other long term (current) drug therapy: Secondary | ICD-10-CM

## 2020-11-08 LAB — CBC
Hematocrit: 34.8 % — ABNORMAL LOW (ref 37.5–51.0)
Hemoglobin: 11.2 g/dL — ABNORMAL LOW (ref 13.0–17.7)
MCH: 28.1 pg (ref 26.6–33.0)
MCHC: 32.2 g/dL (ref 31.5–35.7)
MCV: 87 fL (ref 79–97)
Platelets: 202 10*3/uL (ref 150–450)
RBC: 3.99 x10E6/uL — ABNORMAL LOW (ref 4.14–5.80)
RDW: 13.1 % (ref 11.6–15.4)
WBC: 6.7 10*3/uL (ref 3.4–10.8)

## 2020-11-08 LAB — MAGNESIUM: Magnesium: 1.7 mg/dL (ref 1.6–2.3)

## 2020-11-08 LAB — BASIC METABOLIC PANEL
BUN/Creatinine Ratio: 16 (ref 10–24)
BUN: 14 mg/dL (ref 8–27)
CO2: 25 mmol/L (ref 20–29)
Calcium: 9.4 mg/dL (ref 8.6–10.2)
Chloride: 102 mmol/L (ref 96–106)
Creatinine, Ser: 0.87 mg/dL (ref 0.76–1.27)
Glucose: 87 mg/dL (ref 65–99)
Potassium: 5 mmol/L (ref 3.5–5.2)
Sodium: 141 mmol/L (ref 134–144)
eGFR: 89 mL/min/{1.73_m2} (ref >59)

## 2020-11-08 LAB — TSH: TSH: 2.11 u[IU]/mL (ref 0.450–4.500)

## 2020-11-08 NOTE — Patient Instructions (Addendum)
Medication Instructions:  The current medical regimen is effective;  continue present plan and medications as directed. Please refer to the Current Medication list given to you today.  *If you need a refill on your cardiac medications before your next appointment, please call your pharmacy*  Lab Work: MAG,CBC,BMET AND TSH TODAY If you have labs (blood work) drawn today and your tests are completely normal, you will receive your results only by:  [place] (if you have MyChart) OR A paper copy in the mail.  If you have any lab test that is abnormal or we need to change your treatment, we will call you to review the results. You may go to any [facility] that is convenient for you however, we do have a lab in our office that is able to assist you. You DO NOT need an appointment for our lab. The lab is open 8:00am and closes at 4:00pm. Lunch 12:45 - 1:45pm.  Testing/Procedures: Your physician has recommended that you wear an event monitor 14 day. Event monitors are medical devices that record the heart's electrical activity. Doctors most often [place] these monitors to diagnose arrhythmias. Arrhythmias are problems with the speed or rhythm of the heartbeat. The monitor is a small, portable device. You can wear one while you do your normal daily activities. This is usually used to diagnose what is causing palpitations/syncope (passing out). This monitor will be mailed to you.  Special Instructions Please try to avoid these triggers: Do not use any products that have nicotine or tobacco in them. These include cigarettes, e-cigarettes, and chewing tobacco. If you need help quitting, ask your doctor. Eat heart-healthy foods. Talk with your doctor about the right eating plan for you. Exercise regularly as told by your doctor. Stay hydrated Do not drink alcohol, Caffeine or chocolate. Lose weight if you are overweight. Do not use drugs, including cannabis    PLEASE READ AND FOLLOW SALTY 6-ATTACHED-1,800 mg  daily  PLEASE INCREASE HYDRATION  Follow-Up: Your next appointment:  8 week(s) In Person with Shelva Majestic, MD OR IF UNAVAILABLE Jal, FNP-C   At [facility], you and your health needs are our priority.  As part of our continuing mission to provide you with exceptional heart care, we have created designated Provider Care Teams.  These Care Teams include your primary Cardiologist (physician) and Advanced Practice Providers (APPs -  Physician Assistants and Nurse Practitioners) who all work together to provide you with the care you need, when you need it.

## 2020-11-09 ENCOUNTER — Encounter (HOSPITAL_BASED_OUTPATIENT_CLINIC_OR_DEPARTMENT_OTHER): Payer: Self-pay

## 2020-11-09 DIAGNOSIS — I700 Atherosclerosis of aorta: Secondary | ICD-10-CM | POA: Diagnosis not present

## 2020-11-09 DIAGNOSIS — I350 Nonrheumatic aortic (valve) stenosis: Secondary | ICD-10-CM | POA: Diagnosis not present

## 2020-11-09 DIAGNOSIS — E119 Type 2 diabetes mellitus without complications: Secondary | ICD-10-CM | POA: Diagnosis not present

## 2020-11-09 DIAGNOSIS — I2510 Atherosclerotic heart disease of native coronary artery without angina pectoris: Secondary | ICD-10-CM | POA: Diagnosis not present

## 2020-11-09 DIAGNOSIS — I119 Hypertensive heart disease without heart failure: Secondary | ICD-10-CM | POA: Diagnosis not present

## 2020-11-09 DIAGNOSIS — Z48812 Encounter for surgical aftercare following surgery on the circulatory system: Secondary | ICD-10-CM | POA: Diagnosis not present

## 2020-11-10 ENCOUNTER — Telehealth (HOSPITAL_COMMUNITY): Payer: Self-pay | Admitting: *Deleted

## 2020-11-10 ENCOUNTER — Telehealth (HOSPITAL_COMMUNITY): Payer: Self-pay

## 2020-11-10 NOTE — Telephone Encounter (Signed)
Pt updated with NP's recommendations and verbalized understanding. No samples available today but pt report he has a few days left and will call back next week to check again. Pt assistance application placed in the mail.   Recommendations from Laurann Montana, NP  Can we start the patient assistance process for Mr. Brian Robinson? If we have samples available, we can provide those. Otherwise if not available, transition to Losartan '25mg'$  QD while patient assistance is in process.   Best,  [employer]

## 2020-11-10 NOTE — Telephone Encounter (Signed)
Called and spoke with pt wife about pt scheduling for cardiac rehab, pt has a echo scheduled for 10/3 and the nurse navigator wanted pt to have that first before we schedule considering we are scheduling our to the end of September. Per pt's wife Clever NP's office they canceled the echo, the nurse navigator will call the office to see if pt is able to participate in the cardiac rehab program.

## 2020-11-10 NOTE — Telephone Encounter (Signed)
Called and spoke to pt confirming that the echo scheduled for 10/3 has not been cancelled.  He should complete this so that a determination can be made regarding the need for an internal defibrillator or the removal of his life vest.  Pt verbalized understanding and ask for contact number so that his wife who is not a home presently can call so that she understands. Cherre Huger, BSN Cardiac and Training and development officer

## 2020-11-12 ENCOUNTER — Telehealth: Payer: Self-pay | Admitting: General Practice

## 2020-11-12 NOTE — Telephone Encounter (Signed)
   Pt's wife said they need help where to put the heart monitor best since pt has some stitched on his chest from recent procedure

## 2020-11-15 ENCOUNTER — Ambulatory Visit (INDEPENDENT_AMBULATORY_CARE_PROVIDER_SITE_OTHER): Payer: Medicare Other

## 2020-11-15 ENCOUNTER — Telehealth: Payer: Self-pay | Admitting: Cardiovascular Disease

## 2020-11-15 ENCOUNTER — Ambulatory Visit: Payer: Medicare Other | Admitting: General Practice

## 2020-11-15 DIAGNOSIS — R002 Palpitations: Secondary | ICD-10-CM

## 2020-11-15 DIAGNOSIS — I499 Cardiac arrhythmia, unspecified: Secondary | ICD-10-CM

## 2020-11-15 MED ORDER — ATENOLOL 50 MG PO TABS: 50.0000 mg | ORAL_TABLET | Freq: Every day | ORAL | 2 refills | Status: DC

## 2020-11-15 NOTE — Telephone Encounter (Signed)
Returned call to Pt's wife.  Advised to move HM to the left far enough so that it is not on surgical incision.  Pt's wife understood.  No further issues at this time.

## 2020-11-15 NOTE — Telephone Encounter (Signed)
  Are you calling in reference to your FMLA or disability form? Assistance paperwork for entresto   What is your question in regards to FMLA or disability form? Pt never received the paperwork    Do you need copies of your medical records? No   Are you waiting on a nurse to call you back with results or are you wanting copies of your results? Yes he is wanting the nurse to call him and let him know what needs to be done     Please route to Medical Records or your medical records site representative

## 2020-11-15 NOTE — Telephone Encounter (Signed)
   Pt's wife calling back to f/u, she is now wants to speak with Denyse Amass or [place]. She also, sent a mychart message

## 2020-11-15 NOTE — Telephone Encounter (Signed)
Called Mr. Lambrecht to discuss Delene Loll and was able to talk with his wife, Ginger, per DPR.   Discussed that I will leave a bottle of Entresto 24-'26mg'$  samples at the desk at our [place] location. She is agreeable to pick up this week. This will provide 14 day supply of Entresto. If he runs out prior to patient assistance being approved, he will transition to Losartan '25mg'$  QD which he has at home.   I have filled out patient assistance paperwork provider portion and he will sign it while he is here picking up samples.   Also notes some difficulties with his heart rate. Coletta Memos, PA ordered a 14 day event monitor. He just put on the [employer] monitor a few days ago. He is almost out of his Atenolol - refill sent to preferred pharmacy.  Loel Dubonnet, NP

## 2020-11-16 ENCOUNTER — Telehealth: Payer: Self-pay | Admitting: *Deleted

## 2020-11-16 DIAGNOSIS — Z48812 Encounter for surgical aftercare following surgery on the circulatory system: Secondary | ICD-10-CM | POA: Diagnosis not present

## 2020-11-16 DIAGNOSIS — I2510 Atherosclerotic heart disease of native coronary artery without angina pectoris: Secondary | ICD-10-CM | POA: Diagnosis not present

## 2020-11-16 DIAGNOSIS — I350 Nonrheumatic aortic (valve) stenosis: Secondary | ICD-10-CM | POA: Diagnosis not present

## 2020-11-16 DIAGNOSIS — I119 Hypertensive heart disease without heart failure: Secondary | ICD-10-CM | POA: Diagnosis not present

## 2020-11-16 DIAGNOSIS — I700 Atherosclerosis of aorta: Secondary | ICD-10-CM | POA: Diagnosis not present

## 2020-11-16 DIAGNOSIS — E119 Type 2 diabetes mellitus without complications: Secondary | ICD-10-CM | POA: Diagnosis not present

## 2020-11-16 NOTE — Telephone Encounter (Signed)
Confirmed with Brian Robinson they have successfully applied the strip to the upper left chest.  Confirmed they will not require lead wires for the strip set up.  Answered questions on how to select symptoms, and if in error , how to unselect symptoms when sending a recording.

## 2020-11-17 ENCOUNTER — Telehealth: Payer: Self-pay | Admitting: Cardiovascular Disease

## 2020-11-17 NOTE — Telephone Encounter (Signed)
Attempted phone call to pt.  Per Epic OK to leave detailed message. Pt advised per Coletta Memos,   His symptoms appear to be related to his heart function.  His symptoms should improve with starting back on his Entresto medication.  We will continue to monitor at this time.  Please continue his current medication regimen and have him continue to eat a low-sodium diet, weigh himself daily, and continue to slowly increase his physical activity.  We will continue with his current treatment plan.  Thank you.   Jossie Ng. [place] NP-C   Pt advised if he has further questions or concerns please call [phone].

## 2020-11-17 NOTE — Telephone Encounter (Signed)
Spoke with pt who complains of SOB walking up a hill or incline over the past week.  He does confirm he is able to continue after resting for a bit.  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  He denies any chest pain or edema.  BP today is 141/85 and HR 71.  Pt resumed Entresto 24-26 yesterday.   Pt advised to continue current medications.  Appointment 10/3 for echo and 10/26 f/u with Annamaria Boots.  Will forward to Dr Claiborne Billings and Annamaria Boots for review and recommendation.

## 2020-11-17 NOTE — Telephone Encounter (Signed)
went to see the pt yesterday, pt complaining of sob  when walking up hills, he can normally rest for a min and start back again. Blood pressure high 142/94.. diastolic in the [id number] for the past week. Pt has been off Entresto for 5 days until receiving samples yesterday.. hr is also irregular.

## 2020-11-19 ENCOUNTER — Ambulatory Visit: Payer: Medicare Other | Admitting: Physician Assistant

## 2020-11-19 ENCOUNTER — Telehealth: Payer: Self-pay | Admitting: Cardiology

## 2020-11-19 DIAGNOSIS — R609 Edema, unspecified: Secondary | ICD-10-CM

## 2020-11-19 NOTE — Telephone Encounter (Signed)
Pt's wife called for Lt lower ext edema.  This is his leg that swells.  He had venous doppler for similar episode and no VTE.  He has no SOB.  He will take lasix 20 mg daily for 3 days along with kdur 20 meq daily for 3 days.   Will need BMP next week.  Will ask triage to arrange.  He will call if edema increases or is no better.    Triage can you arrange for him to have BMP for medication changes. On Monday or Tuesday.  Thanks.

## 2020-11-22 NOTE — Telephone Encounter (Signed)
Called and spoke to patient. He will come in for BMET tomorrow.

## 2020-11-23 DIAGNOSIS — R609 Edema, unspecified: Secondary | ICD-10-CM | POA: Diagnosis not present

## 2020-11-23 LAB — BASIC METABOLIC PANEL
BUN/Creatinine Ratio: 14 (ref 10–24)
BUN: 11 mg/dL (ref 8–27)
CO2: 26 mmol/L (ref 20–29)
Calcium: 9.5 mg/dL (ref 8.6–10.2)
Chloride: 99 mmol/L (ref 96–106)
Creatinine, Ser: 0.8 mg/dL (ref 0.76–1.27)
Glucose: 108 mg/dL — ABNORMAL HIGH (ref 65–99)
Potassium: 4.3 mmol/L (ref 3.5–5.2)
Sodium: 139 mmol/L (ref 134–144)
eGFR: 92 mL/min/{1.73_m2} (ref >59)

## 2020-11-24 ENCOUNTER — Telehealth: Payer: Self-pay | Admitting: Cardiovascular Disease

## 2020-11-24 DIAGNOSIS — Z48812 Encounter for surgical aftercare following surgery on the circulatory system: Secondary | ICD-10-CM | POA: Diagnosis not present

## 2020-11-24 DIAGNOSIS — I2510 Atherosclerotic heart disease of native coronary artery without angina pectoris: Secondary | ICD-10-CM | POA: Diagnosis not present

## 2020-11-24 DIAGNOSIS — I119 Hypertensive heart disease without heart failure: Secondary | ICD-10-CM | POA: Diagnosis not present

## 2020-11-24 DIAGNOSIS — E119 Type 2 diabetes mellitus without complications: Secondary | ICD-10-CM | POA: Diagnosis not present

## 2020-11-24 DIAGNOSIS — I350 Nonrheumatic aortic (valve) stenosis: Secondary | ICD-10-CM | POA: Diagnosis not present

## 2020-11-24 DIAGNOSIS — I700 Atherosclerosis of aorta: Secondary | ICD-10-CM | POA: Diagnosis not present

## 2020-11-24 MED ORDER — ENTRESTO 24-26 MG PO TABS: 1.0000 | ORAL_TABLET | Freq: Two times a day (BID) | ORAL | 1 refills | Status: DC

## 2020-11-24 NOTE — Telephone Encounter (Signed)
Beth with [facility] is returning call.

## 2020-11-24 NOTE — Telephone Encounter (Signed)
Beth from [facility] wanted to talk to Dr. Evette Georges Nurse to give an update on this patient. Today was her last home visit with the patient

## 2020-11-24 NOTE — Telephone Encounter (Signed)
Returned call to [place] from [facility], she states that pt has no swelling on the RLE. Pt has trace swelling on the right inner calf, but inside below the knee above the harvesting site (which looks good) pt has "some kind of marking" (injection site??) none of the doctors know what this is.Beth thinks there "is just a little infection there".  Pt is wearing life vest and monitor. Monitor comes off Sunday. Pt is asking how much longer does he have to wear this? Pt has been having intermittent paroxysmal afib w/PVC's on his [facility]. "One day it will be sinus the the next it will be multiple afib episodes.BP is intermittently high from 150-190's/90's then will go back down 148/80's so, it is all over the place. Otherwise denies fatigue just DOE walking/incline. No SOB. Do we want to bring him earlier or want to see the infection site (or send to PCP for this)?

## 2020-11-24 NOTE — Telephone Encounter (Signed)
LM2CB 

## 2020-11-25 NOTE — Telephone Encounter (Signed)
See result note also. Wife "Ginger" notified she will call PCP also to see if he would like to do anything. This note was also forwarded to PCP. Please advise

## 2020-11-26 ENCOUNTER — Other Ambulatory Visit: Payer: Self-pay | Admitting: Internal Medicine

## 2020-11-26 ENCOUNTER — Ambulatory Visit
Admission: RE | Admit: 2020-11-26 | Discharge: 2020-11-26 | Disposition: A | Discharge status: Home / Self Care | Payer: Medicare Other | Source: Ambulatory Visit | Attending: Internal Medicine | Admitting: Internal Medicine

## 2020-11-26 DIAGNOSIS — R600 Localized edema: Secondary | ICD-10-CM

## 2020-11-26 DIAGNOSIS — I5021 Acute systolic (congestive) heart failure: Secondary | ICD-10-CM | POA: Diagnosis not present

## 2020-11-30 DIAGNOSIS — Z79899 Other long term (current) drug therapy: Secondary | ICD-10-CM

## 2020-11-30 DIAGNOSIS — I10 Essential (primary) hypertension: Secondary | ICD-10-CM

## 2020-11-30 NOTE — Telephone Encounter (Signed)
Sent Mychart message.

## 2020-12-01 MED ORDER — ENTRESTO 49-51 MG PO TABS: 1.0000 | ORAL_TABLET | Freq: Two times a day (BID) | ORAL | 12 refills | Status: DC

## 2020-12-02 MED ORDER — ENTRESTO 49-51 MG PO TABS: 1.0000 | ORAL_TABLET | Freq: Two times a day (BID) | ORAL | 12 refills | Status: DC

## 2020-12-02 NOTE — Addendum Note (Signed)
Addended by: Waylan Rocher on: 12/02/2020 08:45 AM   Modules accepted: Orders

## 2020-12-07 ENCOUNTER — Other Ambulatory Visit: Payer: Self-pay

## 2020-12-07 ENCOUNTER — Telehealth: Payer: Self-pay | Admitting: General Practice

## 2020-12-07 DIAGNOSIS — I10 Essential (primary) hypertension: Secondary | ICD-10-CM | POA: Diagnosis not present

## 2020-12-07 DIAGNOSIS — Z79899 Other long term (current) drug therapy: Secondary | ICD-10-CM | POA: Diagnosis not present

## 2020-12-07 LAB — BASIC METABOLIC PANEL
BUN/Creatinine Ratio: 19 (ref 10–24)
BUN: 15 mg/dL (ref 8–27)
CO2: 27 mmol/L (ref 20–29)
Calcium: 9.4 mg/dL (ref 8.6–10.2)
Chloride: 100 mmol/L (ref 96–106)
Creatinine, Ser: 0.77 mg/dL (ref 0.76–1.27)
Glucose: 87 mg/dL (ref 65–99)
Potassium: 4.5 mmol/L (ref 3.5–5.2)
Sodium: 141 mmol/L (ref 134–144)
eGFR: 93 mL/min/{1.73_m2} (ref >59)

## 2020-12-07 NOTE — Telephone Encounter (Signed)
Patient was discharged on the 5th of July from [facility] hospital and was given a life vest and told to wear for 3 months , 3 months will end on October the 5th. The patient wants to know when he can stop wearing. Can he stop wearing on that day and ship back .   Please advise   Please reach out to patient

## 2020-12-13 DIAGNOSIS — I2510 Atherosclerotic heart disease of native coronary artery without angina pectoris: Secondary | ICD-10-CM | POA: Diagnosis not present

## 2020-12-13 DIAGNOSIS — I5040 Unspecified combined systolic (congestive) and diastolic (congestive) heart failure: Secondary | ICD-10-CM | POA: Diagnosis not present

## 2020-12-13 DIAGNOSIS — E7800 Pure hypercholesterolemia, unspecified: Secondary | ICD-10-CM | POA: Diagnosis not present

## 2020-12-13 DIAGNOSIS — R21 Rash and other nonspecific skin eruption: Secondary | ICD-10-CM | POA: Diagnosis not present

## 2020-12-13 DIAGNOSIS — Z951 Presence of aortocoronary bypass graft: Secondary | ICD-10-CM | POA: Diagnosis not present

## 2020-12-13 DIAGNOSIS — I350 Nonrheumatic aortic (valve) stenosis: Secondary | ICD-10-CM | POA: Diagnosis not present

## 2020-12-13 DIAGNOSIS — E1169 Type 2 diabetes mellitus with other specified complication: Secondary | ICD-10-CM | POA: Diagnosis not present

## 2020-12-15 DIAGNOSIS — R739 Hyperglycemia, unspecified: Secondary | ICD-10-CM | POA: Diagnosis not present

## 2020-12-15 DIAGNOSIS — D649 Anemia, unspecified: Secondary | ICD-10-CM | POA: Diagnosis not present

## 2020-12-17 DIAGNOSIS — L270 Generalized skin eruption due to drugs and medicaments taken internally: Secondary | ICD-10-CM | POA: Diagnosis not present

## 2020-12-17 DIAGNOSIS — L299 Pruritus, unspecified: Secondary | ICD-10-CM | POA: Diagnosis not present

## 2020-12-17 DIAGNOSIS — L309 Dermatitis, unspecified: Secondary | ICD-10-CM | POA: Diagnosis not present

## 2020-12-17 DIAGNOSIS — D649 Anemia, unspecified: Secondary | ICD-10-CM | POA: Diagnosis not present

## 2020-12-17 DIAGNOSIS — I776 Arteritis, unspecified: Secondary | ICD-10-CM | POA: Diagnosis not present

## 2020-12-23 ENCOUNTER — Telehealth: Payer: Self-pay | Admitting: Cardiovascular Disease

## 2020-12-23 DIAGNOSIS — T50905A Adverse effect of unspecified drugs, medicaments and biological substances, initial encounter: Secondary | ICD-10-CM | POA: Diagnosis not present

## 2020-12-23 DIAGNOSIS — L519 Erythema multiforme, unspecified: Secondary | ICD-10-CM | POA: Diagnosis not present

## 2020-12-23 NOTE — Telephone Encounter (Signed)
Returned call to pt he states that he was taking the lower dose of Entresto then 2 weeks ago this was increased to the higher dose(still taking 2-  24-26)then the next day this rash started it is "all over, chest, arms abdomen, stomach"   he states that he is still taking the Entresto. What should we do? Please advise

## 2020-12-23 NOTE — Telephone Encounter (Signed)
Pt c/o medication issue:  1. Name of Medication: sacubitril-valsartan (ENTRESTO) 49-51 MG  2. How are you currently taking this medication (dosage and times per day)? Take 1 tablet by mouth 2 (two) times daily.  3. Are you having a reaction (difficulty breathing--STAT)? no  4. What is your medication issue? Office called to say that the patient needs to be office this medication to do a reaction of having a rash. Stated that patient needs to be put on a different kind of message

## 2020-12-24 ENCOUNTER — Other Ambulatory Visit: Payer: Self-pay | Admitting: Physician Assistant

## 2020-12-24 NOTE — Telephone Encounter (Signed)
Does not appear initial message was routed to provider or triage team. He wore a 2 week monitor with preliminary review showing no VT/VF. 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 He has upcoming echocardiogram 12/27/20. So long as LVEF >35%, okay to discontinue LifeVest. If LVEF <35%, will reach out to LifeVest to extend order.   Loel Dubonnet, NP

## 2020-12-24 NOTE — Telephone Encounter (Signed)
Recommend patient hold Entresto through the weekend to see if rash resolves.  If it does, can restart lower dose 24-26mg  since patient did not have adverse reaction to that dosage.  If rash does not resolve, recommend patient see PCP

## 2020-12-24 NOTE — Telephone Encounter (Signed)
Pt WIFE informed of providers result & recommendations. Pt verbalized understanding. No further questions . She will cll back Monday 12-27-20 for results of stopping medicaton

## 2020-12-27 ENCOUNTER — Telehealth (HOSPITAL_BASED_OUTPATIENT_CLINIC_OR_DEPARTMENT_OTHER): Payer: Self-pay | Admitting: Family

## 2020-12-27 ENCOUNTER — Ambulatory Visit (HOSPITAL_COMMUNITY): Payer: Medicare Other | Attending: Cardiovascular Disease

## 2020-12-27 ENCOUNTER — Other Ambulatory Visit: Payer: Self-pay

## 2020-12-27 DIAGNOSIS — Z951 Presence of aortocoronary bypass graft: Secondary | ICD-10-CM | POA: Diagnosis not present

## 2020-12-27 DIAGNOSIS — E785 Hyperlipidemia, unspecified: Secondary | ICD-10-CM | POA: Insufficient documentation

## 2020-12-27 DIAGNOSIS — I25118 Atherosclerotic heart disease of native coronary artery with other forms of angina pectoris: Secondary | ICD-10-CM

## 2020-12-27 DIAGNOSIS — I469 Cardiac arrest, cause unspecified: Secondary | ICD-10-CM | POA: Diagnosis not present

## 2020-12-27 LAB — ECHOCARDIOGRAM COMPLETE
AR max vel: 1.06 cm2
AV Area VTI: 1.04 cm2
AV Area mean vel: 0.95 cm2
AV Mean grad: 11 mmHg
AV Peak grad: 19.2 mmHg
Ao pk vel: 2.19 m/s
Area-P 1/2: 4.8 cm2
S' Lateral: 4.4 cm

## 2020-12-27 NOTE — Telephone Encounter (Signed)
Patient's wife was returning called stated his rash have gotten better. And she will call back after is appt this morning

## 2020-12-27 NOTE — Telephone Encounter (Addendum)
Called patient to review echocardiogram.   Echocardiogram performed today with LVEF reduced 25-30% with LV global hypokinesis, mildly elevated PASP, with moderate to severe AS.  Will route to Dr. Burt Knack for his review. He verbalized understanding of the result. As Brian Robinson is high risk for recurrent cardiac arrest per AHA guidelines Life Vest should remain in place. Updated prescription sent to [facility].  Brian Robinson has had to stop Entresto due to allergic reaction. Will need optimization of heart failure medications and discussion of next steps. Consider ICD and he will continue LifeVest until follow up appointment. Discussed with Almyra Free, RN who said okay to put in Dr. Claiborne Billings spot Wednesday. Scheduled 12/29/20 for 10:20AM with Dr. Claiborne Billings.   Brian Dubonnet, NP

## 2020-12-27 NOTE — Telephone Encounter (Signed)
Spoke to patient's wife.She stated husband's rash is better since stopping Entresto.Stated Dermatologist advised not to restart lower dose of Entresto.Advised I will send message to PharmD for advice.

## 2020-12-28 ENCOUNTER — Other Ambulatory Visit: Payer: Self-pay | Admitting: Cardiovascular Disease

## 2020-12-28 NOTE — Progress Notes (Signed)
Reviewed echo. 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 Dr Roxan Hockey and I had a lot of discussion about this patient before he went to the OR. 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 At the time of his hospitalization, his aortic valve looked bad morphologically, but his cath and echo gradients were mild-moderate (10-12 mmHg at cath and 24 mmHg by echo) with an LVEF of 55-60% at that time. I think he can probably be followed for now. Please schedule him for a valve clinic appt with me in 4-6 weeks and I will review all of this with him.   thx

## 2020-12-28 NOTE — Telephone Encounter (Signed)
If patient not willing to restart Entresto, needs to restart losartan

## 2020-12-29 ENCOUNTER — Encounter: Payer: Self-pay | Admitting: Cardiovascular Disease

## 2020-12-29 ENCOUNTER — Ambulatory Visit (INDEPENDENT_AMBULATORY_CARE_PROVIDER_SITE_OTHER): Payer: Medicare Other | Admitting: Cardiovascular Disease

## 2020-12-29 ENCOUNTER — Other Ambulatory Visit: Payer: Self-pay

## 2020-12-29 DIAGNOSIS — I5042 Chronic combined systolic (congestive) and diastolic (congestive) heart failure: Secondary | ICD-10-CM

## 2020-12-29 DIAGNOSIS — I350 Nonrheumatic aortic (valve) stenosis: Secondary | ICD-10-CM | POA: Diagnosis not present

## 2020-12-29 DIAGNOSIS — Z79899 Other long term (current) drug therapy: Secondary | ICD-10-CM

## 2020-12-29 DIAGNOSIS — R600 Localized edema: Secondary | ICD-10-CM

## 2020-12-29 DIAGNOSIS — Z951 Presence of aortocoronary bypass graft: Secondary | ICD-10-CM

## 2020-12-29 DIAGNOSIS — I469 Cardiac arrest, cause unspecified: Secondary | ICD-10-CM | POA: Diagnosis not present

## 2020-12-29 DIAGNOSIS — R21 Rash and other nonspecific skin eruption: Secondary | ICD-10-CM | POA: Diagnosis not present

## 2020-12-29 DIAGNOSIS — E785 Hyperlipidemia, unspecified: Secondary | ICD-10-CM

## 2020-12-29 DIAGNOSIS — I2510 Atherosclerotic heart disease of native coronary artery without angina pectoris: Secondary | ICD-10-CM | POA: Diagnosis not present

## 2020-12-29 MED ORDER — LOSARTAN POTASSIUM 25 MG PO TABS: 25.0000 mg | ORAL_TABLET | Freq: Every day | ORAL | 3 refills | Status: DC

## 2020-12-29 MED ORDER — METOPROLOL SUCCINATE ER 50 MG PO TB24: ORAL_TABLET | ORAL | 3 refills | Status: DC

## 2020-12-29 MED ORDER — ASPIRIN 81 MG PO TBEC: 81.0000 mg | DELAYED_RELEASE_TABLET | Freq: Every day | ORAL | Status: AC

## 2020-12-29 MED ORDER — SPIRONOLACTONE 25 MG PO TABS: 12.5000 mg | ORAL_TABLET | Freq: Every day | ORAL | 3 refills | Status: DC

## 2020-12-29 NOTE — Telephone Encounter (Signed)
Spoke to patient's wife.She stated Dermatologist advised not to take Entresto due to causing rash.Stated rash is starting to improve.Pharmacist advised to restart Losartan.Stated he will start Losartan 25 mg daily this morning.He will keep appointment with Dr.Kelly this morning at 10:20 am.

## 2020-12-29 NOTE — Progress Notes (Signed)
Cardiology Office Note    Date:  01/02/2021   ID:  Brian Robinson, DOB 25-Oct-1945, MRN [id number]  PCP:  Seward Carol, Robinson  Cardiologist:  Shelva Majestic, Robinson   Initial office evaluation with me following his hospitalization in June 2022  History of Present Illness:  Brian Robinson is a 75 y.o. male who denied any known prior coronary artery disease but remotely has been told of having a cardiac murmur and also had a history of hypertension and hyperlipidemia.  On September 15, 2020, he suffered an out of hospital witnessed cardiac arrest.  His wife called 911 and was coached through CPR.  He was successfully shocked once after arrival by the fire department with return of spontaneous circulation.  He presented to the [facility] emergency room obtunded with possible posturing.  Initial acute catheterization was deferred pending neurologic stability.  Upon arrival he was hypertensive and hypothermic.  No acute changes were noted on his initial head CT although there was some concern for chronic atrophic/ischemic changes.  Troponins increased to 1177.  There was no evidence for aortic dissection or aneurysm on CT imaging and he was found to have initial multiple rib fractures secondary to CPR.  An echo Doppler study on September 17, 2020 showed an EF of 55-60 with grade 2 diastolic dysfunction and mild hypokinesis of the distal septum.  In his early hospitalization he did have episodes of junctional rhythm.  His initial potassium was low and transaminases were elevated.  Following stability, he ultimately was referred for cardiac catheterization which was done by Dr. 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  There was mild to moderate disease in his left circumflex and OM 3 vessel.  Initial LVEDP was elevated at 30 mm  and he was found to have mild aortic stenosis with a peak to peak gradient of 10 to 15 mm.  He was seen by Dr. Roxan Hockey and on September 23, 2020 underwent successful coronary artery bypass graft surgery x3 with good quality targets and conduits with a LIMA to his LAD, SVG to PDA, and SVG to diagonal.  With his mild AS, this was not intervened upon.  He developed atrial fibrillation postoperatively for which she was treated with amiodarone  He was discharged with a LifeVest.  He was seen in the office by Laurann Montana, NP on October 12, 2020.  At that time he was feeling well and was walking regularly and was wearing his LifeVest.  His Lopressor was discontinued and he was started on Entresto 24/26 mg twice twice daily.  On November 02, 2020 a nurse was concerned that he possibly was having periods of atrial fibrillation.  An ECG from his apple watch showed inconclusive results.  He was last seen in the office on November 08, 2020 by Coletta Memos, NP and denied any irregular heartbeats or palpitations.  He was walking 3 times per week and did experience of increased dyspnea with increased physical activity particular with walking on an incline.  His ECG showed sinus arrhythmia with PVCs.  A cardiac monitor was ordered and laboratory was checked.  A cardiac monitor apparently showed sinus rhythm with no episodes of atrial fibrillation.  He did have some PVCs and PACs with transient bigeminal and trigeminal rhythms.  His Entresto dose was titrated to 49/51 mg twice a day  on December 09, 2020. Over the past 2 to 3 weeks he has noticed increasing shortness of breath without chest pain.  He also developed a significant maculopapular rash shortly after his dose titration of Entresto and he was seen by Dr. Renda Rolls of dermatology and it was strongly felt that his diffuse maculopapular rash was secondary to [facility] and he was advised to discontinue Entresto and received his last dose on December 24, 2020.  His  Entresto dose was held for the past 5 days, and he was restarted back on losartan and has received 1 dose.  He underwent a follow-up echo Doppler study in December 27, 2020.  This echo Doppler study now shows severe LV dysfunction with EF of 25 to 30% with global hypokinesis.  There is mild left atrial dilatation.  The aortic valve was calcified and there is significant limitation of leaflet mobility suggesting severe aortic stenosis.  This was significantly different from his prior to CABG evaluation.  On his most recent echo he was found to have moderate to severe aortic stenosis with a mean gradient of 11 and peak gradient of 19.2 and estimated aortic valve area of 1 cm.  He has an appointment to see Dr. Burt Knack fon January 28, 2021 for consideration of TAVR in the future.  He presents to my office today for evaluation.    Past Medical History:  Diagnosis Date   COVID-19    received Mab 01/2020   Diabetes mellitus without complication ([place])    Heart murmur    Hypertension    Mild aortic stenosis     Past Surgical History:  Procedure Laterality Date   CORONARY ARTERY BYPASS GRAFT N/A 09/23/2020   Procedure: CORONARY ARTERY BYPASS GRAFTING (CABG), ON PUMP, TIMES THREE, USING LEFT INTERNAL MAMMARY ARTERY AND LEFT ENDOSCOPICALLY HARVESTED GREATER SAPHENOUS VEIN;  Surgeon: [name], Robinson;  Location: [place];  Service: Open Heart Surgery;  Laterality: N/A;   ENDOVEIN HARVEST OF GREATER SAPHENOUS VEIN Left 09/23/2020   Procedure: ENDOVEIN HARVEST OF GREATER SAPHENOUS VEIN;  Surgeon: [name], Robinson;  Location: [place];  Service: Open Heart Surgery;  Laterality: Left;   LEFT HEART CATH AND CORONARY ANGIOGRAPHY N/A 09/20/2020   Procedure: LEFT HEART CATH AND CORONARY ANGIOGRAPHY;  Surgeon: Nelva Bush, Robinson;  Location: [place] [facility];  Service: Cardiovascular;  Laterality: N/A;   TEE WITHOUT CARDIOVERSION N/A 09/23/2020   Procedure: TRANSESOPHAGEAL ECHOCARDIOGRAM (TEE);  Surgeon:  [name], Robinson;  Location: [place];  Service: Open Heart Surgery;  Laterality: N/A;    Current Medications: Outpatient Medications Prior to Visit  Medication Sig Dispense Refill   acetaminophen (TYLENOL) 500 MG tablet Take 2 tablets (1,000 mg total) by mouth every 6 (six) hours as needed. 30 tablet 0   diphenhydrAMINE (BENADRYL) 25 mg capsule Take 25 mg by mouth every 6 (six) hours as needed for allergies or sleep.     ferrous sulfate 325 (65 FE) MG tablet Take by mouth daily with breakfast. 65 mg daily     metFORMIN (GLUCOPHAGE) 500 MG tablet Take 500 mg by mouth 2 (two) times daily with a meal.     Multiple Vitamins-Minerals (PRESERVISION AREDS 2) CAPS Take 1 capsule by mouth 2 (two) times daily.     sertraline (ZOLOFT) 50 MG tablet Take 50 mg by mouth in the morning.     aspirin EC 325 MG EC tablet Take 1 tablet (325 mg total) by mouth daily. 30 tablet 0   atenolol (TENORMIN)  50 MG tablet Take 1 tablet (50 mg total) by mouth daily. 30 tablet 2   furosemide (LASIX) 20 MG tablet TAKE 1 TABLET BY MOUTH PRN for weight gain 2 lbs overnight or 5 lbs in one week or edema 30 tablet 3   losartan (COZAAR) 25 MG tablet Take 1 tablet (25 mg total) by mouth daily. 90 tablet 3   potassium chloride SA (KLOR-CON) 20 MEQ tablet TAKE 1 TABLET BY MOUTH DAILY FOR 5 DAYS THEN HOLD 30 tablet 0   No facility-administered medications prior to visit.     Allergies:   Recent severe rash development after initiation of Entresto  Social History   Socioeconomic History   Marital status: Married    Spouse name: Not on file   Number of children: Not on file   Years of education: Not on file   Highest education level: Not on file  Occupational History   Not on file  Tobacco Use   Smoking status: Never   Smokeless tobacco: Never  Substance and Sexual Activity   Alcohol use: Not on file   Drug use: Not on file   Sexual activity: Not on file  Other Topics Concern   Not on file  Social History  Narrative   Not on file   Social Determinants of Health   Financial Resource Strain: Not on file  Food Insecurity: Not on file  Transportation Needs: Not on file  Physical Activity: Not on file  Stress: Not on file  Social Connections: Not on file     Family History:  The patient's family history includes Cancer in his father and mother.   ROS General: Negative; No fevers, chills, or night sweats;  HEENT: Negative; No changes in vision or hearing, sinus congestion, difficulty swallowing Pulmonary: Negative; No cough, wheezing, shortness of breath, hemoptysis Cardiovascular: See HPI GI: Negative; No nausea, vomiting, diarrhea, or abdominal pain GU: Negative; No dysuria, hematuria, or difficulty voiding Musculoskeletal: Negative; no myalgias, joint pain, or weakness Hematologic/Oncology: Negative; no easy bruising, bleeding Endocrine: Negative; no heat/cold intolerance; no diabetes Neuro: Negative; no changes in balance, headaches Skin: Severe maculopapular erythematous rash felt to be due to [facility], evaluated by Dr. Renda Rolls of dermatology.  Rash has significantly resolved with discontinuance of treatment Psychiatric: Negative; No behavioral problems, depression Sleep: Negative; No snoring, daytime sleepiness, hypersomnolence, bruxism, restless legs, hypnogognic hallucinations, no cataplexy Other comprehensive 14 point system review is negative.   PHYSICAL EXAM:   VS:  BP 138/88   Pulse 70   Ht _0  (1.753 m)   Wt 196 lb 12.8 oz (89.3 kg)   SpO2 92%   BMI 29.06 kg/m     Repeat blood pressure by me 138/88   Wt Readings from Last 3 Encounters:  12/29/20 196 lb 12.8 oz (89.3 kg)  11/08/20 193 lb (87.5 kg)  10/19/20 187 lb (84.8 kg)    General: Alert, oriented, no distress.  Wearing a LifeVest.  Skin: normal turgor, no rashes, warm and dry HEENT: Normocephalic, atraumatic. Pupils equal round and reactive to light; sclera anicteric; extraocular muscles intact;  Nose  without nasal septal hypertrophy Mouth/Parynx benign; Mallinpatti scale 3 Neck: No JVD, no carotid bruits; normal carotid upstroke Lungs: clear to ausculatation and percussion; no wheezing or rales Chest wall: without tenderness to palpitation Heart: PMI not displaced, RRR, s1 s2 normal, 6-2/3 systolic murmur in the aortic area no diastolic murmur, no rubs, gallops, thrills, or heaves Abdomen: soft, nontender; no hepatosplenomehaly, BS+; abdominal aorta nontender and  not dilated by palpation. Back: no CVA tenderness Pulses 2+ Musculoskeletal: full range of motion, normal strength, no joint deformities Extremities: Trace edema bilaterally above the sock line in lower extremity ; no clubbing cyanosis, Homan's sign negative  Neurologic: grossly nonfocal; Cranial nerves grossly wnl Psychologic: Normal mood and affect   Studies/Labs Reviewed:   ECG (independently read by me): NSR at 70; QS V1-4, ST T abnormality laterally  Recent Labs: BMP Latest Ref Rng & Units 12/07/2020 11/23/2020 11/08/2020  Glucose 65 - 99 mg/dL 87 108(H) 87  BUN 8 - 27 mg/dL _0 Creatinine 0.76 - 1.27 mg/dL 0.77 0.80 0.87  BUN/Creat Ratio 10 - _1 Sodium 134 - 144 mmol/L 141 139 141  Potassium 3.5 - 5.2 mmol/L 4.5 4.3 5.0  Chloride 96 - 106 mmol/L 100 99 102  CO2 20 - 29 mmol/L _2 Calcium 8.6 - 10.2 mg/dL 9.4 9.5 9.4     Hepatic Function Latest Ref Rng & Units 10/12/2020 09/28/2020 09/27/2020  Total Protein 6.0 - 8.5 g/dL 6.8 6.6 6.5  Albumin 3.7 - 4.7 g/dL 4.0 3.0(L) 2.9(L)  AST 0 - 40 IU/L 23 248(H) 327(H)  ALT 0 - 44 IU/L 15 128(H) 140(H)  Alk Phosphatase 44 - 121 IU/L 114 101 92  Total Bilirubin 0.0 - 1.2 mg/dL 1.0 4.5(H) 5.6(H)  Bilirubin, Direct 0.0 - 0.2 mg/dL - - 3.2(H)    CBC Latest Ref Rng & Units 11/08/2020 10/12/2020 09/28/2020  WBC 3.4 - 10.8 x10E3/uL 6.7 5.6 16.8(H)  Hemoglobin 13.0 - 17.7 g/dL 11.2(L) 10.5(L) 10.3(L)  Hematocrit 37.5 - 51.0 % 34.8(L) 32.5(L) 30.1(L)  Platelets  150 - 450 x10E3/uL 202 260 244   Lab Results  Component Value Date   MCV 87 11/08/2020   MCV 92 10/12/2020   MCV 88.5 09/28/2020   Lab Results  Component Value Date   TSH 2.110 11/08/2020   Lab Results  Component Value Date   HGBA1C 5.4 09/18/2020     BNP No results found for: BNP  ProBNP No results found for: PROBNP   Lipid Panel     Component Value Date/Time   CHOL 141 09/18/2020 0023   TRIG 96 09/18/2020 0023   HDL 40 (L) 09/18/2020 0023   CHOLHDL 3.5 09/18/2020 0023   VLDL 19 09/18/2020 0023   LDLCALC 82 09/18/2020 0023     RADIOLOGY: ECHOCARDIOGRAM COMPLETE  Result Date: 12/27/2020    ECHOCARDIOGRAM REPORT   Patient Name:   Brian Robinson Date of Exam: 12/27/2020 Medical Rec #:  [id number]      Height:       69.0 in Accession #:    [id number]     Weight:       193.0 lb Date of Birth:  1945/05/05       BSA:          2.035 m Patient Age:    49 years       BP:           147/94 mmHg Patient Gender: M              HR:           67 bpm. Exam Location:  [place] Procedure: 2D Echo, 3D Echo, Cardiac Doppler and Color Doppler Indications:    I46.9 Cardiac arrest  History:        Patient has prior history of Echocardiogram examinations, most  recent 09/17/2020. CAD, Prior CABG, Signs/Symptoms:Murmur and                 Edema; Risk Factors:Hypertension.  Sonographer:    Marygrace Drought RCS Referring Phys: [phone] [place]  1. Severe ischemic DCM post CABG global hypokinesis worse in septum and apex . Left ventricular ejection fraction, by estimation, is 25 to 30%. The left ventricle has severely decreased function. The left ventricle demonstrates global hypokinesis. Left ventricular diastolic parameters are indeterminate.  2. Right ventricular systolic function is normal. The right ventricular size is normal. There is mildly elevated pulmonary artery systolic pressure.  3. Left atrial size was mildly dilated.  4. The mitral valve is degenerative.  Trivial mitral valve regurgitation. No evidence of mitral stenosis. Moderate mitral annular calcification.  5. Morphologically patient has significant AS Limited leaflet motion severely calcified DVI 0.25 AVA 1 cm2 Suspect he would benefit from TAVR Dr Burt Knack has reviewed case before At cath prior to CABG peak to peak gradient only 10-15 mHg S Suggest structural heart team review again . The aortic valve is calcified. There is severe calcifcation of the aortic valve. There is severe thickening of the aortic valve. Aortic valve regurgitation is not visualized. Moderate to severe aortic valve stenosis.  6. The inferior vena cava is normal in size with greater than 50% respiratory variability, suggesting right atrial pressure of 3 mmHg. FINDINGS  Left Ventricle: Severe ischemic DCM post CABG global hypokinesis worse in septum and apex. Left ventricular ejection fraction, by estimation, is 25 to 30%. The left ventricle has severely decreased function. The left ventricle demonstrates global hypokinesis. The left ventricular internal cavity size was normal in size. There is no left ventricular hypertrophy. Left ventricular diastolic parameters are indeterminate. Right Ventricle: The right ventricular size is normal. No increase in right ventricular wall thickness. Right ventricular systolic function is normal. There is mildly elevated pulmonary artery systolic pressure. The tricuspid regurgitant velocity is 2.92  m/s, and with an assumed right atrial pressure of 3 mmHg, the estimated right ventricular systolic pressure is 03.2 mmHg. Left Atrium: Left atrial size was mildly dilated. Right Atrium: Right atrial size was normal in size. Pericardium: There is no evidence of pericardial effusion. Mitral Valve: The mitral valve is degenerative in appearance. There is moderate thickening of the mitral valve leaflet(s). There is moderate calcification of the mitral valve leaflet(s). Moderate mitral annular calcification. Trivial  mitral valve regurgitation. No evidence of mitral valve stenosis. Tricuspid Valve: The tricuspid valve is normal in structure. Tricuspid valve regurgitation is not demonstrated. No evidence of tricuspid stenosis. Aortic Valve: Morphologically patient has significant AS Limited leaflet motion severely calcified DVI 0.25 AVA 1 cm2 Suspect he would benefit from TAVR Dr Burt Knack has reviewed case before At cath prior to CABG peak to peak gradient only 10-15 mHg S Suggest structural heart team review again. The aortic valve is calcified. There is severe calcifcation of the aortic valve. There is severe thickening of the aortic valve. Aortic valve regurgitation is not visualized. Moderate to severe aortic stenosis is present. Aortic valve mean gradient measures 11.0 mmHg. Aortic valve peak gradient measures 19.2 mmHg. Aortic valve area, by VTI measures 1.04 cm. Pulmonic Valve: The pulmonic valve was normal in structure. Pulmonic valve regurgitation is not visualized. No evidence of pulmonic stenosis. Aorta: The aortic root is normal in size and structure. Venous: The inferior vena cava is normal in size with greater than 50% respiratory variability, suggesting right atrial pressure of 3  mmHg. IAS/Shunts: No atrial level shunt detected by color flow Doppler.  LEFT VENTRICLE PLAX 2D LVIDd:         5.30 cm  Diastology LVIDs:         4.40 cm  LV e' medial:    3.26 cm/s LV PW:         0.90 cm  LV E/e' medial:  25.9 LV IVS:        0.80 cm  LV e' lateral:   7.07 cm/s LVOT diam:     2.30 cm  LV E/e' lateral: 11.9 LV SV:         48 LV SV Index:   24 LVOT Area:     4.15 cm                          3D Volume EF:                         3D EF:        56 %                         LV EDV:       164 ml                         LV ESV:       72 ml                         LV SV:        93 ml RIGHT VENTRICLE RV Basal diam:  4.50 cm RV Mid diam:    3.60 cm RV S prime:     7.51 cm/s TAPSE (M-mode): 1.5 cm RVSP:           37.1 mmHg LEFT ATRIUM              Index       RIGHT ATRIUM           Index LA diam:        4.00 cm 1.97 cm/m  RA Pressure: 3.00 mmHg LA Vol (A2C):   79.9 ml 39.26 ml/m RA Area:     17.70 cm LA Vol (A4C):   63.6 ml 31.25 ml/m RA Volume:   49.90 ml  24.52 ml/m LA Biplane Vol: 72.0 ml 35.38 ml/m  AORTIC VALVE AV Area (Vmax):    1.06 cm AV Area (Vmean):   0.95 cm AV Area (VTI):     1.04 cm AV Vmax:           219.00 cm/s AV Vmean:          156.000 cm/s AV VTI:            0.464 m AV Peak Grad:      19.2 mmHg AV Mean Grad:      11.0 mmHg LVOT Vmax:         56.10 cm/s LVOT Vmean:        35.600 cm/s LVOT VTI:          0.116 m LVOT/AV VTI ratio: 0.25  AORTA Ao Root diam: 3.20 cm Ao Asc diam:  3.60 cm MITRAL VALVE               TRICUSPID VALVE MV Area (PHT):  TR Peak grad:   34.1 mmHg MV Decel Time:             TR Vmax:        292.00 cm/s MV E velocity: 84.40 cm/s  Estimated RAP:  3.00 mmHg MV A velocity: 28.30 cm/s  RVSP:           37.1 mmHg MV E/A ratio:  2.98                            SHUNTS                            Systemic VTI:  0.12 m                            Systemic Diam: 2.30 cm Brian Robinson Electronically signed by Brian Robinson Signature Date/Time: 12/27/2020/11:18:17 AM    Final      Additional studies/ records that were reviewed today include:   ECHO; 09/17/2020 IMPRESSIONS   1. Mild hypokinesis of the distal septum with overall preserved LV  function; calcified aortic valve with mild AS by doppler but visually  looks moderate.   2. Left ventricular ejection fraction, by estimation, is 55 to 60%. The  left ventricle has normal function. The left ventricle demonstrates  regional wall motion abnormalities (see scoring diagram/findings for  description). There is mild left ventricular   hypertrophy. Left ventricular diastolic parameters are consistent with  Grade II diastolic dysfunction (pseudonormalization). Elevated left atrial  pressure.   3. Right ventricular systolic function is normal.  The right ventricular  size is normal.   4. The mitral valve is normal in structure. Trivial mitral valve  regurgitation. No evidence of mitral stenosis. Moderate mitral annular  calcification.   5. The aortic valve is calcified. Aortic valve regurgitation is not  visualized. Mild aortic valve stenosis.   6. The inferior vena cava is normal in size with greater than 50%  respiratory variability, suggesting right atrial pressure of 3 mmHg.   FINDINGS   Left Ventricle: Left ventricular ejection fraction, by estimation, is 55  to 60%. The left ventricle has normal function. The left ventricle  demonstrates regional wall motion abnormalities. Definity contrast agent  was given IV to delineate the left  ventricular endocardial borders. The left ventricular internal cavity size  was normal in size. There is mild left ventricular hypertrophy. Left  ventricular diastolic parameters are consistent with Grade II diastolic  dysfunction (pseudonormalization).  Elevated left atrial pressure.   Right Ventricle: The right ventricular size is normal. Right ventricular  systolic function is normal.   Left Atrium: Left atrial size was normal in size.   Right Atrium: Right atrial size was normal in size.   Pericardium: There is no evidence of pericardial effusion.   Mitral Valve: The mitral valve is normal in structure. Moderate mitral  annular calcification. Trivial mitral valve regurgitation. No evidence of  mitral valve stenosis.   Tricuspid Valve: The tricuspid valve is normal in structure. Tricuspid  valve regurgitation is trivial. No evidence of tricuspid stenosis.   Aortic Valve: The aortic valve is calcified. Aortic valve regurgitation is  not visualized. Mild aortic stenosis is present. Aortic valve mean  gradient measures 14.0 mmHg. Aortic valve peak gradient measures 24.9  mmHg. Aortic valve area, by VTI measures  1.88 cm.   Pulmonic  Valve: The pulmonic valve was normal in  structure. Pulmonic valve  regurgitation is not visualized. No evidence of pulmonic stenosis.   Aorta: The aortic root is normal in size and structure.   Venous: The inferior vena cava is normal in size with greater than 50%  respiratory variability, suggesting right atrial pressure of 3 mmHg.      Additional Comments: Mild hypokinesis of the distal septum with overall  preserved LV function; calcified aortic valve with mild AS by doppler but  visually looks moderate.      LEFT VENTRICLE  PLAX 2D  LVIDd:         4.40 cm      Diastology  LVIDs:         3.20 cm      LV e' medial:    3.38 cm/s  LV PW:         1.30 cm      LV E/e' medial:  25.7  LV IVS:        1.30 cm      LV e' lateral:   4.82 cm/s  LVOT diam:     2.20 cm      LV E/e' lateral: 18.0  LV SV:         97  LV SV Index:   50  LVOT Area:     3.80 cm     LV Volumes (MOD)  LV vol d, MOD A2C: 54.1 ml  LV vol d, MOD A4C: 119.0 ml  LV vol s, MOD A2C: 26.2 ml  LV vol s, MOD A4C: 55.2 ml  LV SV MOD A2C:     27.9 ml  LV SV MOD A4C:     119.0 ml  LV SV MOD BP:      46.1 ml   RIGHT VENTRICLE             IVC  RV S prime:     10.50 cm/s  IVC diam: 2.00 cm  TAPSE (M-mode): 2.4 cm   LEFT ATRIUM             Index       RIGHT ATRIUM           Index  LA diam:        4.60 cm 2.35 cm/m  RA Area:     13.10 cm  LA Vol (A2C):   28.8 ml 14.72 ml/m RA Volume:   27.20 ml  13.90 ml/m  LA Vol (A4C):   57.6 ml 29.44 ml/m  LA Biplane Vol: 42.7 ml 21.82 ml/m   AORTIC VALVE  AV Area (Vmax):    2.20 cm  AV Area (Vmean):   1.98 cm  AV Area (VTI):     1.88 cm  AV Vmax:           249.33 cm/s  AV Vmean:          173.000 cm/s  AV VTI:            0.515 m  AV Peak Grad:      24.9 mmHg  AV Mean Grad:      14.0 mmHg  LVOT Vmax:         144.50 cm/s  LVOT Vmean:        90.200 cm/s  LVOT VTI:          0.255 m  LVOT/AV VTI ratio: 0.50     AORTA  Ao Root diam: 3.60 cm  Ao Asc diam:  3.70 cm   MITRAL VALVE  MV Area (PHT): 3.03 cm     SHUNTS  MV Decel Time: 250 msec    Systemic VTI:  0.26 m  MV E velocity: 87.00 cm/s  Systemic Diam: 2.20 cm  MV A velocity: 57.60 cm/s  MV E/A ratio:  1.51   CATH: 09/20/2020 Conclusions: Severe three-vessel coronary artery disease, including chronic total occlusion of proximal LAD with left-to-left collaterals, 75% ostial/proximal ramus intermedius stenosis, and sequential 75% ostial and 90% distal RCA lesions.  LCx and OM3 have mild to moderate disease of up to 40%. Moderately elevated left ventricular filling pressure (LVEDP ~30 mmHg). Mild aortic stenosis (peak-to-peak gradient 10-15 mmHg.   Recommendations: Imaged reviewed with Dr. Burt Knack during the procedure.  We will plan for cardiac surgery consultation for CABG given severe LAD and RCA disease with good distal targets in both vessels. Defer reinitiation of IV heparin, as the coronary artery disease appears chronic and hemoglobin has trended down significantly since admission. Aggressive secondary prevention. Diurese with furosemide 40 mg IV x 1.  Further diureses to be based on urine output, volume status, and renal function.       ECHO: 12/27/2020 IMPRESSIONS   1. Severe ischemic DCM post CABG global hypokinesis worse in septum and  apex . Left ventricular ejection fraction, by estimation, is 25 to 30%.  The left ventricle has severely decreased function. The left ventricle  demonstrates global hypokinesis. Left  ventricular diastolic parameters are indeterminate.   2. Right ventricular systolic function is normal. The right ventricular  size is normal. There is mildly elevated pulmonary artery systolic  pressure.   3. Left atrial size was mildly dilated.   4. The mitral valve is degenerative. Trivial mitral valve regurgitation.  No evidence of mitral stenosis. Moderate mitral annular calcification.   5. Morphologically patient has significant AS Limited leaflet motion  severely calcified DVI 0.25 AVA 1 cm2 Suspect he  would benefit from TAVR  Dr Burt Knack has reviewed case before At cath prior to CABG peak to peak  gradient only 10-15 mHg S Suggest  structural heart team review again . The aortic valve is calcified. There  is severe calcifcation of the aortic valve. There is severe thickening of  the aortic valve. Aortic valve regurgitation is not visualized. Moderate  to severe aortic valve stenosis.   6. The inferior vena cava is normal in size with greater than 50%  respiratory variability, suggesting right atrial pressure of 3 mmHg.   ASSESSMENT:    1. Cardiac arrest ([place]): 09/16/2020   2. CAD in native artery   3. S/P CABG (coronary artery bypass graft)   4. Aortic valve stenosis, etiology of cardiac valve disease unspecified   5. Chronic combined systolic and diastolic heart failure ([place])   6. Hyperlipidemia LDL goal <70   7. Bilateral leg edema   8. Maculopapular rash:  secondary to Entresto   9. Medication management     PLAN:  Mr. Jerin Franzel is a 75 year old gentleman who has a history of hypertension and hyperlipidemia and suffered an out-of-hospital witnessed cardiac arrest on September 16, 2020.  He underwent prompt initial CPR and following countershock had return of ROSC.  He had initial anoxic encephalopathy and an initial echo Doppler study showed normal LV function and raise concern for at least mild aortic stenosis although visually it was felt possibly to be in the moderate range.  Following significant begin recovery of neurologic function he underwent elective cardiac catheterization and was  was found to have severe multivessel CAD and underwent ultimate successful CABG revascularization surgery by Dr. Roxan Hockey with a LIMA to LAD, SVG to PDA, and SVG to diagonal.  He developed postoperative atrial fibrillation and required amiodarone therapy.  He was ultimately discharged with a LifeVest.  He was subsequently started on Entresto and unfortunately developed a severe maculopapular rash  after his dose was increased leading to discontinuance on December 24, 2020.  Losartan was reinitiated and he has since received 1 dose prior to today's evaluation with me.  His most recent echo Doppler study now shows severe LV dysfunction with an EF of 25 to 30%.  I personally reviewed the echocardiographic images which show severe Lee reduced aortic valve excursion and is now felt to have probable severe low gradient aortic stenosis.  Presently, he has been on a medical regimen of atenolol 50 mg daily.  Remotely he had been on furosemide but is no longer taking this.  With his reduced LV function I have recommended discontinuance of atenolol 50 mg and we will substitute this with metoprolol succinate and I advised he take 25 mg daily for the next 4 days and then increase to 50 mg daily in place of his atenolol.  He has just been restarted back on losartan for 1 day and I have suggested that he continue to take 25 mg daily for the next week and then if blood pressure is stable increase to 25 mg twice a day.  He has trace edema above his sock line bilaterally.  He had a prescription for furosemide to take as needed with supplemental potassium.  I recommended he discontinue furosemide and potassium.  I am adding low-dose spironolactone at 12.5 mg daily to his medical regimen for aldosterone blockade.  He showed me the pictures of his rash which appears to be a fixed drug eruption and his dermatologist felt strongly that this was secondary to [facility].  I have recommended that he continue to wear his LifeVest.  He may ultimately require a defibrillator if LV function remains poor.  He is status post CABG revascularization x3 and he denies any chest tightness or pressure following his revascularization surgery.  I have recommended he reduce his aspirin from 325 mg daily to 81 mg.  I spent over 50 minutes with he and his wife today face-to-face in the examining room.  I have recommended follow-up laboratory and I will  work him into my schedule to be seen on January 18, 2021 for follow-up evaluation prior to his scheduled office visit with Dr. Burt Knack to assess candidacy for possible TAVR.  He continues to be on metformin 500 mg daily for diabetes mellitus.  At his office visit, depending upon blood pressure and most likely will consider initiation of SGLT2 inhibition for concomitant therapy of his severe LV dysfunction.   Medication Adjustments/Labs and Tests Ordered: Current medicines are reviewed at length with the patient today.  Concerns regarding medicines are outlined above.  Medication changes, Labs and Tests ordered today are listed in the Patient Instructions below. Patient Instructions  Medication Instructions:  STOP atenolol  Tomorrow-START metoprolol succinate (Toprol XL) --take 1/2 tablet daily x 4 days, then increase to 1 tablet (50 mg) daily  Continue Losartan 25 mg daily x 1 week --if blood pressure remains >120, increase to 25 mg TWO times daily  START spironolactone 12.5 mg daily  STOP furosemide (Lasix) STOP potassium  DECREASE aspirin to 81 mg daily  *If you need a refill on your  cardiac medications before your next appointment, please call your pharmacy*   Lab Work: Please return for labs in 1-2 weeks (CMET, CBC, Lipid, TSH)  Our in office lab hours are Monday-Friday 8:00-4:00, closed for lunch 12:45-1:45 pm.  No appointment needed.  Follow-Up: At [facility], you and your health needs are our priority.  As part of our continuing mission to provide you with exceptional heart care, we have created designated Provider Care Teams.  These Care Teams include your primary Cardiologist (physician) and Advanced Practice Providers (APPs -  Physician Assistants and Nurse Practitioners) who all work together to provide you with the care you need, when you need it.  We recommend signing up for the patient portal called "MyChart".  Sign up information is provided on this After Visit  Summary.  MyChart is used to connect with patients for Virtual Visits (Te . lemedicine).  Patients are able to view lab/test results, encounter notes, upcoming appointments, etc.  Non-urgent messages can be sent to your provider as well.   To learn more about what you can do with MyChart, go to [email or web address].    Your next appointment:   October 25th at 9:30 AM with Dr. Claiborne Billings     Signed, Shelva Majestic, [place]  01/02/2021 12:29 PM    [facility] [address] [place], [place], [place]  [address] Phone: [phone]

## 2020-12-29 NOTE — Patient Instructions (Addendum)
Medication Instructions:  STOP atenolol  Tomorrow-START metoprolol succinate (Toprol XL) --take 1/2 tablet daily x 4 days, then increase to 1 tablet (50 mg) daily  Continue Losartan 25 mg daily x 1 week --if blood pressure remains >120, increase to 25 mg TWO times daily  START spironolactone 12.5 mg daily  STOP furosemide (Lasix) STOP potassium  DECREASE aspirin to 81 mg daily  *If you need a refill on your cardiac medications before your next appointment, please call your pharmacy*   Lab Work: Please return for labs in 1-2 weeks (CMET, CBC, Lipid, TSH)  Our in office lab hours are Monday-Friday 8:00-4:00, closed for lunch 12:45-1:45 pm.  No appointment needed.  Follow-Up: At [facility], you and your health needs are our priority.  As part of our continuing mission to provide you with exceptional heart care, we have created designated Provider Care Teams.  These Care Teams include your primary Cardiologist (physician) and Advanced Practice Providers (APPs -  Physician Assistants and Nurse Practitioners) who all work together to provide you with the care you need, when you need it.  We recommend signing up for the patient portal called "MyChart".  Sign up information is provided on this After Visit Summary.  MyChart is used to connect with patients for Virtual Visits (Te . lemedicine).  Patients are able to view lab/test results, encounter notes, upcoming appointments, etc.  Non-urgent messages can be sent to your provider as well.   To learn more about what you can do with MyChart, go to [email or web address].    Your next appointment:   October 25th at 9:30 AM with Dr. Claiborne Billings

## 2020-12-30 NOTE — Telephone Encounter (Signed)
Seen at appt TK

## 2020-12-31 DIAGNOSIS — Z4802 Encounter for removal of sutures: Secondary | ICD-10-CM | POA: Diagnosis not present

## 2021-01-02 ENCOUNTER — Encounter: Payer: Self-pay | Admitting: Cardiovascular Disease

## 2021-01-10 DIAGNOSIS — I5042 Chronic combined systolic (congestive) and diastolic (congestive) heart failure: Secondary | ICD-10-CM | POA: Diagnosis not present

## 2021-01-10 DIAGNOSIS — I2510 Atherosclerotic heart disease of native coronary artery without angina pectoris: Secondary | ICD-10-CM | POA: Diagnosis not present

## 2021-01-10 DIAGNOSIS — E785 Hyperlipidemia, unspecified: Secondary | ICD-10-CM | POA: Diagnosis not present

## 2021-01-10 DIAGNOSIS — Z951 Presence of aortocoronary bypass graft: Secondary | ICD-10-CM | POA: Diagnosis not present

## 2021-01-10 DIAGNOSIS — I350 Nonrheumatic aortic (valve) stenosis: Secondary | ICD-10-CM | POA: Diagnosis not present

## 2021-01-10 LAB — LIPID PANEL
Chol/HDL Ratio: 4 ratio (ref 0.0–5.0)
Cholesterol, Total: 178 mg/dL (ref 100–199)
HDL: 44 mg/dL (ref >39)
LDL Chol Calc (NIH): 121 mg/dL — ABNORMAL HIGH (ref 0–99)
Triglycerides: 66 mg/dL (ref 0–149)
VLDL Cholesterol Cal: 13 mg/dL (ref 5–40)

## 2021-01-10 LAB — COMPREHENSIVE METABOLIC PANEL
ALT: 14 IU/L (ref 0–44)
AST: 26 IU/L (ref 0–40)
Albumin/Globulin Ratio: 1.8 (ref 1.2–2.2)
Albumin: 4.6 g/dL (ref 3.7–4.7)
Alkaline Phosphatase: 92 IU/L (ref 44–121)
BUN/Creatinine Ratio: 16 (ref 10–24)
BUN: 15 mg/dL (ref 8–27)
Bilirubin Total: 0.6 mg/dL (ref 0.0–1.2)
CO2: 26 mmol/L (ref 20–29)
Calcium: 9.4 mg/dL (ref 8.6–10.2)
Chloride: 99 mmol/L (ref 96–106)
Creatinine, Ser: 0.93 mg/dL (ref 0.76–1.27)
Globulin, Total: 2.6 g/dL (ref 1.5–4.5)
Glucose: 87 mg/dL (ref 70–99)
Potassium: 5.2 mmol/L (ref 3.5–5.2)
Sodium: 138 mmol/L (ref 134–144)
Total Protein: 7.2 g/dL (ref 6.0–8.5)
eGFR: 85 mL/min/{1.73_m2} (ref >59)

## 2021-01-10 LAB — CBC
Hematocrit: 36.8 % — ABNORMAL LOW (ref 37.5–51.0)
Hemoglobin: 12.3 g/dL — ABNORMAL LOW (ref 13.0–17.7)
MCH: 29.4 pg (ref 26.6–33.0)
MCHC: 33.4 g/dL (ref 31.5–35.7)
MCV: 88 fL (ref 79–97)
Platelets: 202 10*3/uL (ref 150–450)
RBC: 4.18 x10E6/uL (ref 4.14–5.80)
RDW: 13.2 % (ref 11.6–15.4)
WBC: 6.7 10*3/uL (ref 3.4–10.8)

## 2021-01-10 LAB — TSH: TSH: 2.74 u[IU]/mL (ref 0.450–4.500)

## 2021-01-11 NOTE — Addendum Note (Signed)
Addended by: Hinton Dyer on: 01/11/2021 11:45 AM   Modules accepted: Orders

## 2021-01-12 NOTE — Progress Notes (Signed)
  Sherren Mocha, MD at 12/28/2020 10:13 AM  Status: Signed  Reviewed echo. 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 Dr Roxan Hockey and I had a lot of discussion about this patient before he went to the OR. 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 At the time of his hospitalization, his aortic valve looked bad morphologically, but his cath and echo gradients were mild-moderate (10-12 mmHg at cath and 24 mmHg by echo) with an LVEF of 55-60% at that time. I think he can probably be followed for now. Please schedule him for a valve clinic appt with me in 4-6 weeks and I will review all of this with him.    thx

## 2021-01-17 DIAGNOSIS — L309 Dermatitis, unspecified: Secondary | ICD-10-CM | POA: Diagnosis not present

## 2021-01-17 DIAGNOSIS — Z23 Encounter for immunization: Secondary | ICD-10-CM | POA: Diagnosis not present

## 2021-01-18 ENCOUNTER — Other Ambulatory Visit: Payer: Self-pay

## 2021-01-18 ENCOUNTER — Encounter: Payer: Self-pay | Admitting: Cardiovascular Disease

## 2021-01-18 ENCOUNTER — Ambulatory Visit (INDEPENDENT_AMBULATORY_CARE_PROVIDER_SITE_OTHER): Payer: Medicare Other | Admitting: Cardiovascular Disease

## 2021-01-18 DIAGNOSIS — I350 Nonrheumatic aortic (valve) stenosis: Secondary | ICD-10-CM | POA: Diagnosis not present

## 2021-01-18 DIAGNOSIS — I469 Cardiac arrest, cause unspecified: Secondary | ICD-10-CM | POA: Diagnosis not present

## 2021-01-18 DIAGNOSIS — I10 Essential (primary) hypertension: Secondary | ICD-10-CM | POA: Diagnosis not present

## 2021-01-18 DIAGNOSIS — Z951 Presence of aortocoronary bypass graft: Secondary | ICD-10-CM | POA: Diagnosis not present

## 2021-01-18 DIAGNOSIS — E118 Type 2 diabetes mellitus with unspecified complications: Secondary | ICD-10-CM | POA: Diagnosis not present

## 2021-01-18 DIAGNOSIS — I5042 Chronic combined systolic (congestive) and diastolic (congestive) heart failure: Secondary | ICD-10-CM | POA: Diagnosis not present

## 2021-01-18 DIAGNOSIS — I2510 Atherosclerotic heart disease of native coronary artery without angina pectoris: Secondary | ICD-10-CM

## 2021-01-18 DIAGNOSIS — Z79899 Other long term (current) drug therapy: Secondary | ICD-10-CM

## 2021-01-18 DIAGNOSIS — E785 Hyperlipidemia, unspecified: Secondary | ICD-10-CM

## 2021-01-18 MED ORDER — LOSARTAN POTASSIUM 25 MG PO TABS: ORAL_TABLET | ORAL | 3 refills | Status: DC

## 2021-01-18 MED ORDER — METOPROLOL SUCCINATE ER 50 MG PO TB24: ORAL_TABLET | ORAL | 3 refills | Status: DC

## 2021-01-18 NOTE — Progress Notes (Signed)
Cardiology Office Note    Date:  01/24/2021   ID:  Brian Robinson, DOB [birth year], MRN [id number]  PCP:  Seward Carol, MD  Cardiologist:  Shelva Majestic, MD   2 week F/U  office evaluation with me following his hospitalization in June 2022 and initial 12/29/2020 office evaluation.  History of Present Illness:  Brian Robinson is a 75 y.o. male who denied any known prior coronary artery disease but remotely has been told of having a cardiac murmur and also had a history of hypertension and hyperlipidemia.  On September 15, 2020, he suffered an out of hospital witnessed cardiac arrest.  His wife called 911 and was coached through CPR.  He was successfully shocked once after arrival by the fire department with return of spontaneous circulation.  He presented to the [facility] emergency room obtunded with possible posturing.  Initial acute catheterization was deferred pending neurologic stability.  Upon arrival he was hypertensive and hypothermic.  No acute changes were noted on his initial head CT although there was some concern for chronic atrophic/ischemic changes.  Troponins increased to 1177.  There was no evidence for aortic dissection or aneurysm on CT imaging and he was found to have initial multiple rib fractures secondary to CPR.  An echo Doppler study on September 17, 2020 showed an EF of 55-60 with grade 2 diastolic dysfunction and mild hypokinesis of the distal septum.  In his early hospitalization he did have episodes of junctional rhythm.  His initial potassium was low and transaminases were elevated.  Following stability, he ultimately was referred for cardiac catheterization which was done by Dr. 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  There was mild to moderate disease in his left circumflex and OM 3  vessel.  Initial LVEDP was elevated at 30 mm and he was found to have mild aortic stenosis with a peak to peak gradient of 10 to 15 mm.  He was seen by Dr. Roxan Hockey and on September 23, 2020 underwent successful coronary artery bypass graft surgery x3 with good quality targets and conduits with a LIMA to his LAD, SVG to PDA, and SVG to diagonal.  With his mild AS, this was not intervened upon.  He developed atrial fibrillation postoperatively for which she was treated with amiodarone  He was discharged with a LifeVest.  He was seen in the office by Laurann Montana, NP on October 12, 2020.  At that time he was feeling well and was walking regularly and was wearing his LifeVest.  His Lopressor was discontinued and he was started on Entresto 24/26 mg twice twice daily.  On November 02, 2020 a nurse was concerned that he possibly was having periods of atrial fibrillation.  An ECG from his apple watch showed inconclusive results.  He was last seen in the office on November 08, 2020 by Coletta Memos, NP and denied any irregular heartbeats or palpitations.  He was walking 3 times per week and did experience of increased dyspnea with increased physical activity particular with walking on an incline.  His ECG showed sinus arrhythmia with PVCs.  A cardiac monitor was ordered and laboratory was checked.  A cardiac monitor apparently showed sinus rhythm with no episodes of atrial fibrillation.  He did have some PVCs and PACs with transient bigeminal and trigeminal rhythms.  His Entresto dose  was titrated to 49/51 mg twice a day on December 09, 2020. Over the past 2 to 3 weeks he has noticed increasing shortness of breath without chest pain.  He also developed a significant maculopapular rash shortly after his dose titration of Entresto and he was seen by Dr. Renda Rolls of dermatology and it was strongly felt that his diffuse maculopapular rash was secondary to [facility] and he was advised to discontinue Entresto and received his  last dose on December 24, 2020.  His Entresto dose was held for the past 5 days, and he was restarted back on losartan and has received 1 dose.  He underwent a follow-up echo Doppler study in December 27, 2020.  This echo Doppler study now shows severe LV dysfunction with EF of 25 to 30% with global hypokinesis.  There is mild left atrial dilatation.  The aortic valve was calcified and there is significant limitation of leaflet mobility suggesting severe aortic stenosis.  This was significantly different from his prior to CABG evaluation.  On his most recent echo he was found to have moderate to severe aortic stenosis with a mean gradient of 11 and peak gradient of 19.2 and estimated aortic valve area of 1 cm.  He has an appointment to see Dr. Burt Knack fon January 28, 2021 for consideration of TAVR in the future.  I personally reviewed the echo Doppler study which showed severely reduced LV function with EF of 25 to 30% with severe reduction in aortic valve excursion suggesting probable severe low gradient aortic stenosis.  At the time he was on a medical regimen of atenolol 50 mg daily.  He was no longer taking furosemide.  With his reduced LV function I recommended he discontinue atenolol and substituted this with metoprolol succinate with initiation at 25 mg for the next 4 days and then to increase to 50 mg daily.  He had only received 1 days dose of losartan and I recommended he continue to take 25 mg daily for the next week and then if blood pressure is stable increase to 25 mg twice a day.  I commended he discontinue furosemide and potassium and added low-dose spironolactone 12.5 mg daily for aldosterone blockade.  I recommended he continue to wear his LifeVest that ultimately he may require a defibrillator if LV function remains poor.  I recommended that he be added onto my schedule in several weeks for follow-up evaluation prior to his evaluation with Dr. Burt Knack on January 28, 2021.  Since I last saw him,  he has felt improved.  He states his blood pressure at home has been approximately 1 17-5 70 systolically.  He has been without angina.  He denies significant shortness of breath.  Will need a LifeVest wearing extension.  He presents for reevaluation.   Past Medical History:  Diagnosis Date   COVID-19    received Mab 01/2020   Diabetes mellitus without complication ([place])    Heart murmur    Hypertension    Mild aortic stenosis     Past Surgical History:  Procedure Laterality Date   CORONARY ARTERY BYPASS GRAFT N/A 09/23/2020   Procedure: CORONARY ARTERY BYPASS GRAFTING (CABG), ON PUMP, TIMES THREE, USING LEFT INTERNAL MAMMARY ARTERY AND LEFT ENDOSCOPICALLY HARVESTED GREATER SAPHENOUS VEIN;  Surgeon: [name], MD;  Location: [place];  Service: Open Heart Surgery;  Laterality: N/A;   ENDOVEIN HARVEST OF GREATER SAPHENOUS VEIN Left 09/23/2020   Procedure: ENDOVEIN HARVEST OF GREATER SAPHENOUS VEIN;  Surgeon: [name], MD;  Location: [facility];  Service: Open Heart Surgery;  Laterality: Left;   LEFT HEART CATH AND CORONARY ANGIOGRAPHY N/A 09/20/2020   Procedure: LEFT HEART CATH AND CORONARY ANGIOGRAPHY;  Surgeon: Nelva Bush, MD;  Location: [place] [facility];  Service: Cardiovascular;  Laterality: N/A;   TEE WITHOUT CARDIOVERSION N/A 09/23/2020   Procedure: TRANSESOPHAGEAL ECHOCARDIOGRAM (TEE);  Surgeon: [name], MD;  Location: [place];  Service: Open Heart Surgery;  Laterality: N/A;    Current Medications: Outpatient Medications Prior to Visit  Medication Sig Dispense Refill   acetaminophen (TYLENOL) 500 MG tablet Take 2 tablets (1,000 mg total) by mouth every 6 (six) hours as needed. 30 tablet 0   ALPRAZolam (XANAX) 0.25 MG tablet Take 0.25 mg by mouth daily as needed.     aspirin 81 MG EC tablet Take 1 tablet (81 mg total) by mouth daily.     betamethasone dipropionate 0.05 % cream Apply 0.05 mg topically 2 (two) times daily.     diphenhydrAMINE  (BENADRYL) 25 mg capsule Take 25 mg by mouth every 6 (six) hours as needed for allergies or sleep.     ferrous sulfate 325 (65 FE) MG tablet Take by mouth daily with breakfast. 65 mg daily     metFORMIN (GLUCOPHAGE) 500 MG tablet Take 500 mg by mouth 2 (two) times daily with a meal.     Multiple Vitamins-Minerals (PRESERVISION AREDS 2) CAPS Take 1 capsule by mouth 2 (two) times daily.     sertraline (ZOLOFT) 50 MG tablet Take 50 mg by mouth in the morning.     spironolactone (ALDACTONE) 25 MG tablet Take 0.5 tablets (12.5 mg total) by mouth daily. 45 tablet 3   losartan (COZAAR) 25 MG tablet Take 1 tablet (25 mg total) by mouth daily. X 1 week, if Blood pressure remains >120, increase to 25 mg two times daily 90 tablet 3   metoprolol succinate (TOPROL XL) 50 MG 24 hr tablet Take 25 mg (1/2 tablet) for 4 days, then increase to 1 tablet (50 mg) daily 90 tablet 3   No facility-administered medications prior to visit.     Allergies:   Recent severe rash development after initiation of Entresto  Social History   Socioeconomic History   Marital status: Married    Spouse name: Not on file   Number of children: Not on file   Years of education: Not on file   Highest education level: Not on file  Occupational History   Not on file  Tobacco Use   Smoking status: Never   Smokeless tobacco: Never  Substance and Sexual Activity   Alcohol use: Not on file   Drug use: Not on file   Sexual activity: Not on file  Other Topics Concern   Not on file  Social History Narrative   Not on file   Social Determinants of Health   Financial Resource Strain: Not on file  Food Insecurity: Not on file  Transportation Needs: Not on file  Physical Activity: Not on file  Stress: Not on file  Social Connections: Not on file     Family History:  The patient's family history includes Cancer in his father and mother.   ROS General: Negative; No fevers, chills, or night sweats;  HEENT: Negative; No  changes in vision or hearing, sinus congestion, difficulty swallowing Pulmonary: Negative; No cough, wheezing, shortness of breath, hemoptysis Cardiovascular: See HPI GI: Negative; No nausea, vomiting, diarrhea, or abdominal pain GU: Negative; No dysuria, hematuria, or difficulty  voiding Musculoskeletal: Negative; no myalgias, joint pain, or weakness Hematologic/Oncology: Negative; no easy bruising, bleeding Endocrine: Negative; no heat/cold intolerance; no diabetes Neuro: Negative; no changes in balance, headaches Skin: Severe maculopapular erythematous rash felt to be due to [facility], evaluated by Dr. Renda Rolls of dermatology.  Rash has significantly resolved with discontinuance of treatment Psychiatric: Negative; No behavioral problems, depression Sleep: Negative; No snoring, daytime sleepiness, hypersomnolence, bruxism, restless legs, hypnogognic hallucinations, no cataplexy Other comprehensive 14 point system review is negative.   PHYSICAL EXAM:   VS:  BP (!) 150/90 (BP Location: Left Arm)   Pulse 72   Ht 5' 6" (1.676 m)   Wt 196 lb (88.9 kg)   SpO2 99%   BMI 31.64 kg/m     Repeat blood pressure by me was 168/88  Wt Readings from Last 3 Encounters:  01/18/21 196 lb (88.9 kg)  12/29/20 196 lb 12.8 oz (89.3 kg)  11/08/20 193 lb (87.5 kg)    General: Alert, oriented, no distress.  Skin: Mild rash over his torso in the region where he is wearing his LifeVest. HEENT: Normocephalic, atraumatic. Pupils equal round and reactive to light; sclera anicteric; extraocular muscles intact;  Nose without nasal septal hypertrophy Mouth/Parynx benign; Mallinpatti scale 3 Neck: No JVD, no carotid bruits; normal carotid upstroke Lungs: clear to ausculatation and percussion; no wheezing or rales Chest wall: without tenderness to palpitation Heart: PMI not displaced, RRR, s1 s2 normal, 2/6 systolic murmur, no diastolic murmur, no rubs, gallops, thrills, or heaves Abdomen: soft, nontender; no  hepatosplenomehaly, BS+; abdominal aorta nontender and not dilated by palpation. Back: no CVA tenderness Pulses 2+ Musculoskeletal: full range of motion, normal strength, no joint deformities Extremities: Trace edema extremity bilaterally above the sock line; no clubbing cyanosis or edema, Homan's sign negative  Neurologic: grossly nonfocal; Cranial nerves grossly wnl Psychologic: Normal mood and affect the first issue is coagulation   Studies/Labs Reviewed:   ECG (independently read by me): NSR at 70; QS V1-4, ST T abnormality laterally  Recent Labs: BMP Latest Ref Rng & Units 01/10/2021 12/07/2020 11/23/2020  Glucose 70 - 99 mg/dL 87 87 108(H)  BUN 8 - 27 mg/dL _0 Creatinine 0.76 - 1.27 mg/dL 0.93 0.77 0.80  BUN/Creat Ratio 10 - _1 Sodium 134 - 144 mmol/L 138 141 139  Potassium 3.5 - 5.2 mmol/L 5.2 4.5 4.3  Chloride 96 - 106 mmol/L 99 100 99  CO2 20 - 29 mmol/L _2 Calcium 8.6 - 10.2 mg/dL 9.4 9.4 9.5     Hepatic Function Latest Ref Rng & Units 01/10/2021 10/12/2020 09/28/2020  Total Protein 6.0 - 8.5 g/dL 7.2 6.8 6.6  Albumin 3.7 - 4.7 g/dL 4.6 4.0 3.0(L)  AST 0 - 40 IU/L 26 23 248(H)  ALT 0 - 44 IU/L 14 15 128(H)  Alk Phosphatase 44 - 121 IU/L 92 114 101  Total Bilirubin 0.0 - 1.2 mg/dL 0.6 1.0 4.5(H)  Bilirubin, Direct 0.0 - 0.2 mg/dL - - -    CBC Latest Ref Rng & Units 01/10/2021 11/08/2020 10/12/2020  WBC 3.4 - 10.8 x10E3/uL 6.7 6.7 5.6  Hemoglobin 13.0 - 17.7 g/dL 12.3(L) 11.2(L) 10.5(L)  Hematocrit 37.5 - 51.0 % 36.8(L) 34.8(L) 32.5(L)  Platelets 150 - 450 x10E3/uL 202 202 260   Lab Results  Component Value Date   MCV 88 01/10/2021   MCV 87 11/08/2020   MCV 92 10/12/2020   Lab Results  Component Value Date   TSH  2.740 01/10/2021   Lab Results  Component Value Date   HGBA1C 5.4 09/18/2020     BNP No results found for: BNP  ProBNP No results found for: PROBNP   Lipid Panel     Component Value Date/Time   CHOL 178 01/10/2021  1013   TRIG 66 01/10/2021 1013   HDL 44 01/10/2021 1013   CHOLHDL 4.0 01/10/2021 1013   CHOLHDL 3.5 09/18/2020 0023   VLDL 19 09/18/2020 0023   LDLCALC 121 (H) 01/10/2021 1013   LABVLDL 13 01/10/2021 1013     RADIOLOGY: ECHOCARDIOGRAM COMPLETE  Result Date: 12/27/2020    ECHOCARDIOGRAM REPORT   Patient Name:   Brian Robinson Date of Exam: 12/27/2020 Medical Rec #:  [id number]      Height:       69.0 in Accession #:    [id number]     Weight:       193.0 lb Date of Birth:  [birth year]       BSA:          2.035 m Patient Age:    69 years       BP:           147/94 mmHg Patient Gender: M              HR:           67 bpm. Exam Location:  [place] Procedure: 2D Echo, 3D Echo, Cardiac Doppler and Color Doppler Indications:    I46.9 Cardiac arrest  History:        Patient has prior history of Echocardiogram examinations, most                 recent 09/17/2020. CAD, Prior CABG, Signs/Symptoms:Murmur and                 Edema; Risk Factors:Hypertension.  Sonographer:    Marygrace Drought RCS Referring Phys: [phone] [place]  1. Severe ischemic DCM post CABG global hypokinesis worse in septum and apex . Left ventricular ejection fraction, by estimation, is 25 to 30%. The left ventricle has severely decreased function. The left ventricle demonstrates global hypokinesis. Left ventricular diastolic parameters are indeterminate.  2. Right ventricular systolic function is normal. The right ventricular size is normal. There is mildly elevated pulmonary artery systolic pressure.  3. Left atrial size was mildly dilated.  4. The mitral valve is degenerative. Trivial mitral valve regurgitation. No evidence of mitral stenosis. Moderate mitral annular calcification.  5. Morphologically patient has significant AS Limited leaflet motion severely calcified DVI 0.25 AVA 1 cm2 Suspect he would benefit from TAVR Dr Burt Knack has reviewed case before At cath prior to CABG peak to peak gradient only 10-15 mHg S Suggest  structural heart team review again . The aortic valve is calcified. There is severe calcifcation of the aortic valve. There is severe thickening of the aortic valve. Aortic valve regurgitation is not visualized. Moderate to severe aortic valve stenosis.  6. The inferior vena cava is normal in size with greater than 50% respiratory variability, suggesting right atrial pressure of 3 mmHg. FINDINGS  Left Ventricle: Severe ischemic DCM post CABG global hypokinesis worse in septum and apex. Left ventricular ejection fraction, by estimation, is 25 to 30%. The left ventricle has severely decreased function. The left ventricle demonstrates global hypokinesis. The left ventricular internal cavity size was normal in size. There is no left ventricular hypertrophy. Left ventricular diastolic parameters are indeterminate. Right Ventricle: The right ventricular size  is normal. No increase in right ventricular wall thickness. Right ventricular systolic function is normal. There is mildly elevated pulmonary artery systolic pressure. The tricuspid regurgitant velocity is 2.92  m/s, and with an assumed right atrial pressure of 3 mmHg, the estimated right ventricular systolic pressure is 63.3 mmHg. Left Atrium: Left atrial size was mildly dilated. Right Atrium: Right atrial size was normal in size. Pericardium: There is no evidence of pericardial effusion. Mitral Valve: The mitral valve is degenerative in appearance. There is moderate thickening of the mitral valve leaflet(s). There is moderate calcification of the mitral valve leaflet(s). Moderate mitral annular calcification. Trivial mitral valve regurgitation. No evidence of mitral valve stenosis. Tricuspid Valve: The tricuspid valve is normal in structure. Tricuspid valve regurgitation is not demonstrated. No evidence of tricuspid stenosis. Aortic Valve: Morphologically patient has significant AS Limited leaflet motion severely calcified DVI 0.25 AVA 1 cm2 Suspect he would benefit  from TAVR Dr Burt Knack has reviewed case before At cath prior to CABG peak to peak gradient only 10-15 mHg S Suggest structural heart team review again. The aortic valve is calcified. There is severe calcifcation of the aortic valve. There is severe thickening of the aortic valve. Aortic valve regurgitation is not visualized. Moderate to severe aortic stenosis is present. Aortic valve mean gradient measures 11.0 mmHg. Aortic valve peak gradient measures 19.2 mmHg. Aortic valve area, by VTI measures 1.04 cm. Pulmonic Valve: The pulmonic valve was normal in structure. Pulmonic valve regurgitation is not visualized. No evidence of pulmonic stenosis. Aorta: The aortic root is normal in size and structure. Venous: The inferior vena cava is normal in size with greater than 50% respiratory variability, suggesting right atrial pressure of 3 mmHg. IAS/Shunts: No atrial level shunt detected by color flow Doppler.  LEFT VENTRICLE PLAX 2D LVIDd:         5.30 cm  Diastology LVIDs:         4.40 cm  LV e' medial:    3.26 cm/s LV PW:         0.90 cm  LV E/e' medial:  25.9 LV IVS:        0.80 cm  LV e' lateral:   7.07 cm/s LVOT diam:     2.30 cm  LV E/e' lateral: 11.9 LV SV:         48 LV SV Index:   24 LVOT Area:     4.15 cm                          3D Volume EF:                         3D EF:        56 %                         LV EDV:       164 ml                         LV ESV:       72 ml                         LV SV:        93 ml RIGHT VENTRICLE RV Basal diam:  4.50 cm RV Mid diam:    3.60 cm RV S  prime:     7.51 cm/s TAPSE (M-mode): 1.5 cm RVSP:           37.1 mmHg LEFT ATRIUM             Index       RIGHT ATRIUM           Index LA diam:        4.00 cm 1.97 cm/m  RA Pressure: 3.00 mmHg LA Vol (A2C):   79.9 ml 39.26 ml/m RA Area:     17.70 cm LA Vol (A4C):   63.6 ml 31.25 ml/m RA Volume:   49.90 ml  24.52 ml/m LA Biplane Vol: 72.0 ml 35.38 ml/m  AORTIC VALVE AV Area (Vmax):    1.06 cm AV Area (Vmean):   0.95 cm AV Area  (VTI):     1.04 cm AV Vmax:           219.00 cm/s AV Vmean:          156.000 cm/s AV VTI:            0.464 m AV Peak Grad:      19.2 mmHg AV Mean Grad:      11.0 mmHg LVOT Vmax:         56.10 cm/s LVOT Vmean:        35.600 cm/s LVOT VTI:          0.116 m LVOT/AV VTI ratio: 0.25  AORTA Ao Root diam: 3.20 cm Ao Asc diam:  3.60 cm MITRAL VALVE               TRICUSPID VALVE MV Area (PHT):             TR Peak grad:   34.1 mmHg MV Decel Time:             TR Vmax:        292.00 cm/s MV E velocity: 84.40 cm/s  Estimated RAP:  3.00 mmHg MV A velocity: 28.30 cm/s  RVSP:           37.1 mmHg MV E/A ratio:  2.98                            SHUNTS                            Systemic VTI:  0.12 m                            Systemic Diam: 2.30 cm Jenkins Rouge MD Electronically signed by Jenkins Rouge MD Signature Date/Time: 12/27/2020/11:18:17 AM    Final      Additional studies/ records that were reviewed today include:   ECHO; 09/17/2020 IMPRESSIONS   1. Mild hypokinesis of the distal septum with overall preserved LV  function; calcified aortic valve with mild AS by doppler but visually  looks moderate.   2. Left ventricular ejection fraction, by estimation, is 55 to 60%. The  left ventricle has normal function. The left ventricle demonstrates  regional wall motion abnormalities (see scoring diagram/findings for  description). There is mild left ventricular   hypertrophy. Left ventricular diastolic parameters are consistent with  Grade II diastolic dysfunction (pseudonormalization). Elevated left atrial  pressure.   3. Right ventricular systolic function is normal. The right ventricular  size is normal.   4. The mitral valve is  normal in structure. Trivial mitral valve  regurgitation. No evidence of mitral stenosis. Moderate mitral annular  calcification.   5. The aortic valve is calcified. Aortic valve regurgitation is not  visualized. Mild aortic valve stenosis.   6. The inferior vena cava is normal in size  with greater than 50%  respiratory variability, suggesting right atrial pressure of 3 mmHg.   FINDINGS   Left Ventricle: Left ventricular ejection fraction, by estimation, is 55  to 60%. The left ventricle has normal function. The left ventricle  demonstrates regional wall motion abnormalities. Definity contrast agent  was given IV to delineate the left  ventricular endocardial borders. The left ventricular internal cavity size  was normal in size. There is mild left ventricular hypertrophy. Left  ventricular diastolic parameters are consistent with Grade II diastolic  dysfunction (pseudonormalization).  Elevated left atrial pressure.   Right Ventricle: The right ventricular size is normal. Right ventricular  systolic function is normal.   Left Atrium: Left atrial size was normal in size.   Right Atrium: Right atrial size was normal in size.   Pericardium: There is no evidence of pericardial effusion.   Mitral Valve: The mitral valve is normal in structure. Moderate mitral  annular calcification. Trivial mitral valve regurgitation. No evidence of  mitral valve stenosis.   Tricuspid Valve: The tricuspid valve is normal in structure. Tricuspid  valve regurgitation is trivial. No evidence of tricuspid stenosis.   Aortic Valve: The aortic valve is calcified. Aortic valve regurgitation is  not visualized. Mild aortic stenosis is present. Aortic valve mean  gradient measures 14.0 mmHg. Aortic valve peak gradient measures 24.9  mmHg. Aortic valve area, by VTI measures  1.88 cm.   Pulmonic Valve: The pulmonic valve was normal in structure. Pulmonic valve  regurgitation is not visualized. No evidence of pulmonic stenosis.   Aorta: The aortic root is normal in size and structure.   Venous: The inferior vena cava is normal in size with greater than 50%  respiratory variability, suggesting right atrial pressure of 3 mmHg.      Additional Comments: Mild hypokinesis of the distal  septum with overall  preserved LV function; calcified aortic valve with mild AS by doppler but  visually looks moderate.      LEFT VENTRICLE  PLAX 2D  LVIDd:         4.40 cm      Diastology  LVIDs:         3.20 cm      LV e' medial:    3.38 cm/s  LV PW:         1.30 cm      LV E/e' medial:  25.7  LV IVS:        1.30 cm      LV e' lateral:   4.82 cm/s  LVOT diam:     2.20 cm      LV E/e' lateral: 18.0  LV SV:         97  LV SV Index:   50  LVOT Area:     3.80 cm     LV Volumes (MOD)  LV vol d, MOD A2C: 54.1 ml  LV vol d, MOD A4C: 119.0 ml  LV vol s, MOD A2C: 26.2 ml  LV vol s, MOD A4C: 55.2 ml  LV SV MOD A2C:     27.9 ml  LV SV MOD A4C:     119.0 ml  LV SV MOD BP:  46.1 ml   RIGHT VENTRICLE             IVC  RV S prime:     10.50 cm/s  IVC diam: 2.00 cm  TAPSE (M-mode): 2.4 cm   LEFT ATRIUM             Index       RIGHT ATRIUM           Index  LA diam:        4.60 cm 2.35 cm/m  RA Area:     13.10 cm  LA Vol (A2C):   28.8 ml 14.72 ml/m RA Volume:   27.20 ml  13.90 ml/m  LA Vol (A4C):   57.6 ml 29.44 ml/m  LA Biplane Vol: 42.7 ml 21.82 ml/m   AORTIC VALVE  AV Area (Vmax):    2.20 cm  AV Area (Vmean):   1.98 cm  AV Area (VTI):     1.88 cm  AV Vmax:           249.33 cm/s  AV Vmean:          173.000 cm/s  AV VTI:            0.515 m  AV Peak Grad:      24.9 mmHg  AV Mean Grad:      14.0 mmHg  LVOT Vmax:         144.50 cm/s  LVOT Vmean:        90.200 cm/s  LVOT VTI:          0.255 m  LVOT/AV VTI ratio: 0.50     AORTA  Ao Root diam: 3.60 cm  Ao Asc diam:  3.70 cm   MITRAL VALVE  MV Area (PHT): 3.03 cm    SHUNTS  MV Decel Time: 250 msec    Systemic VTI:  0.26 m  MV E velocity: 87.00 cm/s  Systemic Diam: 2.20 cm  MV A velocity: 57.60 cm/s  MV E/A ratio:  1.51   CATH: 09/20/2020 Conclusions: Severe three-vessel coronary artery disease, including chronic total occlusion of proximal LAD with left-to-left collaterals, 75% ostial/proximal ramus intermedius  stenosis, and sequential 75% ostial and 90% distal RCA lesions.  LCx and OM3 have mild to moderate disease of up to 40%. Moderately elevated left ventricular filling pressure (LVEDP ~30 mmHg). Mild aortic stenosis (peak-to-peak gradient 10-15 mmHg.   Recommendations: Imaged reviewed with Dr. Burt Knack during the procedure.  We will plan for cardiac surgery consultation for CABG given severe LAD and RCA disease with good distal targets in both vessels. Defer reinitiation of IV heparin, as the coronary artery disease appears chronic and hemoglobin has trended down significantly since admission. Aggressive secondary prevention. Diurese with furosemide 40 mg IV x 1.  Further diureses to be based on urine output, volume status, and renal function.       ECHO: 12/27/2020 IMPRESSIONS   1. Severe ischemic DCM post CABG global hypokinesis worse in septum and  apex . Left ventricular ejection fraction, by estimation, is 25 to 30%.  The left ventricle has severely decreased function. The left ventricle  demonstrates global hypokinesis. Left  ventricular diastolic parameters are indeterminate.   2. Right ventricular systolic function is normal. The right ventricular  size is normal. There is mildly elevated pulmonary artery systolic  pressure.   3. Left atrial size was mildly dilated.   4. The mitral valve is degenerative. Trivial mitral valve regurgitation.  No evidence of mitral stenosis. Moderate  mitral annular calcification.   5. Morphologically patient has significant AS Limited leaflet motion  severely calcified DVI 0.25 AVA 1 cm2 Suspect he would benefit from TAVR  Dr Burt Knack has reviewed case before At cath prior to CABG peak to peak  gradient only 10-15 mHg S Suggest  structural heart team review again . The aortic valve is calcified. There  is severe calcifcation of the aortic valve. There is severe thickening of  the aortic valve. Aortic valve regurgitation is not visualized. Moderate   to severe aortic valve stenosis.   6. The inferior vena cava is normal in size with greater than 50%  respiratory variability, suggesting right atrial pressure of 3 mmHg.   ASSESSMENT:    1. Cardiac arrest ([place]): 09/16/2020   2. CAD in native artery   3. S/P CABG (coronary artery bypass graft)   4. Chronic combined systolic and diastolic heart failure ([place])   5. Essential hypertension   6. Aortic valve stenosis, etiology of cardiac valve disease unspecified   7. Hyperlipidemia LDL goal <70   8. Medication management   9. Type 2 diabetes mellitus with complication, without long-term current use of insulin [facility])     PLAN:  Brian Robinson is a 75 year old gentleman who has a history of hypertension and hyperlipidemia and suffered an out-of-hospital witnessed cardiac arrest on September 16, 2020.  He underwent prompt initial CPR and following countershock had return of ROSC.  He had initial anoxic encephalopathy and an initial echo Doppler study showed normal LV function and raise concern for at least mild aortic stenosis although visually it was felt possibly to be in the moderate range.  Following significant recovery of neurologic function he underwent elective cardiac catheterization and was was found to have severe multivessel CAD.  He underwent ultimate successful CABG revascularization surgery by Dr. Roxan Hockey with a LIMA to LAD, SVG to PDA, and SVG to diagonal.  He developed postoperative atrial fibrillation and required amiodarone therapy.  He was ultimately discharged with a LifeVest.  He was subsequently started on Entresto and unfortunately developed a severe maculopapular rash after his dose was increased leading to discontinuance on December 24, 2020.  Follow-up for my initial post hospital evaluation with me on December 29, 2020, and he had received just 1 dose of losartan the day previously.  I personally reviewed his echo images which showed severe reduction in AV excursion in the  setting of his EF of 25 to 30% highly suggestive of severe low gradient aortic stenosis.  I change his medications, and discontinued a atenolol, furosemide, supplemental potassium, and recommended titration of losartan, initiation of metoprolol succinate and initiation of low-dose spironolactone.  Presently, he feels improved.  His blood pressure today is elevated on his current regimen of losartan 25 mg twice a day and I will now increase this to 50 mg in the morning and 25 mg in the evening.  His resting pulse is in the mid 70s on metoprolol succinate 25 mg twice a day and I have also suggested he increase this to 50 mg in the morning and 25 mg in the evening.  He tells me that his LifeVest will be expiring at the end of the month and we will need to extend his LifeVest which he should wear until his TAVR and probable subsequent ICD implantation if LV function does not improve.  I have recommended he wear support stockings.  He will monitor his blood pressure and heart rate.  He is scheduled to  see Dr. Burt Knack in November for 2022 for TAVR consideration.  He will then need to see Dr. Gilford Raid felt appropriate.  His maculopapular rash from [facility] has subsided but he did develop a mild rash around his LifeVest which may be perspiration mediated.  He is being followed by dermatology.  Following his medication adjustment today, may be a candidate for SGLT2 inhibition at his upcoming and subsequent visits.  Laboratory from January 10, 2021 showed an LDL of 121.  If he has not been on statin therapy this will need to be initiated with atorvastatin or rosuvastatin 40 mg daily. He continues to be on metformin 500 mg twice a day for his diabetes mellitus.  I will see him in follow-up in several months following his pending evaluations and procedures.   Medication Adjustments/Labs and Tests Ordered: Current medicines are reviewed at length with the patient today.  Concerns regarding medicines are outlined above.   Medication changes, Labs and Tests ordered today are listed in the Patient Instructions below. Patient Instructions  Medication Instructions:  Take losartan 2 tablets in the morning and take one tablet in the evening.  Take metoprolol 1 tablet in morning and one-half tablet in the evening.  *If you need a refill on your cardiac medications before your next appointment, please call your pharmacy*   Follow-Up: At [facility], you and your health needs are our priority.  As part of our continuing mission to provide you with exceptional heart care, we have created designated Provider Care Teams.  These Care Teams include your primary Cardiologist (physician) and Advanced Practice Providers (APPs -  Physician Assistants and Nurse Practitioners) who all work together to provide you with the care you need, when you need it.  We recommend signing up for the patient portal called "MyChart".  Sign up information is provided on this After Visit Summary.  MyChart is used to connect with patients for Virtual Visits (Telemedicine).  Patients are able to view lab/test results, encounter notes, upcoming appointments, etc.  Non-urgent messages can be sent to your provider as well.   To learn more about what you can do with MyChart, go to [email or web address].    Your next appointment:   3 month(s)  The format for your next appointment:   In Person  Provider:   Shelva Majestic, MD   Other Instructions Wear support stockings.  Call us with the number for the life vest so we can extend the use time for another 3 months.   Signed, Shelva Majestic, MD  01/24/2021 5:26 PM    [facility] [address], [place], [place], [place]  [address] Phone: [phone]

## 2021-01-18 NOTE — Patient Instructions (Signed)
Medication Instructions:  Take losartan 2 tablets in the morning and take one tablet in the evening.  Take metoprolol 1 tablet in morning and one-half tablet in the evening.  *If you need a refill on your cardiac medications before your next appointment, please call your pharmacy*   Follow-Up: At [facility], you and your health needs are our priority.  As part of our continuing mission to provide you with exceptional heart care, we have created designated Provider Care Teams.  These Care Teams include your primary Cardiologist (physician) and Advanced Practice Providers (APPs -  Physician Assistants and Nurse Practitioners) who all work together to provide you with the care you need, when you need it.  We recommend signing up for the patient portal called "MyChart".  Sign up information is provided on this After Visit Summary.  MyChart is used to connect with patients for Virtual Visits (Telemedicine).  Patients are able to view lab/test results, encounter notes, upcoming appointments, etc.  Non-urgent messages can be sent to your provider as well.   To learn more about what you can do with MyChart, go to [email or web address].    Your next appointment:   3 month(s)  The format for your next appointment:   In Person  Provider:   Shelva Majestic, MD   Other Instructions Wear support stockings.  Call us with the number for the life vest so we can extend the use time for another 3 months.

## 2021-01-19 ENCOUNTER — Ambulatory Visit: Payer: Medicare Other | Admitting: General Practice

## 2021-01-24 ENCOUNTER — Encounter: Payer: Self-pay | Admitting: Cardiovascular Disease

## 2021-01-25 NOTE — Telephone Encounter (Signed)
Please see message response from pt and advise if needed.   Pt wife states new medications not helping with BP and pt leg is still a little swollen. He is avoiding salt, but wife will have pt elevate legs more often. [employer] did tell them to stop taking Entresto as pt already had done.

## 2021-01-26 DIAGNOSIS — L259 Unspecified contact dermatitis, unspecified cause: Secondary | ICD-10-CM | POA: Diagnosis not present

## 2021-01-26 DIAGNOSIS — Z23 Encounter for immunization: Secondary | ICD-10-CM | POA: Diagnosis not present

## 2021-01-26 DIAGNOSIS — L817 Pigmented purpuric dermatosis: Secondary | ICD-10-CM | POA: Diagnosis not present

## 2021-01-27 DIAGNOSIS — E785 Hyperlipidemia, unspecified: Secondary | ICD-10-CM | POA: Diagnosis not present

## 2021-01-27 DIAGNOSIS — E1169 Type 2 diabetes mellitus with other specified complication: Secondary | ICD-10-CM | POA: Diagnosis not present

## 2021-01-27 DIAGNOSIS — G4700 Insomnia, unspecified: Secondary | ICD-10-CM | POA: Diagnosis not present

## 2021-01-27 DIAGNOSIS — I10 Essential (primary) hypertension: Secondary | ICD-10-CM | POA: Diagnosis not present

## 2021-01-27 DIAGNOSIS — I5040 Unspecified combined systolic (congestive) and diastolic (congestive) heart failure: Secondary | ICD-10-CM | POA: Diagnosis not present

## 2021-01-27 DIAGNOSIS — I2510 Atherosclerotic heart disease of native coronary artery without angina pectoris: Secondary | ICD-10-CM | POA: Diagnosis not present

## 2021-01-27 DIAGNOSIS — E7800 Pure hypercholesterolemia, unspecified: Secondary | ICD-10-CM | POA: Diagnosis not present

## 2021-01-27 DIAGNOSIS — E119 Type 2 diabetes mellitus without complications: Secondary | ICD-10-CM | POA: Diagnosis not present

## 2021-01-28 ENCOUNTER — Encounter: Payer: Self-pay | Admitting: Cardiovascular Disease

## 2021-01-28 ENCOUNTER — Other Ambulatory Visit: Payer: Self-pay

## 2021-01-28 ENCOUNTER — Ambulatory Visit (INDEPENDENT_AMBULATORY_CARE_PROVIDER_SITE_OTHER): Payer: Medicare Other | Admitting: Cardiovascular Disease

## 2021-01-28 VITALS — BP 140/100 | HR 59 | Ht 67.0 in | Wt 196.8 lb

## 2021-01-28 DIAGNOSIS — I2510 Atherosclerotic heart disease of native coronary artery without angina pectoris: Secondary | ICD-10-CM | POA: Diagnosis not present

## 2021-01-28 DIAGNOSIS — I350 Nonrheumatic aortic (valve) stenosis: Secondary | ICD-10-CM | POA: Diagnosis not present

## 2021-01-28 DIAGNOSIS — I5042 Chronic combined systolic (congestive) and diastolic (congestive) heart failure: Secondary | ICD-10-CM

## 2021-01-28 DIAGNOSIS — I10 Essential (primary) hypertension: Secondary | ICD-10-CM

## 2021-01-28 LAB — CBC
Hematocrit: 38.7 % (ref 37.5–51.0)
Hemoglobin: 12.7 g/dL — ABNORMAL LOW (ref 13.0–17.7)
MCH: 28.6 pg (ref 26.6–33.0)
MCHC: 32.8 g/dL (ref 31.5–35.7)
MCV: 87 fL (ref 79–97)
Platelets: 209 10*3/uL (ref 150–450)
RBC: 4.44 x10E6/uL (ref 4.14–5.80)
RDW: 13.2 % (ref 11.6–15.4)
WBC: 7.2 10*3/uL (ref 3.4–10.8)

## 2021-01-28 LAB — BASIC METABOLIC PANEL
BUN/Creatinine Ratio: 14 (ref 10–24)
BUN: 13 mg/dL (ref 8–27)
CO2: 25 mmol/L (ref 20–29)
Calcium: 8.8 mg/dL (ref 8.6–10.2)
Chloride: 96 mmol/L (ref 96–106)
Creatinine, Ser: 0.95 mg/dL (ref 0.76–1.27)
Glucose: 83 mg/dL (ref 70–99)
Potassium: 4 mmol/L (ref 3.5–5.2)
Sodium: 135 mmol/L (ref 134–144)
eGFR: 83 mL/min/{1.73_m2} (ref >59)

## 2021-01-28 MED ORDER — ROSUVASTATIN CALCIUM 20 MG PO TABS: 20.0000 mg | ORAL_TABLET | Freq: Every day | ORAL | 3 refills | Status: DC

## 2021-01-28 MED ORDER — EMPAGLIFLOZIN 10 MG PO TABS: 10.0000 mg | ORAL_TABLET | Freq: Every day | ORAL | 11 refills | Status: DC

## 2021-01-28 MED ORDER — LOSARTAN POTASSIUM 100 MG PO TABS: 100.0000 mg | ORAL_TABLET | Freq: Every day | ORAL | 3 refills | Status: DC

## 2021-01-28 NOTE — H&P (View-Only) (Signed)
Cardiology Office Note:    Date:  01/30/2021   ID:  Brian Robinson, DOB 11-Jun-1945, MRN [id number]  PCP:  Seward Carol, MD   [facility] Providers Cardiologist:  Shelva Majestic, MD     Referring MD: Seward Carol, MD   Chief Complaint  Patient presents with   Shortness of Breath     History of Present Illness:    Brian Robinson is a 75 y.o. male presenting for evaluation of severe low-flow low gradient aortic stenosis.  The patient's cardiac history dates back to June 2022 when he presented with a witnessed out of hospital cardiac arrest.  The patient was successfully resuscitated and underwent catheterization after he improved enough from a neurologic perspective.  An echocardiogram during that hospitalization showed preserved LV systolic function with an LVEF of 55 to 17%, grade 2 diastolic dysfunction, mild hypokinesis of the septum, and a severely calcified aortic valve with what appeared to be mild aortic stenosis.  Cardiac catheterization demonstrated severe multivessel coronary artery disease with chronic occlusion of the LAD severe stenoses of the ramus intermedius, and severe sequential stenoses in the right coronary artery.  He ultimately was treated with multivessel CABG using a LIMA to LAD, saphenous vein graft to PDA, and saphenous vein graft to diagonal.  There was careful discussion between cardiac surgery and cardiology about the patient's aortic stenosis.  It was ultimately deemed best to defer aortic valve replacement as his aortic stenosis appeared mild to moderate by hemodynamic assessment and the patient was felt to be a poor candidate for a longer pumper on because of his out of hospital arrest and subsequent anoxic encephalopathy.  He was ultimately discharged from the hospital following coronary bypass surgery.  He has been fitted for a LifeVest and is compliant with this.  He did fairly well early on after CABG, but he has developed progressive exertional dyspnea and  fatigue over the last several weeks.  He denies orthopnea, PND, chest pain, chest pressure, lightheadedness, or syncope.  He has had elevated blood pressure and his medications have been adjusted by Dr. Claiborne Billings.  The patient is edentulous and has full upper and lower dentures.  The patient is here with his wife today.  They report that they have only known of him having a heart murmur over the last year or 2.  Past Medical History:  Diagnosis Date   COVID-19    received Mab 01/2020   Diabetes mellitus without complication ([place])    Heart murmur    Hypertension    Mild aortic stenosis     Past Surgical History:  Procedure Laterality Date   CORONARY ARTERY BYPASS GRAFT N/A 09/23/2020   Procedure: CORONARY ARTERY BYPASS GRAFTING (CABG), ON PUMP, TIMES THREE, USING LEFT INTERNAL MAMMARY ARTERY AND LEFT ENDOSCOPICALLY HARVESTED GREATER SAPHENOUS VEIN;  Surgeon: [name], MD;  Location: [place];  Service: Open Heart Surgery;  Laterality: N/A;   ENDOVEIN HARVEST OF GREATER SAPHENOUS VEIN Left 09/23/2020   Procedure: ENDOVEIN HARVEST OF GREATER SAPHENOUS VEIN;  Surgeon: [name], MD;  Location: [place];  Service: Open Heart Surgery;  Laterality: Left;   LEFT HEART CATH AND CORONARY ANGIOGRAPHY N/A 09/20/2020   Procedure: LEFT HEART CATH AND CORONARY ANGIOGRAPHY;  Surgeon: Nelva Bush, MD;  Location: [place] [facility];  Service: Cardiovascular;  Laterality: N/A;   TEE WITHOUT CARDIOVERSION N/A 09/23/2020   Procedure: TRANSESOPHAGEAL ECHOCARDIOGRAM (TEE);  Surgeon: [name], MD;  Location: [place];  Service: Open Heart Surgery;  Laterality: N/A;    Current Medications: Current Meds  Medication Sig   acetaminophen (TYLENOL) 500 MG tablet Take 2 tablets (1,000 mg total) by mouth every 6 (six) hours as needed.   ALPRAZolam (XANAX) 0.25 MG tablet Take 0.25 mg by mouth daily as needed.   aspirin 81 MG EC tablet Take 1 tablet (81 mg total) by mouth daily.    betamethasone dipropionate 0.05 % cream Apply 0.05 mg topically 2 (two) times daily.   diphenhydrAMINE (BENADRYL) 25 mg capsule Take 25 mg by mouth every 6 (six) hours as needed for allergies or sleep.   empagliflozin (JARDIANCE) 10 MG TABS tablet Take 1 tablet (10 mg total) by mouth daily before breakfast.   ferrous sulfate 325 (65 FE) MG tablet Take by mouth daily with breakfast. 65 mg daily   losartan (COZAAR) 100 MG tablet Take 1 tablet (100 mg total) by mouth daily.   metFORMIN (GLUCOPHAGE) 500 MG tablet Take 500 mg by mouth 2 (two) times daily with a meal.   metoprolol succinate (TOPROL XL) 50 MG 24 hr tablet Take 1 tablet in the morning and one-half tablet in the evening   Multiple Vitamins-Minerals (PRESERVISION AREDS 2) CAPS Take 1 capsule by mouth 2 (two) times daily.   rosuvastatin (CRESTOR) 20 MG tablet Take 1 tablet (20 mg total) by mouth daily.   sertraline (ZOLOFT) 50 MG tablet Take 50 mg by mouth in the morning.   spironolactone (ALDACTONE) 25 MG tablet Take 0.5 tablets (12.5 mg total) by mouth daily.   [DISCONTINUED] losartan (COZAAR) 25 MG tablet Take 2 tablets in the morning and 1 tablet in the evening.     Allergies:   Sacubitril-valsartan   Social History   Socioeconomic History   Marital status: Married    Spouse name: Not on file   Number of children: Not on file   Years of education: Not on file   Highest education level: Not on file  Occupational History   Not on file  Tobacco Use   Smoking status: Never   Smokeless tobacco: Never  Substance and Sexual Activity   Alcohol use: Not on file   Drug use: Not on file   Sexual activity: Not on file  Other Topics Concern   Not on file  Social History Narrative   Not on file   Social Determinants of Health   Financial Resource Strain: Not on file  Food Insecurity: Not on file  Transportation Needs: Not on file  Physical Activity: Not on file  Stress: Not on file  Social Connections: Not on file      Family History: The patient's family history includes Cancer in his father and mother. There is no history of CAD.  ROS:   Please see the history of present illness.    Positive for mild memory impairment, leg swelling, shortness of breath.  All other systems reviewed and are negative.  EKGs/Labs/Other Studies Reviewed:    The following studies were reviewed today: Echo 12-27-20: FINDINGS   Left Ventricle: Severe ischemic DCM post CABG global hypokinesis worse in  septum and apex. Left ventricular ejection fraction, by estimation, is 25  to 30%. The left ventricle has severely decreased function. The left  ventricle demonstrates global  hypokinesis. The left ventricular internal cavity size was normal in size.  There is no left ventricular hypertrophy. Left ventricular diastolic  parameters are indeterminate.   Right Ventricle: The right ventricular size is normal. No increase in  right ventricular wall thickness. Right  ventricular systolic function is  normal. There is mildly elevated pulmonary artery systolic pressure. The  tricuspid regurgitant velocity is 2.92   m/s, and with an assumed right atrial pressure of 3 mmHg, the estimated  right ventricular systolic pressure is 78.6 mmHg.   Left Atrium: Left atrial size was mildly dilated.   Right Atrium: Right atrial size was normal in size.   Pericardium: There is no evidence of pericardial effusion.   Mitral Valve: The mitral valve is degenerative in appearance. There is  moderate thickening of the mitral valve leaflet(s). There is moderate  calcification of the mitral valve leaflet(s). Moderate mitral annular  calcification. Trivial mitral valve  regurgitation. No evidence of mitral valve stenosis.   Tricuspid Valve: The tricuspid valve is normal in structure. Tricuspid  valve regurgitation is not demonstrated. No evidence of tricuspid  stenosis.   Aortic Valve: Morphologically patient has significant AS Limited  leaflet  motion severely calcified DVI 0.25 AVA 1 cm2 Suspect he would benefit from  TAVR Dr Burt Knack has reviewed case before At cath prior to CABG peak to peak  gradient only 10-15 mHg S  Suggest structural heart team review again. The aortic valve is calcified.  There is severe calcifcation of the aortic valve. There is severe  thickening of the aortic valve. Aortic valve regurgitation is not  visualized. Moderate to severe aortic stenosis  is present. Aortic valve mean gradient measures 11.0 mmHg. Aortic valve  peak gradient measures 19.2 mmHg. Aortic valve area, by VTI measures 1.04  cm.   Pulmonic Valve: The pulmonic valve was normal in structure. Pulmonic valve  regurgitation is not visualized. No evidence of pulmonic stenosis.   Aorta: The aortic root is normal in size and structure.   Venous: The inferior vena cava is normal in size with greater than 50%  respiratory variability, suggesting right atrial pressure of 3 mmHg.   IAS/Shunts: No atrial level shunt detected by color flow Doppler.      LEFT VENTRICLE  PLAX 2D  LVIDd:         5.30 cm  Diastology  LVIDs:         4.40 cm  LV e' medial:    3.26 cm/s  LV PW:         0.90 cm  LV E/e' medial:  25.9  LV IVS:        0.80 cm  LV e' lateral:   7.07 cm/s  LVOT diam:     2.30 cm  LV E/e' lateral: 11.9  LV SV:         48  LV SV Index:   24  LVOT Area:     4.15 cm                             3D Volume EF:                          3D EF:        56 %                          LV EDV:       164 ml                          LV ESV:       72 ml  LV SV:        93 ml   RIGHT VENTRICLE  RV Basal diam:  4.50 cm  RV Mid diam:    3.60 cm  RV S prime:     7.51 cm/s  TAPSE (M-mode): 1.5 cm  RVSP:           37.1 mmHg   LEFT ATRIUM             Index       RIGHT ATRIUM           Index  LA diam:        4.00 cm 1.97 cm/m  RA Pressure: 3.00 mmHg  LA Vol (A2C):   79.9 ml 39.26 ml/m RA Area:     17.70 cm  LA  Vol (A4C):   63.6 ml 31.25 ml/m RA Volume:   49.90 ml  24.52 ml/m  LA Biplane Vol: 72.0 ml 35.38 ml/m   AORTIC VALVE  AV Area (Vmax):    1.06 cm  AV Area (Vmean):   0.95 cm  AV Area (VTI):     1.04 cm  AV Vmax:           219.00 cm/s  AV Vmean:          156.000 cm/s  AV VTI:            0.464 m  AV Peak Grad:      19.2 mmHg  AV Mean Grad:      11.0 mmHg  LVOT Vmax:         56.10 cm/s  LVOT Vmean:        35.600 cm/s  LVOT VTI:          0.116 m  LVOT/AV VTI ratio: 0.25     AORTA  Ao Root diam: 3.20 cm  Ao Asc diam:  3.60 cm   MITRAL VALVE               TRICUSPID VALVE  MV Area (PHT):             TR Peak grad:   34.1 mmHg  MV Decel Time:             TR Vmax:        292.00 cm/s  MV E velocity: 84.40 cm/s  Estimated RAP:  3.00 mmHg  MV A velocity: 28.30 cm/s  RVSP:           37.1 mmHg  MV E/A ratio:  2.98                             SHUNTS                             Systemic VTI:  0.12 m                             Systemic Diam: 2.30 cm   Cardiac Cath 09/20/20: Conclusions: Severe three-vessel coronary artery disease, including chronic total occlusion of proximal LAD with left-to-left collaterals, 75% ostial/proximal ramus intermedius stenosis, and sequential 75% ostial and 90% distal RCA lesions.  LCx and OM3 have mild to moderate disease of up to 40%. Moderately elevated left ventricular filling pressure (LVEDP ~30 mmHg). Mild aortic stenosis (peak-to-peak gradient 10-15 mmHg.   Recommendations: Imaged reviewed with Dr. Burt Knack during the procedure.  We  will plan for cardiac surgery consultation for CABG given severe LAD and RCA disease with good distal targets in both vessels. Defer reinitiation of IV heparin, as the coronary artery disease appears chronic and hemoglobin has trended down significantly since admission. Aggressive secondary prevention. Diurese with furosemide 40 mg IV x 1.  Further diureses to be based on urine output, volume status, and renal  function.  Diagnostic Dominance: Right Left Main  Vessel is large.  Left Anterior Descending  Vessel is large.  Ost LAD to Prox LAD lesion is 75% stenosed.  Prox LAD lesion is 100% stenosed. The lesion is chronically occluded.  First Diagonal Branch  Vessel is large in size.  1st Diag lesion is 75% stenosed.  Ramus Intermedius  Vessel is moderate in size.  Ramus lesion is 75% stenosed.  Left Circumflex  Vessel is large.  Prox Cx lesion is 40% stenosed.  First Obtuse Marginal Branch  Vessel is small in size.  Second Obtuse Marginal Branch  Vessel is large in size.  Third Obtuse Marginal Branch  Vessel is large in size.  3rd Mrg lesion is 30% stenosed.  Right Coronary Artery  Ost RCA lesion is 75% stenosed. The lesion is calcified.  Dist RCA lesion is 90% stenosed.  Right Posterior Descending Artery  Vessel is moderate in size.  Right Posterior Atrioventricular Artery  Vessel is moderate in size.  Intervention  No interventions have been documented. Left Heart  Left Ventricle LV end diastolic pressure is moderately elevated. LVEDP ~30 mmHg.  Aortic Valve There is mild aortic valve stenosis.   Coronary Diagrams   Diagnostic Dominance: Right     EKG:  EKG is not ordered today.    Recent Labs: 11/08/2020: Magnesium 1.7 01/10/2021: ALT 14; TSH 2.740 01/28/2021: BUN 13; Creatinine, Ser 0.95; Hemoglobin 12.7; Platelets 209; Potassium 4.0; Sodium 135  Recent Lipid Panel    Component Value Date/Time   CHOL 178 01/10/2021 1013   TRIG 66 01/10/2021 1013   HDL 44 01/10/2021 1013   CHOLHDL 4.0 01/10/2021 1013   CHOLHDL 3.5 09/18/2020 0023   VLDL 19 09/18/2020 0023   LDLCALC 121 (H) 01/10/2021 1013     Risk Assessment/Calculations:                Physical Exam:    VS:  BP (!) 140/100 Comment: right arm BP 140/100  Pulse (!) 59   Ht 5\' 7"  (1.702 m)   Wt 196 lb 12.8 oz (89.3 kg)   SpO2 95%   BMI 30.82 kg/m     Wt Readings from Last 3 Encounters:   01/28/21 196 lb 12.8 oz (89.3 kg)  01/18/21 196 lb (88.9 kg)  12/29/20 196 lb 12.8 oz (89.3 kg)     GEN:  Well nourished, well developed in no acute distress HEENT: Normal NECK: No JVD; No carotid bruits LYMPHATICS: No lymphadenopathy CARDIAC: RRR, 2/6 systolic murmur at the RUSB RESPIRATORY:  Clear to auscultation without rales, wheezing or rhonchi  ABDOMEN: Soft, non-tender, non-distended MUSCULOSKELETAL:  No edema; No deformity  SKIN: Warm and dry NEUROLOGIC:  Alert and oriented x 3 PSYCHIATRIC:  Normal affect   ASSESSMENT:    1. CAD in native artery   2. Aortic valve stenosis, etiology of cardiac valve disease unspecified   3. Essential hypertension   4. Chronic combined systolic and diastolic heart failure (HCC)    PLAN:    In order of problems listed above:  Stable without symptoms of angina, recent CABG.  Treated with  aspirin for antiplatelet therapy, losartan and metoprolol succinate.  We will add a high intensity statin drug with rosuvastatin 20 mg daily. The patient's echo studies are reviewed.  We had extensive discussion about his case while he was hospitalized with specific discussion about whether or not to proceed with aortic valve replacement at the time of CABG.  The decision was made to defer aortic valve replacement because of concern about the patient's anoxic encephalopathy and risk for neurologic complications with a longer cardiopulmonary bypass run and more extensive surgery.  In addition, the patient's Doppler and hemodynamic assessment suggested no more than mild to moderate aortic stenosis at the time.  However, the patient now has more severe LV dysfunction with LVEF less than 30%.  His echo parameters are concerning for severe low-flow low gradient aortic stenosis.  The aortic valve leaflets are severely calcified and restricted.  The dimensionless index is 0.25 and calculated aortic valve area is 1 cm.  Peak and mean transvalvular gradients are only 19  and 11 mmHg, respectively.  Stroke-volume index is low at 24.  We had extensive discussion about the natural history of aortic stenosis today.  The patient and his wife understand that it can be more difficult to assess and accurately characterize the degree of aortic stenosis in the setting of severe LV dysfunction.  It is concerning that he has progressive now New York Heart Association functional class III symptoms of exertional dyspnea consistent with chronic systolic heart failure.  Further evaluation is indicated.  I have recommended right and left heart cath with graft angiography to assess for both graft patency and reassess the patient's aortic stenosis.  Patient will also undergo a CT angiogram of the heart and the chest, abdomen, and pelvis to assess for anatomic suitability for TAVR.  Once his studies are completed he will be referred to Dr. Cyndia Bent as part of a multidisciplinary approach to his care. Blood pressure remains uncontrolled.  He will increase losartan to 100 mg daily. Reviewed Dr. Evette Georges notes.  We will add Jardiance.    Cardiac Rehabilitation Eligibility Assessment       Shared Decision Making/Informed Consent The risks [stroke (1 in 1000), death (1 in 12), kidney failure [usually temporary] (1 in 500), bleeding (1 in 200), allergic reaction [possibly serious] (1 in 200)], benefits (diagnostic support and management of coronary artery disease) and alternatives of a cardiac catheterization were discussed in detail with Mr. Naumann and he is willing to proceed.    Medication Adjustments/Labs and Tests Ordered: Current medicines are reviewed at length with the patient today.  Concerns regarding medicines are outlined above.  Orders Placed This Encounter  Procedures   Lipid panel   Hepatic function panel   Basic metabolic panel   CBC   Meds ordered this encounter  Medications   rosuvastatin (CRESTOR) 20 MG tablet    Sig: Take 1 tablet (20 mg total) by mouth daily.     Dispense:  90 tablet    Refill:  3   losartan (COZAAR) 100 MG tablet    Sig: Take 1 tablet (100 mg total) by mouth daily.    Dispense:  90 tablet    Refill:  3    Dose change   empagliflozin (JARDIANCE) 10 MG TABS tablet    Sig: Take 1 tablet (10 mg total) by mouth daily before breakfast.    Dispense:  30 tablet    Refill:  11    Patient Instructions  Medication Instructions:  1) START  Rosuvastatin 20mg  once daily 2) INCREASE Losartan to 100mg  once daily 3) START Jardiance 10mg  once daily  *If you need a refill on your cardiac medications before your next appointment, please call your pharmacy*   Lab Work: BMET and CBC today  Lipid and Liver in 8 weeks.  You will need to be fasting for these labs (nothing to eat or drink after midnight except water and black coffee).  If you have labs (blood work) drawn today and your tests are completely normal, you will receive your results only by: [facility] (if you have MyChart) OR A paper copy in the mail If you have any lab test that is abnormal or we need to change your treatment, we will call you to review the results.   Testing/Procedures: Your physician has requested that you have a cardiac catheterization. Cardiac catheterization is used to diagnose and/or treat various heart conditions. Doctors may recommend this procedure for a number of different reasons. The most common reason is to evaluate chest pain. Chest pain can be a symptom of coronary artery disease (CAD), and cardiac catheterization can show whether plaque is narrowing or blocking your heart's arteries. This procedure is also used to evaluate the valves, as well as measure the blood flow and oxygen levels in different parts of your heart. For further information please visit [email or web address]. Please follow instruction sheet, as given.   Follow-Up:  The structural team will be in contact with you after your heart catheterization.   Other Instructions   [place] OFFICE [place], [place] [address] Dept: [phone] Loc: [phone]  Khoen Genet  01/28/2021  You are scheduled for a Cardiac Catheterization on Monday, November 11 with Dr. Sherren Mocha.  1. Please arrive at the [facility] (Main Entrance A) at [facility]: [address] [place],  [address] at 5:30 AM (This time is two hours before your procedure to ensure your preparation). Free valet parking service is available.   Special note: Every effort is made to have your procedure done on time. Please understand that emergencies sometimes delay scheduled procedures.  2. Diet: Do not eat solid foods after midnight.  The patient may have clear liquids until 5am upon the day of the procedure.  3. Labs: You will have labs drawn today  4. Medication instructions in preparation for your procedure:   Contrast Allergy: No  Do not take Diabetes Med Glucophage (Metformin) on the day of the procedure and HOLD 48 HOURS AFTER THE PROCEDURE.    Do not take your Spironolactone or Jardiance the morning of the procedure.  On the morning of your procedure, take your Aspirin and any morning medicines NOT listed above.  You may use sips of water.  5. Plan for one night stay--bring personal belongings. 6. Bring a current list of your medications and current insurance cards. 7. You MUST have a responsible person to drive you home. 8. Someone MUST be with you the first 24 hours after you arrive home or your discharge will be delayed. 9. Please wear clothes that are easy to get on and off and wear slip-on shoes.  Thank you for allowing [place] to care for you!   -- [facility] Invasive Cardiovascular services     Signed, Sherren Mocha, MD  01/30/2021 3:11 PM    [place]

## 2021-01-28 NOTE — Progress Notes (Signed)
Cardiology Office Note:    Date:  01/30/2021   ID:  Brian Robinson, DOB [birth year], MRN [id number]  PCP:  Seward Carol, MD   [facility] Providers Cardiologist:  Shelva Majestic, MD     Referring MD: Seward Carol, MD   Chief Complaint  Patient presents with   Shortness of Breath     History of Present Illness:    Brian Robinson is a 75 y.o. male presenting for evaluation of severe low-flow low gradient aortic stenosis.  The patient's cardiac history dates back to June 2022 when he presented with a witnessed out of hospital cardiac arrest.  The patient was successfully resuscitated and underwent catheterization after he improved enough from a neurologic perspective.  An echocardiogram during that hospitalization showed preserved LV systolic function with an LVEF of 55 to 62%, grade 2 diastolic dysfunction, mild hypokinesis of the septum, and a severely calcified aortic valve with what appeared to be mild aortic stenosis.  Cardiac catheterization demonstrated severe multivessel coronary artery disease with chronic occlusion of the LAD severe stenoses of the ramus intermedius, and severe sequential stenoses in the right coronary artery.  He ultimately was treated with multivessel CABG using a LIMA to LAD, saphenous vein graft to PDA, and saphenous vein graft to diagonal.  There was careful discussion between cardiac surgery and cardiology about the patient's aortic stenosis.  It was ultimately deemed best to defer aortic valve replacement as his aortic stenosis appeared mild to moderate by hemodynamic assessment and the patient was felt to be a poor candidate for a longer pumper on because of his out of hospital arrest and subsequent anoxic encephalopathy.  He was ultimately discharged from the hospital following coronary bypass surgery.  He has been fitted for a LifeVest and is compliant with this.  He did fairly well early on after CABG, but he has developed progressive exertional dyspnea and  fatigue over the last several weeks.  He denies orthopnea, PND, chest pain, chest pressure, lightheadedness, or syncope.  He has had elevated blood pressure and his medications have been adjusted by Dr. Claiborne Billings.  The patient is edentulous and has full upper and lower dentures.  The patient is here with his wife today.  They report that they have only known of him having a heart murmur over the last year or 2.  Past Medical History:  Diagnosis Date   COVID-19    received Mab 01/2020   Diabetes mellitus without complication ([place])    Heart murmur    Hypertension    Mild aortic stenosis     Past Surgical History:  Procedure Laterality Date   CORONARY ARTERY BYPASS GRAFT N/A 09/23/2020   Procedure: CORONARY ARTERY BYPASS GRAFTING (CABG), ON PUMP, TIMES THREE, USING LEFT INTERNAL MAMMARY ARTERY AND LEFT ENDOSCOPICALLY HARVESTED GREATER SAPHENOUS VEIN;  Surgeon: [name], MD;  Location: [place];  Service: Open Heart Surgery;  Laterality: N/A;   ENDOVEIN HARVEST OF GREATER SAPHENOUS VEIN Left 09/23/2020   Procedure: ENDOVEIN HARVEST OF GREATER SAPHENOUS VEIN;  Surgeon: [name], MD;  Location: [place];  Service: Open Heart Surgery;  Laterality: Left;   LEFT HEART CATH AND CORONARY ANGIOGRAPHY N/A 09/20/2020   Procedure: LEFT HEART CATH AND CORONARY ANGIOGRAPHY;  Surgeon: Nelva Bush, MD;  Location: [place] [facility];  Service: Cardiovascular;  Laterality: N/A;   TEE WITHOUT CARDIOVERSION N/A 09/23/2020   Procedure: TRANSESOPHAGEAL ECHOCARDIOGRAM (TEE);  Surgeon: [name], MD;  Location: [place];  Service: Open Heart Surgery;  Laterality: N/A;    Current Medications: Current Meds  Medication Sig   acetaminophen (TYLENOL) 500 MG tablet Take 2 tablets (1,000 mg total) by mouth every 6 (six) hours as needed.   ALPRAZolam (XANAX) 0.25 MG tablet Take 0.25 mg by mouth daily as needed.   aspirin 81 MG EC tablet Take 1 tablet (81 mg total) by mouth daily.    betamethasone dipropionate 0.05 % cream Apply 0.05 mg topically 2 (two) times daily.   diphenhydrAMINE (BENADRYL) 25 mg capsule Take 25 mg by mouth every 6 (six) hours as needed for allergies or sleep.   empagliflozin (JARDIANCE) 10 MG TABS tablet Take 1 tablet (10 mg total) by mouth daily before breakfast.   ferrous sulfate 325 (65 FE) MG tablet Take by mouth daily with breakfast. 65 mg daily   losartan (COZAAR) 100 MG tablet Take 1 tablet (100 mg total) by mouth daily.   metFORMIN (GLUCOPHAGE) 500 MG tablet Take 500 mg by mouth 2 (two) times daily with a meal.   metoprolol succinate (TOPROL XL) 50 MG 24 hr tablet Take 1 tablet in the morning and one-half tablet in the evening   Multiple Vitamins-Minerals (PRESERVISION AREDS 2) CAPS Take 1 capsule by mouth 2 (two) times daily.   rosuvastatin (CRESTOR) 20 MG tablet Take 1 tablet (20 mg total) by mouth daily.   sertraline (ZOLOFT) 50 MG tablet Take 50 mg by mouth in the morning.   spironolactone (ALDACTONE) 25 MG tablet Take 0.5 tablets (12.5 mg total) by mouth daily.   [DISCONTINUED] losartan (COZAAR) 25 MG tablet Take 2 tablets in the morning and 1 tablet in the evening.     Allergies:   Sacubitril-valsartan   Social History   Socioeconomic History   Marital status: Married    Spouse name: Not on file   Number of children: Not on file   Years of education: Not on file   Highest education level: Not on file  Occupational History   Not on file  Tobacco Use   Smoking status: Never   Smokeless tobacco: Never  Substance and Sexual Activity   Alcohol use: Not on file   Drug use: Not on file   Sexual activity: Not on file  Other Topics Concern   Not on file  Social History Narrative   Not on file   Social Determinants of Health   Financial Resource Strain: Not on file  Food Insecurity: Not on file  Transportation Needs: Not on file  Physical Activity: Not on file  Stress: Not on file  Social Connections: Not on file      Family History: The patient's family history includes Cancer in his father and mother. There is no history of CAD.  ROS:   Please see the history of present illness.    Positive for mild memory impairment, leg swelling, shortness of breath.  All other systems reviewed and are negative.  EKGs/Labs/Other Studies Reviewed:    The following studies were reviewed today: Echo 12-27-20: FINDINGS   Left Ventricle: Severe ischemic DCM post CABG global hypokinesis worse in  septum and apex. Left ventricular ejection fraction, by estimation, is 25  to 30%. The left ventricle has severely decreased function. The left  ventricle demonstrates global  hypokinesis. The left ventricular internal cavity size was normal in size.  There is no left ventricular hypertrophy. Left ventricular diastolic  parameters are indeterminate.   Right Ventricle: The right ventricular size is normal. No increase in  right ventricular wall thickness. Right  ventricular systolic function is  normal. There is mildly elevated pulmonary artery systolic pressure. The  tricuspid regurgitant velocity is 2.92   m/s, and with an assumed right atrial pressure of 3 mmHg, the estimated  right ventricular systolic pressure is 19.4 mmHg.   Left Atrium: Left atrial size was mildly dilated.   Right Atrium: Right atrial size was normal in size.   Pericardium: There is no evidence of pericardial effusion.   Mitral Valve: The mitral valve is degenerative in appearance. There is  moderate thickening of the mitral valve leaflet(s). There is moderate  calcification of the mitral valve leaflet(s). Moderate mitral annular  calcification. Trivial mitral valve  regurgitation. No evidence of mitral valve stenosis.   Tricuspid Valve: The tricuspid valve is normal in structure. Tricuspid  valve regurgitation is not demonstrated. No evidence of tricuspid  stenosis.   Aortic Valve: Morphologically patient has significant AS Limited  leaflet  motion severely calcified DVI 0.25 AVA 1 cm2 Suspect he would benefit from  TAVR Dr Burt Knack has reviewed case before At cath prior to CABG peak to peak  gradient only 10-15 mHg S  Suggest structural heart team review again. The aortic valve is calcified.  There is severe calcifcation of the aortic valve. There is severe  thickening of the aortic valve. Aortic valve regurgitation is not  visualized. Moderate to severe aortic stenosis  is present. Aortic valve mean gradient measures 11.0 mmHg. Aortic valve  peak gradient measures 19.2 mmHg. Aortic valve area, by VTI measures 1.04  cm.   Pulmonic Valve: The pulmonic valve was normal in structure. Pulmonic valve  regurgitation is not visualized. No evidence of pulmonic stenosis.   Aorta: The aortic root is normal in size and structure.   Venous: The inferior vena cava is normal in size with greater than 50%  respiratory variability, suggesting right atrial pressure of 3 mmHg.   IAS/Shunts: No atrial level shunt detected by color flow Doppler.      LEFT VENTRICLE  PLAX 2D  LVIDd:         5.30 cm  Diastology  LVIDs:         4.40 cm  LV e' medial:    3.26 cm/s  LV PW:         0.90 cm  LV E/e' medial:  25.9  LV IVS:        0.80 cm  LV e' lateral:   7.07 cm/s  LVOT diam:     2.30 cm  LV E/e' lateral: 11.9  LV SV:         48  LV SV Index:   24  LVOT Area:     4.15 cm                             3D Volume EF:                          3D EF:        56 %                          LV EDV:       164 ml                          LV ESV:       72 ml  LV SV:        93 ml   RIGHT VENTRICLE  RV Basal diam:  4.50 cm  RV Mid diam:    3.60 cm  RV S prime:     7.51 cm/s  TAPSE (M-mode): 1.5 cm  RVSP:           37.1 mmHg   LEFT ATRIUM             Index       RIGHT ATRIUM           Index  LA diam:        4.00 cm 1.97 cm/m  RA Pressure: 3.00 mmHg  LA Vol (A2C):   79.9 ml 39.26 ml/m RA Area:     17.70 cm  LA  Vol (A4C):   63.6 ml 31.25 ml/m RA Volume:   49.90 ml  24.52 ml/m  LA Biplane Vol: 72.0 ml 35.38 ml/m   AORTIC VALVE  AV Area (Vmax):    1.06 cm  AV Area (Vmean):   0.95 cm  AV Area (VTI):     1.04 cm  AV Vmax:           219.00 cm/s  AV Vmean:          156.000 cm/s  AV VTI:            0.464 m  AV Peak Grad:      19.2 mmHg  AV Mean Grad:      11.0 mmHg  LVOT Vmax:         56.10 cm/s  LVOT Vmean:        35.600 cm/s  LVOT VTI:          0.116 m  LVOT/AV VTI ratio: 0.25     AORTA  Ao Root diam: 3.20 cm  Ao Asc diam:  3.60 cm   MITRAL VALVE               TRICUSPID VALVE  MV Area (PHT):             TR Peak grad:   34.1 mmHg  MV Decel Time:             TR Vmax:        292.00 cm/s  MV E velocity: 84.40 cm/s  Estimated RAP:  3.00 mmHg  MV A velocity: 28.30 cm/s  RVSP:           37.1 mmHg  MV E/A ratio:  2.98                             SHUNTS                             Systemic VTI:  0.12 m                             Systemic Diam: 2.30 cm   Cardiac Cath 09/20/20: Conclusions: Severe three-vessel coronary artery disease, including chronic total occlusion of proximal LAD with left-to-left collaterals, 75% ostial/proximal ramus intermedius stenosis, and sequential 75% ostial and 90% distal RCA lesions.  LCx and OM3 have mild to moderate disease of up to 40%. Moderately elevated left ventricular filling pressure (LVEDP ~30 mmHg). Mild aortic stenosis (peak-to-peak gradient 10-15 mmHg.   Recommendations: Imaged reviewed with Dr. Burt Knack during the procedure.  We  will plan for cardiac surgery consultation for CABG given severe LAD and RCA disease with good distal targets in both vessels. Defer reinitiation of IV heparin, as the coronary artery disease appears chronic and hemoglobin has trended down significantly since admission. Aggressive secondary prevention. Diurese with furosemide 40 mg IV x 1.  Further diureses to be based on urine output, volume status, and renal  function.  Diagnostic Dominance: Right Left Main  Vessel is large.  Left Anterior Descending  Vessel is large.  Ost LAD to Prox LAD lesion is 75% stenosed.  Prox LAD lesion is 100% stenosed. The lesion is chronically occluded.  First Diagonal Branch  Vessel is large in size.  1st Diag lesion is 75% stenosed.  Ramus Intermedius  Vessel is moderate in size.  Ramus lesion is 75% stenosed.  Left Circumflex  Vessel is large.  Prox Cx lesion is 40% stenosed.  First Obtuse Marginal Branch  Vessel is small in size.  Second Obtuse Marginal Branch  Vessel is large in size.  Third Obtuse Marginal Branch  Vessel is large in size.  3rd Mrg lesion is 30% stenosed.  Right Coronary Artery  Ost RCA lesion is 75% stenosed. The lesion is calcified.  Dist RCA lesion is 90% stenosed.  Right Posterior Descending Artery  Vessel is moderate in size.  Right Posterior Atrioventricular Artery  Vessel is moderate in size.  Intervention  No interventions have been documented. Left Heart  Left Ventricle LV end diastolic pressure is moderately elevated. LVEDP ~30 mmHg.  Aortic Valve There is mild aortic valve stenosis.   Coronary Diagrams   Diagnostic Dominance: Right     EKG:  EKG is not ordered today.    Recent Labs: 11/08/2020: Magnesium 1.7 01/10/2021: ALT 14; TSH 2.740 01/28/2021: BUN 13; Creatinine, Ser 0.95; Hemoglobin 12.7; Platelets 209; Potassium 4.0; Sodium 135  Recent Lipid Panel    Component Value Date/Time   CHOL 178 01/10/2021 1013   TRIG 66 01/10/2021 1013   HDL 44 01/10/2021 1013   CHOLHDL 4.0 01/10/2021 1013   CHOLHDL 3.5 09/18/2020 0023   VLDL 19 09/18/2020 0023   LDLCALC 121 (H) 01/10/2021 1013     Risk Assessment/Calculations:                Physical Exam:    VS:  BP (!) 140/100 Comment: right arm BP 140/100  Pulse (!) 59   Ht 5\' 7"  (1.702 m)   Wt 196 lb 12.8 oz (89.3 kg)   SpO2 95%   BMI 30.82 kg/m     Wt Readings from Last 3 Encounters:   01/28/21 196 lb 12.8 oz (89.3 kg)  01/18/21 196 lb (88.9 kg)  12/29/20 196 lb 12.8 oz (89.3 kg)     GEN:  Well nourished, well developed in no acute distress HEENT: Normal NECK: No JVD; No carotid bruits LYMPHATICS: No lymphadenopathy CARDIAC: RRR, 2/6 systolic murmur at the RUSB RESPIRATORY:  Clear to auscultation without rales, wheezing or rhonchi  ABDOMEN: Soft, non-tender, non-distended MUSCULOSKELETAL:  No edema; No deformity  SKIN: Warm and dry NEUROLOGIC:  Alert and oriented x 3 PSYCHIATRIC:  Normal affect   ASSESSMENT:    1. CAD in native artery   2. Aortic valve stenosis, etiology of cardiac valve disease unspecified   3. Essential hypertension   4. Chronic combined systolic and diastolic heart failure (HCC)    PLAN:    In order of problems listed above:  Stable without symptoms of angina, recent CABG.  Treated with  aspirin for antiplatelet therapy, losartan and metoprolol succinate.  We will add a high intensity statin drug with rosuvastatin 20 mg daily. The patient's echo studies are reviewed.  We had extensive discussion about his case while he was hospitalized with specific discussion about whether or not to proceed with aortic valve replacement at the time of CABG.  The decision was made to defer aortic valve replacement because of concern about the patient's anoxic encephalopathy and risk for neurologic complications with a longer cardiopulmonary bypass run and more extensive surgery.  In addition, the patient's Doppler and hemodynamic assessment suggested no more than mild to moderate aortic stenosis at the time.  However, the patient now has more severe LV dysfunction with LVEF less than 30%.  His echo parameters are concerning for severe low-flow low gradient aortic stenosis.  The aortic valve leaflets are severely calcified and restricted.  The dimensionless index is 0.25 and calculated aortic valve area is 1 cm.  Peak and mean transvalvular gradients are only 19  and 11 mmHg, respectively.  Stroke-volume index is low at 24.  We had extensive discussion about the natural history of aortic stenosis today.  The patient and his wife understand that it can be more difficult to assess and accurately characterize the degree of aortic stenosis in the setting of severe LV dysfunction.  It is concerning that he has progressive now New York Heart Association functional class III symptoms of exertional dyspnea consistent with chronic systolic heart failure.  Further evaluation is indicated.  I have recommended right and left heart cath with graft angiography to assess for both graft patency and reassess the patient's aortic stenosis.  Patient will also undergo a CT angiogram of the heart and the chest, abdomen, and pelvis to assess for anatomic suitability for TAVR.  Once his studies are completed he will be referred to Dr. Cyndia Bent as part of a multidisciplinary approach to his care. Blood pressure remains uncontrolled.  He will increase losartan to 100 mg daily. Reviewed Dr. Evette Georges notes.  We will add Jardiance.    Cardiac Rehabilitation Eligibility Assessment       Shared Decision Making/Informed Consent The risks [stroke (1 in 1000), death (1 in 30), kidney failure [usually temporary] (1 in 500), bleeding (1 in 200), allergic reaction [possibly serious] (1 in 200)], benefits (diagnostic support and management of coronary artery disease) and alternatives of a cardiac catheterization were discussed in detail with Brian Robinson and he is willing to proceed.    Medication Adjustments/Labs and Tests Ordered: Current medicines are reviewed at length with the patient today.  Concerns regarding medicines are outlined above.  Orders Placed This Encounter  Procedures   Lipid panel   Hepatic function panel   Basic metabolic panel   CBC   Meds ordered this encounter  Medications   rosuvastatin (CRESTOR) 20 MG tablet    Sig: Take 1 tablet (20 mg total) by mouth daily.     Dispense:  90 tablet    Refill:  3   losartan (COZAAR) 100 MG tablet    Sig: Take 1 tablet (100 mg total) by mouth daily.    Dispense:  90 tablet    Refill:  3    Dose change   empagliflozin (JARDIANCE) 10 MG TABS tablet    Sig: Take 1 tablet (10 mg total) by mouth daily before breakfast.    Dispense:  30 tablet    Refill:  11    Patient Instructions  Medication Instructions:  1) START  Rosuvastatin 20mg  once daily 2) INCREASE Losartan to 100mg  once daily 3) START Jardiance 10mg  once daily  *If you need a refill on your cardiac medications before your next appointment, please call your pharmacy*   Lab Work: BMET and CBC today  Lipid and Liver in 8 weeks.  You will need to be fasting for these labs (nothing to eat or drink after midnight except water and black coffee).  If you have labs (blood work) drawn today and your tests are completely normal, you will receive your results only by: [place] (if you have MyChart) OR A paper copy in the mail If you have any lab test that is abnormal or we need to change your treatment, we will call you to review the results.   Testing/Procedures: Your physician has requested that you have a cardiac catheterization. Cardiac catheterization is used to diagnose and/or treat various heart conditions. Doctors may recommend this procedure for a number of different reasons. The most common reason is to evaluate chest pain. Chest pain can be a symptom of coronary artery disease (CAD), and cardiac catheterization can show whether plaque is narrowing or blocking your heart's arteries. This procedure is also used to evaluate the valves, as well as measure the blood flow and oxygen levels in different parts of your heart. For further information please visit [email or web address]. Please follow instruction sheet, as given.   Follow-Up:  The structural team will be in contact with you after your heart catheterization.   Other Instructions   [facility] OFFICE [place], [place]  [place] [address] Dept: [phone] Loc: [phone]  Brian Robinson  01/28/2021  You are scheduled for a Cardiac Catheterization on Monday, November 11 with Dr. Sherren Mocha.  1. Please arrive at the [facility] (Main Entrance A) at [facility]: [address] [place], [place] [address] at 5:30 AM (This time is two hours before your procedure to ensure your preparation). Free valet parking service is available.   Special note: Every effort is made to have your procedure done on time. Please understand that emergencies sometimes delay scheduled procedures.  2. Diet: Do not eat solid foods after midnight.  The patient may have clear liquids until 5am upon the day of the procedure.  3. Labs: You will have labs drawn today  4. Medication instructions in preparation for your procedure:   Contrast Allergy: No  Do not take Diabetes Med Glucophage (Metformin) on the day of the procedure and HOLD 48 HOURS AFTER THE PROCEDURE.    Do not take your Spironolactone or Jardiance the morning of the procedure.  On the morning of your procedure, take your Aspirin and any morning medicines NOT listed above.  You may use sips of water.  5. Plan for one night stay--bring personal belongings. 6. Bring a current list of your medications and current insurance cards. 7. You MUST have a responsible person to drive you home. 8. Someone MUST be with you the first 24 hours after you arrive home or your discharge will be delayed. 9. Please wear clothes that are easy to get on and off and wear slip-on shoes.  Thank you for allowing [place] to care for you!   -- [facility] Invasive Cardiovascular services     Signed, Sherren Mocha, MD  01/30/2021 3:11 PM    [place]

## 2021-01-28 NOTE — Patient Instructions (Signed)
Medication Instructions:  1) START Rosuvastatin 20mg  once daily 2) INCREASE Losartan to 100mg  once daily 3) START Jardiance 10mg  once daily  *If you need a refill on your cardiac medications before your next appointment, please call your pharmacy*   Lab Work: BMET and CBC today  Lipid and Liver in 8 weeks.  You will need to be fasting for these labs (nothing to eat or drink after midnight except water and black coffee).  If you have labs (blood work) drawn today and your tests are completely normal, you will receive your results only by: [place] (if you have MyChart) OR A paper copy in the mail If you have any lab test that is abnormal or we need to change your treatment, we will call you to review the results.   Testing/Procedures: Your physician has requested that you have a cardiac catheterization. Cardiac catheterization is used to diagnose and/or treat various heart conditions. Doctors may recommend this procedure for a number of different reasons. The most common reason is to evaluate chest pain. Chest pain can be a symptom of coronary artery disease (CAD), and cardiac catheterization can show whether plaque is narrowing or blocking your heart's arteries. This procedure is also used to evaluate the valves, as well as measure the blood flow and oxygen levels in different parts of your heart. For further information please visit [email or web address]. Please follow instruction sheet, as given.   Follow-Up:  The structural team will be in contact with you after your heart catheterization.   Other Instructions   [facility] OFF[facility] [place], [place] [address] Dept: [phone] Loc: [phone]  Sabir Charters  01/28/2021  You are scheduled for a Cardiac Catheterization on Monday, November 11 with Dr. Sherren Mocha.  1. Please arrive at the [facility] (Main Entrance A)  at [facility]: [address] [place], [place] [address] at 5:30 AM (This time is two hours before your procedure to ensure your preparation). Free valet parking service is available.   Special note: Every effort is made to have your procedure done on time. Please understand that emergencies sometimes delay scheduled procedures.  2. Diet: Do not eat solid foods after midnight.  The patient may have clear liquids until 5am upon the day of the procedure.  3. Labs: You will have labs drawn today  4. Medication instructions in preparation for your procedure:   Contrast Allergy: No  Do not take Diabetes Med Glucophage (Metformin) on the day of the procedure and HOLD 48 HOURS AFTER THE PROCEDURE.    Do not take your Spironolactone or Jardiance the morning of the procedure.  On the morning of your procedure, take your Aspirin and any morning medicines NOT listed above.  You may use sips of water.  5. Plan for one night stay--bring personal belongings. 6. Bring a current list of your medications and current insurance cards. 7. You MUST have a responsible person to drive you home. 8. Someone MUST be with you the first 24 hours after you arrive home or your discharge will be delayed. 9. Please wear clothes that are easy to get on and off and wear slip-on shoes.  Thank you for allowing [place] to care for you!   -- [facility]

## 2021-01-30 ENCOUNTER — Encounter: Payer: Self-pay | Admitting: Cardiovascular Disease

## 2021-02-03 ENCOUNTER — Telehealth: Payer: Self-pay | Admitting: *Deleted

## 2021-02-03 NOTE — Telephone Encounter (Signed)
Cardiac catheterization scheduled at [facility] for: Monday February 07, 2021 7:30 [facility] Main Entrance A [facility]) at: 5:30 AM   No solid food after midnight prior to cath, clear liquids until 5 AM day of procedure.  Medication instructions: Hold: Metformin-day of procedure and 48 hours post procedure Jardiance-AM of procedure Spironolactone-AM of procedure  Except hold medications usual morning medications can be taken pre-cath with sips of water including aspirin 81 mg.    Confirmed patient has responsible adult to drive home post procedure and be with patient first 24 hours after arriving home.  [facility] does allow one visitor to accompany you and wait in the hospital waiting room while you are there for your procedure. You and your visitor will be asked to wear a mask once you enter the hospital.   Patient reports does not currently have any new symptoms concerning for COVID-19 and no household members with COVID-19 like illness.      Reviewed procedure/mask/visitor instructions with patient.

## 2021-02-04 NOTE — Addendum Note (Signed)
Addended by: Lanna Poche R on: 02/04/2021 10:26 AM   Modules accepted: Orders

## 2021-02-07 ENCOUNTER — Ambulatory Visit (HOSPITAL_COMMUNITY)
Admission: RE | Admit: 2021-02-07 | Discharge: 2021-02-07 | Disposition: A | Discharge status: Home / Self Care | Payer: Medicare Other | Attending: Cardiovascular Disease | Admitting: Cardiovascular Disease

## 2021-02-07 ENCOUNTER — Encounter (HOSPITAL_COMMUNITY)
Admission: RE | Disposition: A | Discharge status: Home / Self Care | Payer: Self-pay | Source: Home / Self Care | Attending: Cardiovascular Disease

## 2021-02-07 ENCOUNTER — Other Ambulatory Visit: Payer: Self-pay

## 2021-02-07 ENCOUNTER — Encounter (HOSPITAL_COMMUNITY): Payer: Self-pay | Admitting: Cardiovascular Disease

## 2021-02-07 DIAGNOSIS — I5023 Acute on chronic systolic (congestive) heart failure: Secondary | ICD-10-CM

## 2021-02-07 DIAGNOSIS — I5022 Chronic systolic (congestive) heart failure: Secondary | ICD-10-CM

## 2021-02-07 DIAGNOSIS — I110 Hypertensive heart disease with heart failure: Secondary | ICD-10-CM | POA: Diagnosis not present

## 2021-02-07 DIAGNOSIS — I2582 Chronic total occlusion of coronary artery: Secondary | ICD-10-CM | POA: Diagnosis not present

## 2021-02-07 DIAGNOSIS — Z7982 Long term (current) use of aspirin: Secondary | ICD-10-CM | POA: Insufficient documentation

## 2021-02-07 DIAGNOSIS — I272 Pulmonary hypertension, unspecified: Secondary | ICD-10-CM | POA: Diagnosis not present

## 2021-02-07 DIAGNOSIS — Z79899 Other long term (current) drug therapy: Secondary | ICD-10-CM | POA: Diagnosis not present

## 2021-02-07 DIAGNOSIS — Z951 Presence of aortocoronary bypass graft: Secondary | ICD-10-CM | POA: Insufficient documentation

## 2021-02-07 DIAGNOSIS — I5042 Chronic combined systolic (congestive) and diastolic (congestive) heart failure: Secondary | ICD-10-CM | POA: Insufficient documentation

## 2021-02-07 DIAGNOSIS — I2510 Atherosclerotic heart disease of native coronary artery without angina pectoris: Secondary | ICD-10-CM | POA: Diagnosis not present

## 2021-02-07 DIAGNOSIS — I350 Nonrheumatic aortic (valve) stenosis: Secondary | ICD-10-CM | POA: Diagnosis not present

## 2021-02-07 HISTORY — PX: RIGHT/LEFT HEART CATH AND CORONARY/GRAFT ANGIOGRAPHY: CATH118267

## 2021-02-07 LAB — GLUCOSE, CAPILLARY
Glucose-Capillary: 109 mg/dL — ABNORMAL HIGH (ref 70–99)
Glucose-Capillary: 87 mg/dL (ref 70–99)

## 2021-02-07 LAB — POCT I-STAT 7, (LYTES, BLD GAS, ICA,H+H)
Acid-Base Excess: 2 mmol/L (ref 0.0–2.0)
Bicarbonate: 29.2 mmol/L — ABNORMAL HIGH (ref 20.0–28.0)
Calcium, Ion: 1.16 mmol/L (ref 1.15–1.40)
HCT: 36 % — ABNORMAL LOW (ref 39.0–52.0)
Hemoglobin: 12.2 g/dL — ABNORMAL LOW (ref 13.0–17.0)
O2 Saturation: 61 %
Potassium: 3.7 mmol/L (ref 3.5–5.1)
Sodium: 138 mmol/L (ref 135–145)
TCO2: 31 mmol/L (ref 22–32)
pCO2 arterial: 56.7 mmHg — ABNORMAL HIGH (ref 32.0–48.0)
pH, Arterial: 7.32 — ABNORMAL LOW (ref 7.350–7.450)
pO2, Arterial: 35 mmHg — CL (ref 83.0–108.0)

## 2021-02-07 SURGERY — RIGHT/LEFT HEART CATH AND CORONARY/GRAFT ANGIOGRAPHY
Anesthesia: LOCAL

## 2021-02-07 MED ORDER — FUROSEMIDE 10 MG/ML IJ SOLN
INTRAMUSCULAR | Status: DC | PRN
  Administered 2021-02-07: 40 mg via INTRAVENOUS

## 2021-02-07 MED ORDER — LABETALOL HCL 5 MG/ML IV SOLN: 10.0000 mg | INTRAVENOUS | Status: DC | PRN

## 2021-02-07 MED ORDER — HEPARIN SODIUM (PORCINE) 1000 UNIT/ML IJ SOLN
INTRAMUSCULAR | Status: AC
  Filled 2021-02-07: qty 1

## 2021-02-07 MED ORDER — FUROSEMIDE 40 MG PO TABS: 40.0000 mg | ORAL_TABLET | Freq: Every day | ORAL | 11 refills | Status: DC

## 2021-02-07 MED ORDER — SODIUM CHLORIDE 0.9 % IV SOLN: 250.0000 mL | INTRAVENOUS | Status: DC | PRN

## 2021-02-07 MED ORDER — LIDOCAINE HCL (PF) 1 % IJ SOLN
INTRAMUSCULAR | Status: DC | PRN
  Administered 2021-02-07 (×2): 2 mL

## 2021-02-07 MED ORDER — FUROSEMIDE 10 MG/ML IJ SOLN
INTRAMUSCULAR | Status: AC
  Filled 2021-02-07: qty 4

## 2021-02-07 MED ORDER — HEPARIN SODIUM (PORCINE) 1000 UNIT/ML IJ SOLN
INTRAMUSCULAR | Status: DC | PRN
  Administered 2021-02-07: 5000 [IU] via INTRAVENOUS

## 2021-02-07 MED ORDER — ACETAMINOPHEN 325 MG PO TABS: 650.0000 mg | ORAL_TABLET | ORAL | Status: DC | PRN

## 2021-02-07 MED ORDER — HYDRALAZINE HCL 20 MG/ML IJ SOLN: 10.0000 mg | INTRAMUSCULAR | Status: DC | PRN

## 2021-02-07 MED ORDER — HEPARIN (PORCINE) IN NACL 1000-0.9 UT/500ML-% IV SOLN
INTRAVENOUS | Status: DC | PRN
  Administered 2021-02-07 (×2): 500 mL

## 2021-02-07 MED ORDER — SODIUM CHLORIDE 0.9% FLUSH: 3.0000 mL | INTRAVENOUS | Status: DC | PRN

## 2021-02-07 MED ORDER — MIDAZOLAM HCL 2 MG/2ML IJ SOLN
INTRAMUSCULAR | Status: DC | PRN
  Administered 2021-02-07: 2 mg via INTRAVENOUS

## 2021-02-07 MED ORDER — MIDAZOLAM HCL 2 MG/2ML IJ SOLN
INTRAMUSCULAR | Status: AC
  Filled 2021-02-07: qty 2

## 2021-02-07 MED ORDER — FENTANYL CITRATE (PF) 100 MCG/2ML IJ SOLN
INTRAMUSCULAR | Status: AC
  Filled 2021-02-07: qty 2

## 2021-02-07 MED ORDER — SODIUM CHLORIDE 0.9 % IV SOLN: INTRAVENOUS | Status: DC

## 2021-02-07 MED ORDER — LIDOCAINE HCL (PF) 1 % IJ SOLN
INTRAMUSCULAR | Status: AC
  Filled 2021-02-07: qty 30

## 2021-02-07 MED ORDER — FENTANYL CITRATE (PF) 100 MCG/2ML IJ SOLN
INTRAMUSCULAR | Status: DC | PRN
  Administered 2021-02-07: 25 ug via INTRAVENOUS

## 2021-02-07 MED ORDER — SODIUM CHLORIDE 0.9% FLUSH: 3.0000 mL | Freq: Two times a day (BID) | INTRAVENOUS | Status: DC

## 2021-02-07 MED ORDER — VERAPAMIL HCL 2.5 MG/ML IV SOLN
INTRAVENOUS | Status: DC | PRN
  Administered 2021-02-07: 10 mL via INTRA_ARTERIAL

## 2021-02-07 MED ORDER — ASPIRIN 81 MG PO CHEW: 81.0000 mg | CHEWABLE_TABLET | ORAL | Status: DC

## 2021-02-07 MED ORDER — VERAPAMIL HCL 2.5 MG/ML IV SOLN
INTRAVENOUS | Status: AC
  Filled 2021-02-07: qty 2

## 2021-02-07 MED ORDER — HEPARIN (PORCINE) IN NACL 1000-0.9 UT/500ML-% IV SOLN
INTRAVENOUS | Status: AC
  Filled 2021-02-07: qty 1000

## 2021-02-07 MED ORDER — IOHEXOL 350 MG/ML SOLN
INTRAVENOUS | Status: DC | PRN
  Administered 2021-02-07: 80 mL

## 2021-02-07 MED ORDER — ONDANSETRON HCL 4 MG/2ML IJ SOLN: 4.0000 mg | Freq: Four times a day (QID) | INTRAMUSCULAR | Status: DC | PRN

## 2021-02-07 SURGICAL SUPPLY — 14 items
CATH BALLN WEDGE 5F 110CM (CATHETERS) ×1 IMPLANT
CATH INFINITI 5FR AL1 (CATHETERS) ×1 IMPLANT
CATH INFINITI 5FR MULTPACK ANG (CATHETERS) ×1 IMPLANT
DEVICE RAD COMP TR BAND LRG (VASCULAR PRODUCTS) ×1 IMPLANT
GLIDESHEATH SLEND SS 6F .021 (SHEATH) ×1 IMPLANT
GUIDEWIRE INQWIRE 1.5J.035X260 (WIRE) IMPLANT
INQWIRE 1.5J .035X260CM (WIRE) ×2
KIT HEART LEFT (KITS) ×2 IMPLANT
PACK CARDIAC CATHETERIZATION (CUSTOM PROCEDURE TRAY) ×2 IMPLANT
SHEATH GLIDE SLENDER 4/5FR (SHEATH) ×1 IMPLANT
SHEATH PINNACLE 5F 10CM (SHEATH) IMPLANT
TRANSDUCER W/STOPCOCK (MISCELLANEOUS) ×2 IMPLANT
TUBING CIL FLEX 10 FLL-RA (TUBING) ×2 IMPLANT
WIRE EMERALD ST .035X150CM (WIRE) ×1 IMPLANT

## 2021-02-07 NOTE — Interval H&P Note (Signed)
History and Physical Interval Note:  02/07/2021 7:42 AM  Brian Robinson  has presented today for surgery, with the diagnosis of aortic stenosis.  The various methods of treatment have been discussed with the patient and family. After consideration of risks, benefits and other options for treatment, the patient has consented to  Procedure(s): RIGHT/LEFT HEART CATH AND CORONARY/GRAFT ANGIOGRAPHY (N/A) as a surgical intervention.  The patient's history has been reviewed, patient examined, no change in status, stable for surgery.  I have reviewed the patient's chart and labs.  Questions were answered to the patient's satisfaction.     Sherren Mocha

## 2021-02-14 ENCOUNTER — Other Ambulatory Visit: Payer: Self-pay

## 2021-02-14 ENCOUNTER — Encounter: Payer: Self-pay | Admitting: Internal Medicine

## 2021-02-14 ENCOUNTER — Ambulatory Visit (INDEPENDENT_AMBULATORY_CARE_PROVIDER_SITE_OTHER): Payer: Medicare Other | Admitting: Internal Medicine

## 2021-02-14 VITALS — BP 130/72 | HR 71 | Ht 68.0 in | Wt 190.0 lb

## 2021-02-14 DIAGNOSIS — I469 Cardiac arrest, cause unspecified: Secondary | ICD-10-CM

## 2021-02-14 DIAGNOSIS — I5022 Chronic systolic (congestive) heart failure: Secondary | ICD-10-CM

## 2021-02-14 NOTE — Patient Instructions (Addendum)
Medication Instructions:  Your physician recommends that you continue on your current medications as directed. Please refer to the Current Medication list given to you today.  Labwork: None ordered.  Testing/Procedures: None ordered.  Follow-Up:  SEE INSTRUCTION LETTER  Any Other Special Instructions Will Be Listed Below (If Applicable).  If you need a refill on your cardiac medications before your next appointment, please call your pharmacy.    

## 2021-02-14 NOTE — Progress Notes (Signed)
HPI Brian Robinson is referred by Dr. Burt Knack for consideration of ICD insertion. He is a pleasant 75 yo man with an extensive cardiac history. 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 He has persistent LV dysfunction and repeat cath confirmed that his grafts were patent. He is on GDMT. 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 The patient has not had syncope. He has mild peripheral edema. He denies anginal symptoms.  Allergies  Allergen Reactions   Entresto [Sacubitril-Valsartan] Rash     Current Outpatient Medications  Medication Sig Dispense Refill   acetaminophen (TYLENOL) 500 MG tablet Take 2 tablets (1,000 mg total) by mouth every 6 (six) hours as needed. 30 tablet 0   ALPRAZolam (XANAX) 0.25 MG tablet Take 0.25 mg by mouth daily as needed for anxiety.     aspirin 81 MG EC tablet Take 1 tablet (81 mg total) by mouth daily.     cetirizine (ZYRTEC) 10 MG tablet Take 10 mg by mouth daily.     diphenhydrAMINE (BENADRYL) 25 mg capsule Take 25 mg by mouth every 6 (six) hours as needed for allergies or sleep.     empagliflozin (JARDIANCE) 10 MG TABS tablet Take 1 tablet (10 mg total) by mouth daily before breakfast. 30 tablet 11   ferrous sulfate 325 (65 FE) MG tablet Take by mouth daily with breakfast. 65 mg daily     furosemide (LASIX) 40 MG tablet Take 1 tablet (40 mg total) by mouth daily. 30 tablet 11   losartan (COZAAR) 100 MG tablet Take 1 tablet (100 mg total) by mouth daily. 90 tablet 3   metFORMIN (GLUCOPHAGE) 500 MG tablet Take 500 mg by mouth 2 (two) times daily with a meal.     metoprolol succinate (TOPROL XL) 50 MG 24 hr tablet Take 1 tablet in the morning and one-half tablet in the evening 90 tablet 3   Multiple Vitamin (MULTI VITAMIN DAILY PO) Take 1 tablet by mouth daily.     Multiple Vitamins-Minerals (PRESERVISION AREDS 2) CAPS Take 1 capsule by mouth 2 (two) times daily.     rosuvastatin (CRESTOR) 20 MG tablet Take 1 tablet (20 mg total) by  mouth daily. 90 tablet 3   sertraline (ZOLOFT) 50 MG tablet Take 50 mg by mouth in the morning.     spironolactone (ALDACTONE) 25 MG tablet Take 0.5 tablets (12.5 mg total) by mouth daily. 45 tablet 3   No current facility-administered medications for this visit.     Past Medical History:  Diagnosis Date   COVID-19    received Mab 01/2020   Diabetes mellitus without complication (HCC)    Heart murmur    Hypertension    Mild aortic stenosis     ROS:   All systems reviewed and negative except as noted in the HPI.   Past Surgical History:  Procedure Laterality Date   CORONARY ARTERY BYPASS GRAFT N/A 09/23/2020   Procedure: CORONARY ARTERY BYPASS GRAFTING (CABG), ON PUMP, TIMES THREE, USING LEFT INTERNAL MAMMARY ARTERY AND LEFT ENDOSCOPICALLY HARVESTED GREATER SAPHENOUS VEIN;  Surgeon: [name], MD;  Location: [facility];  Service: Open Heart Surgery;  Laterality: N/A;   ENDOVEIN HARVEST OF GREATER SAPHENOUS VEIN Left 09/23/2020   Procedure: ENDOVEIN HARVEST OF GREATER SAPHENOUS VEIN;  Surgeon: [name], MD;  Location: [facility];  Service: Open Heart Surgery;  Laterality: Left;   LEFT HEART CATH AND CORONARY ANGIOGRAPHY N/A 09/20/2020   Procedure: LEFT  HEART CATH AND CORONARY ANGIOGRAPHY;  Surgeon: Nelva Bush, MD;  Location: [facility];  Service: Cardiovascular;  Laterality: N/A;   RIGHT/LEFT HEART CATH AND CORONARY/GRAFT ANGIOGRAPHY N/A 02/07/2021   Procedure: RIGHT/LEFT HEART CATH AND CORONARY/GRAFT ANGIOGRAPHY;  Surgeon: Sherren Mocha, MD;  Location: [facility];  Service: Cardiovascular;  Laterality: N/A;   TEE WITHOUT CARDIOVERSION N/A 09/23/2020   Procedure: TRANSESOPHAGEAL ECHOCARDIOGRAM (TEE);  Surgeon: [name], MD;  Location: [facility];  Service: Open Heart Surgery;  Laterality: N/A;     Family History  Problem Relation Age of Onset   Cancer Mother    Cancer Father    CAD Neg Hx      Social History   Socioeconomic  History   Marital status: Married    Spouse name: Not on file   Number of children: Not on file   Years of education: Not on file   Highest education level: Not on file  Occupational History   Not on file  Tobacco Use   Smoking status: Never   Smokeless tobacco: Never  Substance and Sexual Activity   Alcohol use: Not on file   Drug use: Not on file   Sexual activity: Not on file  Other Topics Concern   Not on file  Social History Narrative   Not on file   Social Determinants of Health   Financial Resource Strain: Not on file  Food Insecurity: Not on file  Transportation Needs: Not on file  Physical Activity: Not on file  Stress: Not on file  Social Connections: Not on file  Intimate Partner Violence: Not on file     BP 130/72   Pulse 71   Ht 5\' 8"  (1.727 m)   Wt 190 lb (86.2 kg)   SpO2 97%   BMI 28.89 kg/m   Physical Exam:  Well appearing NAD HEENT: Unremarkable Neck:  No JVD, no thyromegally Lymphatics:  No adenopathy Back:  No CVA tenderness Lungs:  Clear - with no wheezes HEART:  Regular rate rhythm, no murmurs, no rubs, no clicks Abd:  soft, positive bowel sounds, no organomegally, no rebound, no guarding Ext:  2 plus pulses, no edema, no cyanosis, no clubbing Skin:  No rashes no nodules Neuro:  CN II through XII intact, motor grossly intact  Assess/Plan:  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 He has not had a recurrence. ICM - he is s/p revascularization with patent grafts but unfortunately has persistent LV dysfunction. I discussed the indications/risks/benefits/goals/expectations of ICD insertion and he would like to proceed. HTN - his bp is better today. We will not change any meds.  Carleene Overlie Brenly Trawick,MD

## 2021-02-14 NOTE — H&P (View-Only) (Signed)
HPI Brian Robinson is referred by Dr. Burt Knack for consideration of ICD insertion. He is a pleasant 75 yo man with an extensive cardiac history. 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 He has persistent LV dysfunction and repeat cath confirmed that his grafts were patent. He is on GDMT. 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 The patient has not had syncope. He has mild peripheral edema. He denies anginal symptoms.  Allergies  Allergen Reactions   Entresto [Sacubitril-Valsartan] Rash     Current Outpatient Medications  Medication Sig Dispense Refill   acetaminophen (TYLENOL) 500 MG tablet Take 2 tablets (1,000 mg total) by mouth every 6 (six) hours as needed. 30 tablet 0   ALPRAZolam (XANAX) 0.25 MG tablet Take 0.25 mg by mouth daily as needed for anxiety.     aspirin 81 MG EC tablet Take 1 tablet (81 mg total) by mouth daily.     cetirizine (ZYRTEC) 10 MG tablet Take 10 mg by mouth daily.     diphenhydrAMINE (BENADRYL) 25 mg capsule Take 25 mg by mouth every 6 (six) hours as needed for allergies or sleep.     empagliflozin (JARDIANCE) 10 MG TABS tablet Take 1 tablet (10 mg total) by mouth daily before breakfast. 30 tablet 11   ferrous sulfate 325 (65 FE) MG tablet Take by mouth daily with breakfast. 65 mg daily     furosemide (LASIX) 40 MG tablet Take 1 tablet (40 mg total) by mouth daily. 30 tablet 11   losartan (COZAAR) 100 MG tablet Take 1 tablet (100 mg total) by mouth daily. 90 tablet 3   metFORMIN (GLUCOPHAGE) 500 MG tablet Take 500 mg by mouth 2 (two) times daily with a meal.     metoprolol succinate (TOPROL XL) 50 MG 24 hr tablet Take 1 tablet in the morning and one-half tablet in the evening 90 tablet 3   Multiple Vitamin (MULTI VITAMIN DAILY PO) Take 1 tablet by mouth daily.     Multiple Vitamins-Minerals (PRESERVISION AREDS 2) CAPS Take 1 capsule by mouth 2 (two) times daily.     rosuvastatin (CRESTOR) 20 MG tablet Take 1 tablet (20 mg total) by  mouth daily. 90 tablet 3   sertraline (ZOLOFT) 50 MG tablet Take 50 mg by mouth in the morning.     spironolactone (ALDACTONE) 25 MG tablet Take 0.5 tablets (12.5 mg total) by mouth daily. 45 tablet 3   No current facility-administered medications for this visit.     Past Medical History:  Diagnosis Date   COVID-19    received Mab 01/2020   Diabetes mellitus without complication (HCC)    Heart murmur    Hypertension    Mild aortic stenosis     ROS:   All systems reviewed and negative except as noted in the HPI.   Past Surgical History:  Procedure Laterality Date   CORONARY ARTERY BYPASS GRAFT N/A 09/23/2020   Procedure: CORONARY ARTERY BYPASS GRAFTING (CABG), ON PUMP, TIMES THREE, USING LEFT INTERNAL MAMMARY ARTERY AND LEFT ENDOSCOPICALLY HARVESTED GREATER SAPHENOUS VEIN;  Surgeon: [name], MD;  Location: [facility];  Service: Open Heart Surgery;  Laterality: N/A;   ENDOVEIN HARVEST OF GREATER SAPHENOUS VEIN Left 09/23/2020   Procedure: ENDOVEIN HARVEST OF GREATER SAPHENOUS VEIN;  Surgeon: [name], MD;  Location: [facility];  Service: Open Heart Surgery;  Laterality: Left;   LEFT HEART CATH AND CORONARY ANGIOGRAPHY N/A 09/20/2020   Procedure: LEFT  HEART CATH AND CORONARY ANGIOGRAPHY;  Surgeon: Nelva Bush, MD;  Location: [facility];  Service: Cardiovascular;  Laterality: N/A;   RIGHT/LEFT HEART CATH AND CORONARY/GRAFT ANGIOGRAPHY N/A 02/07/2021   Procedure: RIGHT/LEFT HEART CATH AND CORONARY/GRAFT ANGIOGRAPHY;  Surgeon: Sherren Mocha, MD;  Location: [facility];  Service: Cardiovascular;  Laterality: N/A;   TEE WITHOUT CARDIOVERSION N/A 09/23/2020   Procedure: TRANSESOPHAGEAL ECHOCARDIOGRAM (TEE);  Surgeon: [name], MD;  Location: [facility];  Service: Open Heart Surgery;  Laterality: N/A;     Family History  Problem Relation Age of Onset   Cancer Mother    Cancer Father    CAD Neg Hx      Social History   Socioeconomic  History   Marital status: Married    Spouse name: Not on file   Number of children: Not on file   Years of education: Not on file   Highest education level: Not on file  Occupational History   Not on file  Tobacco Use   Smoking status: Never   Smokeless tobacco: Never  Substance and Sexual Activity   Alcohol use: Not on file   Drug use: Not on file   Sexual activity: Not on file  Other Topics Concern   Not on file  Social History Narrative   Not on file   Social Determinants of Health   Financial Resource Strain: Not on file  Food Insecurity: Not on file  Transportation Needs: Not on file  Physical Activity: Not on file  Stress: Not on file  Social Connections: Not on file  Intimate Partner Violence: Not on file     BP 130/72   Pulse 71   Ht 5\' 8"  (1.727 m)   Wt 190 lb (86.2 kg)   SpO2 97%   BMI 28.89 kg/m   Physical Exam:  Well appearing NAD HEENT: Unremarkable Neck:  No JVD, no thyromegally Lymphatics:  No adenopathy Back:  No CVA tenderness Lungs:  Clear - with no wheezes HEART:  Regular rate rhythm, no murmurs, no rubs, no clicks Abd:  soft, positive bowel sounds, no organomegally, no rebound, no guarding Ext:  2 plus pulses, no edema, no cyanosis, no clubbing Skin:  No rashes no nodules Neuro:  CN II through XII intact, motor grossly intact  Assess/Plan:  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 He has not had a recurrence. ICM - he is s/p revascularization with patent grafts but unfortunately has persistent LV dysfunction. I discussed the indications/risks/benefits/goals/expectations of ICD insertion and he would like to proceed. HTN - his bp is better today. We will not change any meds.  [name],MD

## 2021-02-18 NOTE — Pre-Procedure Instructions (Signed)
Attempted to call patient regarding procedure instructions.  No answer 

## 2021-02-21 ENCOUNTER — Ambulatory Visit (HOSPITAL_COMMUNITY): Payer: Medicare Other

## 2021-02-21 ENCOUNTER — Encounter (HOSPITAL_COMMUNITY)
Admission: RE | Disposition: A | Discharge status: Home / Self Care | Payer: Self-pay | Source: Home / Self Care | Attending: Internal Medicine

## 2021-02-21 ENCOUNTER — Ambulatory Visit (HOSPITAL_COMMUNITY)
Admission: RE | Admit: 2021-02-21 | Discharge: 2021-02-21 | Disposition: A | Discharge status: Home / Self Care | Payer: Medicare Other | Attending: Internal Medicine | Admitting: Internal Medicine

## 2021-02-21 ENCOUNTER — Other Ambulatory Visit: Payer: Self-pay

## 2021-02-21 DIAGNOSIS — Z95810 Presence of automatic (implantable) cardiac defibrillator: Secondary | ICD-10-CM

## 2021-02-21 DIAGNOSIS — I350 Nonrheumatic aortic (valve) stenosis: Secondary | ICD-10-CM | POA: Diagnosis not present

## 2021-02-21 DIAGNOSIS — I110 Hypertensive heart disease with heart failure: Secondary | ICD-10-CM | POA: Diagnosis not present

## 2021-02-21 DIAGNOSIS — I428 Other cardiomyopathies: Secondary | ICD-10-CM | POA: Insufficient documentation

## 2021-02-21 DIAGNOSIS — Z951 Presence of aortocoronary bypass graft: Secondary | ICD-10-CM | POA: Insufficient documentation

## 2021-02-21 DIAGNOSIS — I517 Cardiomegaly: Secondary | ICD-10-CM | POA: Diagnosis not present

## 2021-02-21 DIAGNOSIS — I5022 Chronic systolic (congestive) heart failure: Secondary | ICD-10-CM | POA: Diagnosis not present

## 2021-02-21 DIAGNOSIS — E119 Type 2 diabetes mellitus without complications: Secondary | ICD-10-CM | POA: Insufficient documentation

## 2021-02-21 HISTORY — PX: ICD IMPLANT: EP1208

## 2021-02-21 LAB — GLUCOSE, CAPILLARY: Glucose-Capillary: 77 mg/dL (ref 70–99)

## 2021-02-21 SURGERY — ICD IMPLANT

## 2021-02-21 MED ORDER — LIDOCAINE HCL (PF) 1 % IJ SOLN
INTRAMUSCULAR | Status: DC | PRN
  Administered 2021-02-21: 50 mL

## 2021-02-21 MED ORDER — ONDANSETRON HCL 4 MG/2ML IJ SOLN: 4.0000 mg | Freq: Four times a day (QID) | INTRAMUSCULAR | Status: DC | PRN

## 2021-02-21 MED ORDER — SODIUM CHLORIDE 0.9 % IV SOLN
INTRAVENOUS | Status: AC
  Filled 2021-02-21: qty 2

## 2021-02-21 MED ORDER — ACETAMINOPHEN 325 MG PO TABS
325.0000 mg | ORAL_TABLET | ORAL | Status: DC | PRN
  Filled 2021-02-21: qty 2

## 2021-02-21 MED ORDER — HEPARIN (PORCINE) IN NACL 1000-0.9 UT/500ML-% IV SOLN
INTRAVENOUS | Status: AC
  Filled 2021-02-21: qty 500

## 2021-02-21 MED ORDER — POVIDONE-IODINE 10 % EX SWAB
2.0000 "application " | Freq: Once | CUTANEOUS | Status: AC
  Administered 2021-02-21: 2 via TOPICAL

## 2021-02-21 MED ORDER — MIDAZOLAM HCL 5 MG/5ML IJ SOLN
INTRAMUSCULAR | Status: AC
  Filled 2021-02-21: qty 5

## 2021-02-21 MED ORDER — SODIUM CHLORIDE 0.9 % IV SOLN: INTRAVENOUS | Status: DC

## 2021-02-21 MED ORDER — FENTANYL CITRATE (PF) 100 MCG/2ML IJ SOLN
INTRAMUSCULAR | Status: DC | PRN
  Administered 2021-02-21: 12.5 ug via INTRAVENOUS
  Administered 2021-02-21: 25 ug via INTRAVENOUS

## 2021-02-21 MED ORDER — CEFAZOLIN SODIUM-DEXTROSE 2-4 GM/100ML-% IV SOLN
2.0000 g | INTRAVENOUS | Status: AC
  Administered 2021-02-21: 14:00:00 2 g via INTRAVENOUS

## 2021-02-21 MED ORDER — FENTANYL CITRATE (PF) 100 MCG/2ML IJ SOLN
INTRAMUSCULAR | Status: AC
  Filled 2021-02-21: qty 2

## 2021-02-21 MED ORDER — MIDAZOLAM HCL 5 MG/5ML IJ SOLN
INTRAMUSCULAR | Status: DC | PRN
  Administered 2021-02-21: 2 mg via INTRAVENOUS
  Administered 2021-02-21: 1 mg via INTRAVENOUS

## 2021-02-21 MED ORDER — CEFAZOLIN SODIUM-DEXTROSE 1-4 GM/50ML-% IV SOLN
1.0000 g | Freq: Once | INTRAVENOUS | Status: AC
  Administered 2021-02-21: 17:00:00 1 g via INTRAVENOUS
  Filled 2021-02-21: qty 50

## 2021-02-21 MED ORDER — LIDOCAINE HCL 1 % IJ SOLN
INTRAMUSCULAR | Status: AC
  Filled 2021-02-21: qty 60

## 2021-02-21 MED ORDER — CEFAZOLIN SODIUM-DEXTROSE 2-4 GM/100ML-% IV SOLN
INTRAVENOUS | Status: AC
  Filled 2021-02-21: qty 100

## 2021-02-21 MED ORDER — SODIUM CHLORIDE 0.9 % IV SOLN
80.0000 mg | INTRAVENOUS | Status: AC
  Administered 2021-02-21: 14:00:00 80 mg

## 2021-02-21 MED ORDER — CHLORHEXIDINE GLUCONATE 4 % EX LIQD: 4.0000 "application " | Freq: Once | CUTANEOUS | Status: DC

## 2021-02-21 MED ORDER — HEPARIN (PORCINE) IN NACL 1000-0.9 UT/500ML-% IV SOLN
INTRAVENOUS | Status: DC | PRN
  Administered 2021-02-21: 500 mL

## 2021-02-21 SURGICAL SUPPLY — 6 items
CABLE SURGICAL S-101-97-12 (CABLE) ×2 IMPLANT
ICD VISIA MRI DVFB1D1 (ICD Generator) ×1 IMPLANT
LEAD SPRINT QUAT SEC 6935-65CM (Lead) ×1 IMPLANT
PAD DEFIB RADIO PHYSIO CONN (PAD) ×2 IMPLANT
SHEATH 9FR PRELUDE SNAP 13 (SHEATH) ×1 IMPLANT
TRAY PACEMAKER INSERTION (PACKS) ×2 IMPLANT

## 2021-02-21 NOTE — H&P (Signed)
I have seen Brian Robinson is a 75 y.o. male in the office today who has been referred by Dr. Claiborne Billings for consideration of ICD implant for primary prevention of sudden death.  The patient's chart has been reviewed and they meet criteria for ICD implant.  I have had a thorough discussion with the patient reviewing options.  The patient and their family (if available) have had opportunities to ask questions and have them answered. The patient and I have decided together through a shared decision making process to proceed with ICD at this time.  Risks, benefits, alternatives to ICD implantation were discussed in detail with the patient today. The patient  understands that the risks include but are not limited to bleeding, infection, pneumothorax, perforation, tamponade, vascular damage, renal failure, MI, stroke, death, inappropriate shocks, and lead dislodgement and he wishes to proceed.  We will therefore schedule device implantation at the next available time.   Carleene Overlie Aamya Orellana,MD

## 2021-02-21 NOTE — Discharge Instructions (Addendum)
After Your ICD (Implantable Cardiac Defibrillator)   You have a [employer]onic ICD  ACTIVITY Do not lift your arm above shoulder height for 1 week after your procedure. After 7 days, you may progress as below.  You should remove your sling 24 hours after your procedure, unless otherwise instructed by your provider.     Monday February 28, 2021  Tuesday March 01, 2021 Wednesday March 02, 2021 Thursday March 03, 2021   Do not lift, push, pull, or carry anything over 10 pounds with the affected arm until 6 weeks (Monday April 04, 2021 ) after your procedure.   You may drive AFTER your wound check, unless you have been told otherwise by your provider.   Ask your healthcare provider when you can go back to work   INCISION/Dressing If you are on a blood thinner such as Coumadin, Xarelto, Eliquis, Plavix, or Pradaxa please confirm with your provider when this should be resumed.   If large square, outer bandage is left in place, this can be removed after 24 hours from your procedure. Do not remove steri-strips or glue as below.   Monitor your defibrillator site for redness, swelling, and drainage. Call the device clinic at [phone] if you experience these symptoms or fever/chills.  If your incision is sealed with Steri-strips or staples, you may shower 10 days after your procedure or when told by your provider. Do not remove the steri-strips or let the shower hit directly on your site. You may wash around your site with soap and water.    If you were discharged in a sling, please do not wear this during the day more than 48 hours after your surgery unless otherwise instructed. This may increase the risk of stiffness and soreness in your shoulder.   Avoid lotions, ointments, or perfumes over your incision until it is well-healed.  You may use a hot tub or a pool AFTER your wound check appointment if the incision is completely closed.  Your ICD is designed to protect you from life  threatening heart rhythms. Because of this, you may receive a shock.   1 shock with no symptoms:  Call the office during business hours. 1 shock with symptoms (chest pain, chest pressure, dizziness, lightheadedness, shortness of breath, overall feeling unwell):  Call 911. If you experience 2 or more shocks in 24 hours:  Call 911. If you receive a shock, you should not drive for 6 months per the [place] DMV IF you receive appropriate therapy from your ICD.   ICD Alerts:  Some alerts are vibratory and others beep. These are NOT emergencies. Please call our office to let us know. If this occurs at night or on weekends, it can wait until the next business day. Send a remote transmission.  If your device is capable of reading fluid status (for heart failure), you will be offered monthly monitoring to review this with you.   DEVICE MANAGEMENT Remote monitoring is used to monitor your ICD from home. This monitoring is scheduled every 91 days by our office. It allows [place] to keep an eye on the functioning of your device to ensure it is working properly. You will routinely see your Electrophysiologist annually (more often if necessary).   You should receive your ID card for your new device in 4-8 weeks. Keep this card with you at all times once received. Consider wearing a medical alert bracelet or necklace.  Your ICD  may be MRI compatible. This will be discussed at your next  office visit/wound check.  You should avoid contact with strong electric or magnetic fields.   Do not use amateur (ham) radio equipment or electric (arc) welding torches. MP3 player headphones with magnets should not be used. Some devices are safe to use if held at least 12 inches (30 cm) from your defibrillator. These include power tools, lawn mowers, and speakers. If you are unsure if something is safe to use, ask your health care provider.  When using your cell phone, hold it to the ear that is on the opposite side from the  defibrillator. Do not leave your cell phone in a pocket over the defibrillator.  You may safely use electric blankets, heating pads, computers, and microwave ovens.  Call the office right away if: You have chest pain. You feel more than one shock. You feel more short of breath than you have felt before. You feel more light-headed than you have felt before. Your incision starts to open up.  This information is not intended to replace advice given to you by your health care provider. Make sure you discuss any questions you have with your health care provider.

## 2021-02-21 NOTE — Progress Notes (Signed)
Discharge instructions reviewed with pt and his wife. Both voice understanding. 

## 2021-02-21 NOTE — Progress Notes (Unsigned)
Patient ID: Brian Robinson                 DOB: 1945/07/01                      MRN: [id number]     HPI: Brian Robinson is a 75 y.o. male referred by Dr. Marland Kitchen to pharmacy clinic for HF medication management. PMH is significant for VF arrest 09/15/2020, CABG 09/23/20, HTN, HLD, severe aortic stenosis, ICD placement 02/21/21. EF June 2022 s/p arrest was 55-60% but since declined, with most recent LVEF 25-30% on 12/27/20. R/LHC on 02/07/21 to assess for anatomic suitability for TAVR showed patent grafts.   Today he returns to pharmacy clinic for further medication titration. At last visit with MD ***. Symptomatically, he is feeling ***, *** dizziness, lightheadedness, and fatigue. *** chest pain or palpitations. Feels SOB when ***. Able to complete all ADLs. Activity level ***. He *** checks his weight at home (normal range *** - *** lbs). *** LEE, PND, or orthopnea. Appetite has been ***. He *** adheres to a low-salt diet.  *** ICD yesterday? Consolidate metop dosing? Incr spiro?  BMET after incr losartan + adding jardiance  Current CHF meds: metoprolol succinate 50 mg qAM and 25 mg qPM, losartan 100 mg daily, spironolactone 12.5 mg daily, Jardiance 10 mg daily, furosemide 40 mg daily Previously tried: Entresto (rash) BP goal: <130/80 mmHg  Family History: The patient's family history includes Cancer in his father and mother.   Social History: Never smoker  Diet:   Exercise:   Home BP readings:   Labs:  01/28/21: Scr 0.95, Na 135, K 4.0 (losartan 50 mg qAM and 25 mg qPM, spironolactone 12.5 mg daily)   Wt Readings from Last 3 Encounters:  02/21/21 176 lb (79.8 kg)  02/14/21 190 lb (86.2 kg)  02/07/21 190 lb (86.2 kg)   BP Readings from Last 3 Encounters:  02/21/21 (!) 163/94  02/14/21 130/72  02/07/21 (!) 167/96   Pulse Readings from Last 3 Encounters:  02/21/21 67  02/14/21 71  02/07/21 68    Renal function: CrCl cannot be calculated (Patient's most recent lab result is older  than the maximum 21 days allowed.).  Past Medical History:  Diagnosis Date   COVID-19    received Mab 01/2020   Diabetes mellitus without complication ([place])    Heart murmur    Hypertension    Mild aortic stenosis     Current Facility-Administered Medications on File Prior to Visit  Medication Dose Route Frequency Provider Last Rate Last Admin   0.9 %  sodium chloride infusion   Intravenous Continuous Evans Lance, MD 50 mL/hr at 02/21/21 1019 New Bag at 02/21/21 1019   ceFAZolin (ANCEF) IVPB 2g/100 mL premix  2 g Intravenous On Call Evans Lance, MD       chlorhexidine (HIBICLENS) 4 % liquid 4 application  4 application Topical Once Evans Lance, MD       gentamicin (GARAMYCIN) 80 mg in sodium chloride 0.9 % 500 mL irrigation  80 mg Irrigation On Call Evans Lance, MD       Current Outpatient Medications on File Prior to Visit  Medication Sig Dispense Refill   acetaminophen (TYLENOL) 500 MG tablet Take 2 tablets (1,000 mg total) by mouth every 6 (six) hours as needed. 30 tablet 0   ALPRAZolam (XANAX) 0.25 MG tablet Take 0.25 mg by mouth daily as needed for anxiety.     aspirin  81 MG EC tablet Take 1 tablet (81 mg total) by mouth daily.     cetirizine (ZYRTEC) 10 MG tablet Take 10 mg by mouth daily.     diphenhydrAMINE (BENADRYL) 25 mg capsule Take 25 mg by mouth every 6 (six) hours as needed for allergies or sleep.     empagliflozin (JARDIANCE) 10 MG TABS tablet Take 1 tablet (10 mg total) by mouth daily before breakfast. 30 tablet 11   ferrous sulfate 325 (65 FE) MG tablet Take 325 mg by mouth daily with breakfast.     furosemide (LASIX) 40 MG tablet Take 1 tablet (40 mg total) by mouth daily. 30 tablet 11   losartan (COZAAR) 100 MG tablet Take 1 tablet (100 mg total) by mouth daily. 90 tablet 3   metFORMIN (GLUCOPHAGE) 500 MG tablet Take 500 mg by mouth 2 (two) times daily with a meal.     metoprolol succinate (TOPROL XL) 50 MG 24 hr tablet Take 1 tablet in the morning  and one-half tablet in the evening 90 tablet 3   Multiple Vitamin (MULTI VITAMIN DAILY PO) Take 1 tablet by mouth daily.     Multiple Vitamins-Minerals (PRESERVISION AREDS 2) CAPS Take 1 capsule by mouth 2 (two) times daily.     rosuvastatin (CRESTOR) 20 MG tablet Take 1 tablet (20 mg total) by mouth daily. 90 tablet 3   sertraline (ZOLOFT) 50 MG tablet Take 50 mg by mouth in the morning.     spironolactone (ALDACTONE) 25 MG tablet Take 0.5 tablets (12.5 mg total) by mouth daily. 45 tablet 3    Allergies  Allergen Reactions   Entresto [Sacubitril-Valsartan] Rash     Assessment/Plan:  1. CHF -

## 2021-02-21 NOTE — Interval H&P Note (Signed)
History and Physical Interval Note:  02/21/2021 1:21 PM  Brian Robinson  has presented today for surgery, with the diagnosis of cardiomyopathy.  The various methods of treatment have been discussed with the patient and family. After consideration of risks, benefits and other options for treatment, the patient has consented to  Procedure(s): ICD IMPLANT (N/A) as a surgical intervention.  The patient's history has been reviewed, patient examined, no change in status, stable for surgery.  I have reviewed the patient's chart and labs.  Questions were answered to the patient's satisfaction.     Brian Robinson

## 2021-02-22 ENCOUNTER — Ambulatory Visit: Payer: Medicare Other

## 2021-02-22 ENCOUNTER — Telehealth: Payer: Self-pay

## 2021-02-22 ENCOUNTER — Encounter (HOSPITAL_COMMUNITY): Payer: Self-pay | Admitting: Internal Medicine

## 2021-02-22 NOTE — Telephone Encounter (Signed)
-----   Message from Shirley Friar, PA-C sent at 02/21/2021  3:21 PM EST ----- Same day ICD GT 11/28

## 2021-02-22 NOTE — Telephone Encounter (Signed)
Follow-up after same day discharge: Implant date: 11/28 MD: [name], MD Device: Medtronic [id number] Visia AF MRI VR Single chamber ICD Location: left chest   Wound check visit: 03/03/21 10:40 90 day MD follow-up: 05/27/21 3:30  Remote Transmission received:yes  Dressing removed: yes  Successful telephone encounter to patient to follow-up post implant. Patient states he is doing well. No pain, swelling, or bleeding noted. Appointments confirmed. Patient is provided device clinic contact. All questions answered.

## 2021-02-24 ENCOUNTER — Telehealth: Payer: Self-pay

## 2021-02-24 ENCOUNTER — Other Ambulatory Visit: Payer: Self-pay

## 2021-02-24 ENCOUNTER — Ambulatory Visit (INDEPENDENT_AMBULATORY_CARE_PROVIDER_SITE_OTHER): Payer: Medicare Other | Admitting: Pharmacist

## 2021-02-24 VITALS — BP 138/60 | HR 60 | Wt 178.6 lb

## 2021-02-24 DIAGNOSIS — I5022 Chronic systolic (congestive) heart failure: Secondary | ICD-10-CM

## 2021-02-24 DIAGNOSIS — I2510 Atherosclerotic heart disease of native coronary artery without angina pectoris: Secondary | ICD-10-CM

## 2021-02-24 LAB — BASIC METABOLIC PANEL
BUN/Creatinine Ratio: 27 — ABNORMAL HIGH (ref 10–24)
BUN: 21 mg/dL (ref 8–27)
CO2: 28 mmol/L (ref 20–29)
Calcium: 9.2 mg/dL (ref 8.6–10.2)
Chloride: 97 mmol/L (ref 96–106)
Creatinine, Ser: 0.79 mg/dL (ref 0.76–1.27)
Glucose: 78 mg/dL (ref 70–99)
Potassium: 3.6 mmol/L (ref 3.5–5.2)
Sodium: 136 mmol/L (ref 134–144)
eGFR: 92 mL/min/{1.73_m2} (ref >59)

## 2021-02-24 NOTE — Progress Notes (Addendum)
Patient ID: Brian Robinson                 DOB: 12-26-1945                      MRN: [id number]     HPI: Brian Robinson is a 75 y.o. male referred by Dr. Sarajane Marek to pharmacy clinic for HF medication management. PMH is significant for VF arrest s/p ICD, CABG persistently LV dysfunction, DM and HTN. Most recent LVEF 25-30% on 12/27/20.  Today he presents to pharmacy clinic for further medication titration. At last visit with MD furosemide was increased to 40mg  daily (11/4). He had a heart cath on 11/14 and an ICD placed 11/28. Symptomatically, he is feeling great, denies dizziness, lightheadedness, and fatigue. No SOB. Able to complete all ADLs. Activity level good. He checks his weight at home (normal range 174 - 176 lbs). No LEE, PND, or orthopnea. Appetite has been good. He adheres to a low-salt diet.  Current CHF meds: furosemide 40mg  daily, losartan 100mg  daily, Jardiance 10mg  daily, spironolactone 12.5mg  daily, metoprolol succinate 50mg  q AM and 25mg  in the PM Previously tried: Entresto (rash) BP goal: <130/80  Family History:  Family History  Problem Relation Age of Onset   Cancer Mother    Cancer Father    CAD Neg Hx     Social History:  Social History   Socioeconomic History   Marital status: Married    Spouse name: Not on file   Number of children: Not on file   Years of education: Not on file   Highest education level: Not on file  Occupational History   Not on file  Tobacco Use   Smoking status: Never   Smokeless tobacco: Never  Substance and Sexual Activity   Alcohol use: Not on file   Drug use: Not on file   Sexual activity: Not on file  Other Topics Concern   Not on file  Social History Narrative   Not on file   Social Determinants of Health   Financial Resource Strain: Not on file  Food Insecurity: Not on file  Transportation Needs: Not on file  Physical Activity: Not on file  Stress: Not on file  Social Connections: Not on file  Intimate Partner  Violence: Not on file    Diet: low Na diet  Exercise: not addressed  Home BP readings: does not check BP often at home as it was causing him stress  Wt Readings from Last 3 Encounters:  02/21/21 176 lb (79.8 kg)  02/14/21 190 lb (86.2 kg)  02/07/21 190 lb (86.2 kg)   BP Readings from Last 3 Encounters:  02/21/21 (!) 119/43  02/14/21 130/72  02/07/21 (!) 167/96   Pulse Readings from Last 3 Encounters:  02/21/21 (!) 35  02/14/21 71  02/07/21 68    Renal function: CrCl cannot be calculated (Patient's most recent lab result is older than the maximum 21 days allowed.).  Past Medical History:  Diagnosis Date   COVID-19    received Mab 01/2020   Diabetes mellitus without complication ([facility])    Heart murmur    Hypertension    Mild aortic stenosis     Current Outpatient Medications on File Prior to Visit  Medication Sig Dispense Refill   acetaminophen (TYLENOL) 500 MG tablet Take 2 tablets (1,000 mg total) by mouth every 6 (six) hours as needed. 30 tablet 0   ALPRAZolam (XANAX) 0.25 MG tablet Take 0.25 mg by mouth  daily as needed for anxiety.     aspirin 81 MG EC tablet Take 1 tablet (81 mg total) by mouth daily.     cetirizine (ZYRTEC) 10 MG tablet Take 10 mg by mouth daily.     diphenhydrAMINE (BENADRYL) 25 mg capsule Take 25 mg by mouth every 6 (six) hours as needed for allergies or sleep.     empagliflozin (JARDIANCE) 10 MG TABS tablet Take 1 tablet (10 mg total) by mouth daily before breakfast. 30 tablet 11   ferrous sulfate 325 (65 FE) MG tablet Take 325 mg by mouth daily with breakfast.     furosemide (LASIX) 40 MG tablet Take 1 tablet (40 mg total) by mouth daily. 30 tablet 11   losartan (COZAAR) 100 MG tablet Take 1 tablet (100 mg total) by mouth daily. 90 tablet 3   metFORMIN (GLUCOPHAGE) 500 MG tablet Take 500 mg by mouth 2 (two) times daily with a meal.     metoprolol succinate (TOPROL XL) 50 MG 24 hr tablet Take 1 tablet in the morning and one-half tablet in the  evening 90 tablet 3   Multiple Vitamin (MULTI VITAMIN DAILY PO) Take 1 tablet by mouth daily.     Multiple Vitamins-Minerals (PRESERVISION AREDS 2) CAPS Take 1 capsule by mouth 2 (two) times daily.     rosuvastatin (CRESTOR) 20 MG tablet Take 1 tablet (20 mg total) by mouth daily. 90 tablet 3   sertraline (ZOLOFT) 50 MG tablet Take 50 mg by mouth in the morning.     spironolactone (ALDACTONE) 25 MG tablet Take 0.5 tablets (12.5 mg total) by mouth daily. 45 tablet 3   No current facility-administered medications on file prior to visit.    Allergies  Allergen Reactions   Entresto [Sacubitril-Valsartan] Rash     Assessment/Plan:  1. CHF - BP is above goal of <130/80 in clinic today. Patient is on all 4 pillars of HF medications. He had a rash to [facility]. Both losartan and spironolactone can be titrated. Could also change losartan to a stronger HF ARB if needed. Might avoid valsartan due to rash with Entresto. Patient has not had scr checked since increase in furosemide. Will check BMP today. If looks good will plan to increase spironolactone to 25mg  daily. HR was 60 in clinic. He does have an ICD with pacing function. Will consider increasing metoprolol at next visit.   Ramond Dial, Pharm.D, BCPS, CPP [place]  [address], [place], [place] [address]  Phone: [phone]; Fax: [phone]

## 2021-02-24 NOTE — Patient Instructions (Addendum)
Please continue furosemide 40mg  daily, losartan 100mg  daily, Jardiance 10mg  daily, spironolactone 12.5mg  daily, metoprolol succinate 50mg  in the AM and 25mg  in the PM.  I will call you with your lab results tomorrow. If everything looks good, we will plan to increase spironolactone to 25mg  daily.  Please check you blood pressure and heart rate a few times a week  Please call me with any questions [phone]

## 2021-02-24 NOTE — Telephone Encounter (Signed)
Patient was seen in device clinic today.

## 2021-02-25 ENCOUNTER — Other Ambulatory Visit: Payer: Self-pay | Admitting: Pharmacist

## 2021-02-25 MED ORDER — SPIRONOLACTONE 25 MG PO TABS: 25.0000 mg | ORAL_TABLET | Freq: Every day | ORAL | 3 refills | Status: DC

## 2021-02-25 MED ORDER — METOPROLOL SUCCINATE ER 50 MG PO TB24: 50.0000 mg | ORAL_TABLET | Freq: Two times a day (BID) | ORAL | 3 refills | Status: DC

## 2021-03-03 ENCOUNTER — Ambulatory Visit (INDEPENDENT_AMBULATORY_CARE_PROVIDER_SITE_OTHER): Payer: Medicare Other

## 2021-03-03 ENCOUNTER — Other Ambulatory Visit: Payer: Self-pay

## 2021-03-03 DIAGNOSIS — I469 Cardiac arrest, cause unspecified: Secondary | ICD-10-CM

## 2021-03-03 DIAGNOSIS — E1169 Type 2 diabetes mellitus with other specified complication: Secondary | ICD-10-CM | POA: Diagnosis not present

## 2021-03-03 DIAGNOSIS — G4700 Insomnia, unspecified: Secondary | ICD-10-CM | POA: Diagnosis not present

## 2021-03-03 DIAGNOSIS — E785 Hyperlipidemia, unspecified: Secondary | ICD-10-CM | POA: Diagnosis not present

## 2021-03-03 DIAGNOSIS — I10 Essential (primary) hypertension: Secondary | ICD-10-CM | POA: Diagnosis not present

## 2021-03-03 LAB — CUP PACEART INCLINIC DEVICE CHECK
Battery Remaining Longevity: 136 mo
Battery Voltage: 3.16 V
Brady Statistic RV Percent Paced: 3.21 %
Date Time Interrogation Session: 20221208124557
HighPow Impedance: 52 Ohm
Implantable Lead Implant Date: 20221128
Implantable Lead Location: 753860
Implantable Lead Model: 6935
Implantable Pulse Generator Implant Date: 20221128
Lead Channel Impedance Value: 304 Ohm
Lead Channel Impedance Value: 399 Ohm
Lead Channel Pacing Threshold Amplitude: 0.75 V
Lead Channel Pacing Threshold Pulse Width: 0.4 ms
Lead Channel Sensing Intrinsic Amplitude: 18.125 mV
Lead Channel Sensing Intrinsic Amplitude: 19 mV
Lead Channel Setting Pacing Amplitude: 3.5 V
Lead Channel Setting Pacing Pulse Width: 0.4 ms
Lead Channel Setting Sensing Sensitivity: 0.3 mV

## 2021-03-03 NOTE — Progress Notes (Signed)
Wound check appointment. Steri-strips removed. Incision edges approximated, wound well healed. Hematoma noted with purple to yellow bruising, site assessed by GT recommended patient stop ASA 81mg  x 4 weeks.  Normal device function. Threshold, sensing, and impedances consistent with implant measurements. Device programmed at 3.5V for extra safety margin until 3 month visit. Histogram distribution appropriate for patient and level of activity. 2 AF episodes EGMs illustlrate SR with ectopy longest episode 88min duration. No ventricular arrhythmias noted. Patient educated about wound care, arm mobility, lifting restrictions, shock plan. ROV 05/27/2021

## 2021-03-03 NOTE — Patient Instructions (Signed)
   After Your ICD (Implantable Cardiac Defibrillator)    Monitor your defibrillator site for redness, swelling, and drainage. Call the device clinic at [phone] if you experience these symptoms or fever/chills.  Your incision was closed with Steri-strips or staples:  You may shower 7 days after your procedure and wash your incision with soap and water. Avoid lotions, ointments, or perfumes over your incision until it is well-healed.  You may use a hot tub or a pool after your wound check appointment if the incision is completely closed.  Do not lift, push or pull greater than 10 pounds with the affected arm until 6 weeks after your procedure. There are no other restrictions in arm movement after your wound check appointment.  Your ICD is MRI compatible.  Your ICD is designed to protect you from life threatening heart rhythms. Because of this, you may receive a shock.   1 shock with no symptoms:  Call the office during business hours. 1 shock with symptoms (chest pain, chest pressure, dizziness, lightheadedness, shortness of breath, overall feeling unwell):  Call 911. If you experience 2 or more shocks in 24 hours:  Call 911. If you receive a shock, you should not drive.  [place] DMV - no driving for 6 months if you receive appropriate therapy from your ICD.    ICD Alerts:  Some alerts are vibratory and others beep. These are NOT emergencies. Please call our office to let us know. If this occurs at night or on weekends, it can wait until the next business day. Send a remote transmission.  If your device is capable of reading fluid status (for heart failure), you will be offered monthly monitoring to review this with you.   Remote monitoring is used to monitor your ICD from home. This monitoring is scheduled every 91 days by our office. It allows [place] to keep an eye on the functioning of your device to ensure it is working properly. You will routinely see your Electrophysiologist annually  (more often if necessary).   HOLD ASPIRIN 81 MG FOR 4 WEEKS.

## 2021-03-07 MED ORDER — FUROSEMIDE 40 MG PO TABS: 20.0000 mg | ORAL_TABLET | Freq: Every day | ORAL | 3 refills | Status: DC

## 2021-03-15 ENCOUNTER — Ambulatory Visit (INDEPENDENT_AMBULATORY_CARE_PROVIDER_SITE_OTHER): Payer: Medicare Other | Admitting: Pharmacist

## 2021-03-15 ENCOUNTER — Other Ambulatory Visit: Payer: Self-pay

## 2021-03-15 VITALS — BP 144/72 | HR 44 | Wt 178.8 lb

## 2021-03-15 DIAGNOSIS — I2510 Atherosclerotic heart disease of native coronary artery without angina pectoris: Secondary | ICD-10-CM | POA: Diagnosis not present

## 2021-03-15 DIAGNOSIS — I5022 Chronic systolic (congestive) heart failure: Secondary | ICD-10-CM

## 2021-03-15 DIAGNOSIS — I5042 Chronic combined systolic (congestive) and diastolic (congestive) heart failure: Secondary | ICD-10-CM | POA: Diagnosis not present

## 2021-03-15 MED ORDER — CANDESARTAN CILEXETIL 16 MG PO TABS: 16.0000 mg | ORAL_TABLET | Freq: Every day | ORAL | 3 refills | Status: DC

## 2021-03-15 NOTE — Progress Notes (Signed)
Patient ID: Brian Robinson                 DOB: [birth year]                      MRN: [id number]     HPI: Brian Robinson is a 75 y.o. male referred by Dr. Sarajane Marek to pharmacy clinic for HF medication management. PMH is significant for VF arrest s/p ICD, CABG persistently LV dysfunction, DM and HTN. Most recent LVEF 25-30% on 12/27/20.  Today he presents to pharmacy clinic for further medication titration. At last visit with pharmD spironolactone was increased to 25mg  daily and metoprolol was increased to 50mg  twice a day. He had a heart cath on 11/14 and an ICD placed 11/28. Symptomatically, he is feeling great, denies dizziness, lightheadedness, and fatigue. No SOB. Able to complete all ADLs. Activity level good. He checks his weight at home (normal range 173 - 175 lbs). No LEE, PND, or orthopnea. Appetite has been good. He adheres to a low-salt diet. Furosemide was decreased to 20mg  daily. Swelling well controlled. Home BP is high in the AM before medications. Comes down by the afternoon, but still high in the 147'W systolic.  Current CHF meds: furosemide 20mg  daily, losartan 100mg  daily, Jardiance 10mg  daily, spironolactone 25mg  daily, metoprolol succinate 50mg  twice a day Previously tried: Entresto (rash) BP goal: <130/80  Family History:  Family History  Problem Relation Age of Onset   Cancer Mother    Cancer Father    CAD Neg Hx     Social History:  Social History   Socioeconomic History   Marital status: Married    Spouse name: Not on file   Number of children: Not on file   Years of education: Not on file   Highest education level: Not on file  Occupational History   Not on file  Tobacco Use   Smoking status: Never   Smokeless tobacco: Never  Substance and Sexual Activity   Alcohol use: Not on file   Drug use: Not on file   Sexual activity: Not on file  Other Topics Concern   Not on file  Social History Narrative   Not on file   Social Determinants of Health    Financial Resource Strain: Not on file  Food Insecurity: Not on file  Transportation Needs: Not on file  Physical Activity: Not on file  Stress: Not on file  Social Connections: Not on file  Intimate Partner Violence: Not on file    Diet: low Na diet  Exercise: active outside when its warmer, walks in stores  Home BP readings: 135/65-70 HR 60's   Wt Readings from Last 3 Encounters:  02/24/21 178 lb 9.6 oz (81 kg)  02/21/21 176 lb (79.8 kg)  02/14/21 190 lb (86.2 kg)   BP Readings from Last 3 Encounters:  02/24/21 138/60  02/21/21 (!) 119/43  02/14/21 130/72   Pulse Readings from Last 3 Encounters:  02/24/21 60  02/21/21 (!) 35  02/14/21 71    Renal function: CrCl cannot be calculated (Unknown ideal weight.).  Past Medical History:  Diagnosis Date   COVID-19    received Mab 01/2020   Diabetes mellitus without complication (HCC)    Heart murmur    Hypertension    Mild aortic stenosis     Current Outpatient Medications on File Prior to Visit  Medication Sig Dispense Refill   acetaminophen (TYLENOL) 500 MG tablet Take 2 tablets (1,000 mg total) by  mouth every 6 (six) hours as needed. 30 tablet 0   ALPRAZolam (XANAX) 0.25 MG tablet Take 0.25 mg by mouth daily as needed for anxiety.     aspirin 81 MG EC tablet Take 1 tablet (81 mg total) by mouth daily. (Patient taking differently: Take 81 mg by mouth daily. HOLD FOR 4 WEEKS DUE TO HEMATOMA AT ICD SITE)     cetirizine (ZYRTEC) 10 MG tablet Take 10 mg by mouth daily.     diphenhydrAMINE (BENADRYL) 25 mg capsule Take 25 mg by mouth every 6 (six) hours as needed for allergies or sleep.     empagliflozin (JARDIANCE) 10 MG TABS tablet Take 1 tablet (10 mg total) by mouth daily before breakfast. 30 tablet 11   ferrous sulfate 325 (65 FE) MG tablet Take 325 mg by mouth daily with breakfast.     furosemide (LASIX) 40 MG tablet Take 0.5 tablets (20 mg total) by mouth daily. 45 tablet 3   losartan (COZAAR) 100 MG tablet Take  1 tablet (100 mg total) by mouth daily. 90 tablet 3   metFORMIN (GLUCOPHAGE) 500 MG tablet Take 500 mg by mouth 2 (two) times daily with a meal.     metoprolol succinate (TOPROL-XL) 50 MG 24 hr tablet Take 1 tablet (50 mg total) by mouth 2 (two) times daily. Take with or immediately following a meal. 180 tablet 3   Multiple Vitamin (MULTI VITAMIN DAILY PO) Take 1 tablet by mouth daily.     Multiple Vitamins-Minerals (PRESERVISION AREDS 2) CAPS Take 1 capsule by mouth 2 (two) times daily.     rosuvastatin (CRESTOR) 20 MG tablet Take 1 tablet (20 mg total) by mouth daily. 90 tablet 3   sertraline (ZOLOFT) 50 MG tablet Take 50 mg by mouth in the morning.     spironolactone (ALDACTONE) 25 MG tablet Take 1 tablet (25 mg total) by mouth daily. 90 tablet 3   No current facility-administered medications on file prior to visit.    Allergies  Allergen Reactions   Entresto [Sacubitril-Valsartan] Rash     Assessment/Plan:  1. CHF - BP is above goal of <130/80 in clinic today. Patient is on all 4 pillars of HF medications. He had a rash to [facility]. Will change losartan to candesartan 16mg  daily for better BP lowering. Continue furosemide 20mg  daily, Jardiance 10mg  daily, spironolactone  25mg  daily, metoprolol succinate 50mg  twice a day. Checking BMP today since increasing spironolactone. Will check CMP again on 12/29 when he comes for lipid panel. Follow up in office in 3 weeks.   Ramond Dial, Pharm.D, BCPS, CPP [place]  [address], [place], [place] [address]  Phone: [phone]; Fax: [phone]

## 2021-03-15 NOTE — Patient Instructions (Addendum)
Please START taking candesartan 16mg  daily STOP taking losartan  Continue furosemide 20mg  daily, Jardiance 10mg  daily, spironolactone  25mg  daily, metoprolol succinate 50mg  twice a day  Please continue checking blood pressure at home  Call me at [phone] with any questions

## 2021-03-16 LAB — BASIC METABOLIC PANEL
BUN/Creatinine Ratio: 18 (ref 10–24)
BUN: 17 mg/dL (ref 8–27)
CO2: 25 mmol/L (ref 20–29)
Calcium: 9.5 mg/dL (ref 8.6–10.2)
Chloride: 96 mmol/L (ref 96–106)
Creatinine, Ser: 0.96 mg/dL (ref 0.76–1.27)
Glucose: 90 mg/dL (ref 70–99)
Potassium: 4.1 mmol/L (ref 3.5–5.2)
Sodium: 138 mmol/L (ref 134–144)
eGFR: 82 mL/min/{1.73_m2} (ref >59)

## 2021-03-23 ENCOUNTER — Encounter: Payer: Self-pay | Admitting: Internal Medicine

## 2021-03-24 ENCOUNTER — Other Ambulatory Visit: Payer: Self-pay | Admitting: Pharmacist

## 2021-03-24 ENCOUNTER — Other Ambulatory Visit: Payer: Self-pay

## 2021-03-24 ENCOUNTER — Other Ambulatory Visit: Payer: Medicare Other | Admitting: *Deleted

## 2021-03-24 DIAGNOSIS — I10 Essential (primary) hypertension: Secondary | ICD-10-CM | POA: Diagnosis not present

## 2021-03-24 DIAGNOSIS — I5042 Chronic combined systolic (congestive) and diastolic (congestive) heart failure: Secondary | ICD-10-CM | POA: Diagnosis not present

## 2021-03-24 DIAGNOSIS — I350 Nonrheumatic aortic (valve) stenosis: Secondary | ICD-10-CM | POA: Diagnosis not present

## 2021-03-24 DIAGNOSIS — I2510 Atherosclerotic heart disease of native coronary artery without angina pectoris: Secondary | ICD-10-CM

## 2021-03-24 LAB — COMPREHENSIVE METABOLIC PANEL
ALT: 11 IU/L (ref 0–44)
AST: 24 IU/L (ref 0–40)
Albumin/Globulin Ratio: 1.6 (ref 1.2–2.2)
Albumin: 4.5 g/dL (ref 3.7–4.7)
Alkaline Phosphatase: 92 IU/L (ref 44–121)
BUN/Creatinine Ratio: 16 (ref 10–24)
BUN: 16 mg/dL (ref 8–27)
Bilirubin Total: 0.6 mg/dL (ref 0.0–1.2)
CO2: 28 mmol/L (ref 20–29)
Calcium: 9.5 mg/dL (ref 8.6–10.2)
Chloride: 98 mmol/L (ref 96–106)
Creatinine, Ser: 1.02 mg/dL (ref 0.76–1.27)
Globulin, Total: 2.9 g/dL (ref 1.5–4.5)
Glucose: 97 mg/dL (ref 70–99)
Potassium: 4.3 mmol/L (ref 3.5–5.2)
Sodium: 137 mmol/L (ref 134–144)
Total Protein: 7.4 g/dL (ref 6.0–8.5)
eGFR: 76 mL/min/{1.73_m2} (ref >59)

## 2021-03-24 LAB — LIPID PANEL
Chol/HDL Ratio: 3 ratio (ref 0.0–5.0)
Cholesterol, Total: 123 mg/dL (ref 100–199)
HDL: 41 mg/dL (ref >39)
LDL Chol Calc (NIH): 65 mg/dL (ref 0–99)
Triglycerides: 85 mg/dL (ref 0–149)
VLDL Cholesterol Cal: 17 mg/dL (ref 5–40)

## 2021-03-24 MED ORDER — EZETIMIBE 10 MG PO TABS: 10.0000 mg | ORAL_TABLET | Freq: Every day | ORAL | 3 refills | Status: DC

## 2021-04-06 ENCOUNTER — Other Ambulatory Visit: Payer: Self-pay

## 2021-04-06 ENCOUNTER — Ambulatory Visit (INDEPENDENT_AMBULATORY_CARE_PROVIDER_SITE_OTHER): Payer: Medicare Other | Admitting: Pharmacist

## 2021-04-06 VITALS — BP 140/70 | HR 63 | Wt 174.8 lb

## 2021-04-06 DIAGNOSIS — I5022 Chronic systolic (congestive) heart failure: Secondary | ICD-10-CM | POA: Diagnosis not present

## 2021-04-06 MED ORDER — CANDESARTAN CILEXETIL 32 MG PO TABS: 32.0000 mg | ORAL_TABLET | Freq: Every day | ORAL | 3 refills | Status: DC

## 2021-04-06 NOTE — Patient Instructions (Addendum)
Please increase candesartan to 32mg  daily. You may take 2 of the 16mg  tablets to equal 32mg  daily until you run out   Continue furosemide 20mg  daily, Jardiance 10mg  daily, spironolactone 25mg  daily, metoprolol succinate 50mg  twice a day.  Call me at [phone] with any questions  Continue checking blood pressure and weight daily

## 2021-04-06 NOTE — Progress Notes (Signed)
Patient ID: Brian Robinson                 DOB: July 13, 1945                      MRN: [id number]     HPI: Brian Robinson is a 76 y.o. male referred by Dr. Burt Knack to pharmacy clinic for HF medication management. PMH is significant for VF arrest s/p ICD, CABG persistently LV dysfunction, DM and HTN. Most recent LVEF 25-30% on 12/27/20.  On 02/24/21, he presented to pharmacy clinic for further medication titration. Spironolactone was increased from 12.5 mg to 25mg  daily and metoprolol was increased to 50mg  twice a day. He had a heart cath on 11/14 and an ICD placed 11/28. Activity level and appetite had been good. He checks his weight at home (normal range 173 - 175 lbs). He adheres to a low-salt diet.  On 03/07/21, furosemide was decreased to 20mg  daily. At next clinic visit on 12/20, BP was above goal and losartan 100 mg daily was changed to candesartan 16 mg daily. Swelling was well controlled. Home BP was high in the AM before medications, but had been coming down by the afternoon, but still high in the 557'D systolic.  Today, pt presents accompanied by his wife. He reports that home blood pressure range from 124-148/55-79 mmHg. Weight remains relatively stable (within a few lbs per day.) Pt reports no issues after switching to candesartan 16 mg daily. Pt has not taken all blood pressure medications today yet. Denies swelling, SOB or dizziness/lightheadedness.  Current CHF meds: furosemide 20mg  daily, candesartan 16 mg daily, Jardiance 10mg  daily, spironolactone 25mg  daily, metoprolol succinate 50mg  BID  Previously tried: Entresto (rash), losartan 100mg  daily (switch to candesartan)  BP goal: <130/80  Family History:  Family History  Problem Relation Age of Onset   Cancer Mother    Cancer Father    CAD Neg Hx     Social History:  Social History   Socioeconomic History   Marital status: Married    Spouse name: Not on file   Number of children: Not on file   Years of education: Not on  file   Highest education level: Not on file  Occupational History   Not on file  Tobacco Use   Smoking status: Never   Smokeless tobacco: Never  Substance and Sexual Activity   Alcohol use: Not on file   Drug use: Not on file   Sexual activity: Not on file  Other Topics Concern   Not on file  Social History Narrative   Not on file   Social Determinants of Health   Financial Resource Strain: Not on file  Food Insecurity: Not on file  Transportation Needs: Not on file  Physical Activity: Not on file  Stress: Not on file  Social Connections: Not on file  Intimate Partner Violence: Not on file    Diet: low Na diet  Exercise: active outside when its warmer, walks in stores  Home BP readings: 124-148/55-79 mmHg  Wt Readings from Last 3 Encounters:  03/15/21 178 lb 12.8 oz (81.1 kg)  02/24/21 178 lb 9.6 oz (81 kg)  02/21/21 176 lb (79.8 kg)   BP Readings from Last 3 Encounters:  03/15/21 (!) 144/72  02/24/21 138/60  02/21/21 (!) 119/43   Pulse Readings from Last 3 Encounters:  03/15/21 (!) 44  02/24/21 60  02/21/21 (!) 35    Renal function: CrCl cannot be calculated (Unknown ideal weight.).  Past Medical History:  Diagnosis Date   COVID-19    received Mab 01/2020   Diabetes mellitus without complication (HCC)    Heart murmur    Hypertension    Mild aortic stenosis     Current Outpatient Medications on File Prior to Visit  Medication Sig Dispense Refill   acetaminophen (TYLENOL) 500 MG tablet Take 2 tablets (1,000 mg total) by mouth every 6 (six) hours as needed. 30 tablet 0   ALPRAZolam (XANAX) 0.25 MG tablet Take 0.25 mg by mouth daily as needed for anxiety.     aspirin 81 MG EC tablet Take 1 tablet (81 mg total) by mouth daily. (Patient taking differently: Take 81 mg by mouth daily. HOLD FOR 4 WEEKS DUE TO HEMATOMA AT ICD SITE)     candesartan (ATACAND) 16 MG tablet Take 1 tablet (16 mg total) by mouth daily. 90 tablet 3   cetirizine (ZYRTEC) 10 MG  tablet Take 10 mg by mouth daily.     diphenhydrAMINE (BENADRYL) 25 mg capsule Take 25 mg by mouth every 6 (six) hours as needed for allergies or sleep.     empagliflozin (JARDIANCE) 10 MG TABS tablet Take 1 tablet (10 mg total) by mouth daily before breakfast. 30 tablet 11   ezetimibe (ZETIA) 10 MG tablet Take 1 tablet (10 mg total) by mouth daily. 90 tablet 3   ferrous sulfate 325 (65 FE) MG tablet Take 325 mg by mouth daily with breakfast.     furosemide (LASIX) 40 MG tablet Take 0.5 tablets (20 mg total) by mouth daily. 45 tablet 3   metFORMIN (GLUCOPHAGE) 500 MG tablet Take 500 mg by mouth 2 (two) times daily with a meal.     metoprolol succinate (TOPROL-XL) 50 MG 24 hr tablet Take 1 tablet (50 mg total) by mouth 2 (two) times daily. Take with or immediately following a meal. 180 tablet 3   Multiple Vitamin (MULTI VITAMIN DAILY PO) Take 1 tablet by mouth daily.     Multiple Vitamins-Minerals (PRESERVISION AREDS 2) CAPS Take 1 capsule by mouth 2 (two) times daily.     rosuvastatin (CRESTOR) 20 MG tablet Take 1 tablet (20 mg total) by mouth daily. 90 tablet 3   sertraline (ZOLOFT) 50 MG tablet Take 50 mg by mouth in the morning.     spironolactone (ALDACTONE) 25 MG tablet Take 1 tablet (25 mg total) by mouth daily. 90 tablet 3   No current facility-administered medications on file prior to visit.    Allergies  Allergen Reactions   Entresto [Sacubitril-Valsartan] Rash     Assessment/Plan:  1. CHF - BP of 140/70 is elevated above goal of <130/80 in clinic today. Patient is on all 4 pillars of HF medications. He had a rash to [facility]. Will increase candesartan from 16 mg daily to 32 mg daily (target dose). Plan to speak with Dr. Lovena Le prior to increasing metoprolol succinate to confirm this is ok to do with his current HR and ICD/pacer device. Encouraged pt to continue monitoring weight and home blood pressures at home. F/u in 2 weeks for medication optimization. He will need a repeat BMP  at that visit.  Jethro Poling, PharmD Candidate [phone] assisted in this visit.  Ramond Dial, Pharm.D, BCPS, CPP [place]  [address], [place], [place] [address]  Phone: [phone]; Fax: [phone]

## 2021-04-07 DIAGNOSIS — H35033 Hypertensive retinopathy, bilateral: Secondary | ICD-10-CM | POA: Diagnosis not present

## 2021-04-07 DIAGNOSIS — E119 Type 2 diabetes mellitus without complications: Secondary | ICD-10-CM | POA: Diagnosis not present

## 2021-04-07 DIAGNOSIS — H40013 Open angle with borderline findings, low risk, bilateral: Secondary | ICD-10-CM | POA: Diagnosis not present

## 2021-04-07 DIAGNOSIS — H35371 Puckering of macula, right eye: Secondary | ICD-10-CM | POA: Diagnosis not present

## 2021-04-14 ENCOUNTER — Encounter (HOSPITAL_BASED_OUTPATIENT_CLINIC_OR_DEPARTMENT_OTHER): Payer: Self-pay

## 2021-04-14 DIAGNOSIS — I10 Essential (primary) hypertension: Secondary | ICD-10-CM | POA: Diagnosis not present

## 2021-04-14 DIAGNOSIS — E119 Type 2 diabetes mellitus without complications: Secondary | ICD-10-CM | POA: Diagnosis not present

## 2021-04-14 DIAGNOSIS — E1169 Type 2 diabetes mellitus with other specified complication: Secondary | ICD-10-CM | POA: Diagnosis not present

## 2021-04-14 DIAGNOSIS — G4700 Insomnia, unspecified: Secondary | ICD-10-CM | POA: Diagnosis not present

## 2021-04-14 DIAGNOSIS — E785 Hyperlipidemia, unspecified: Secondary | ICD-10-CM | POA: Diagnosis not present

## 2021-04-14 DIAGNOSIS — E7800 Pure hypercholesterolemia, unspecified: Secondary | ICD-10-CM | POA: Diagnosis not present

## 2021-04-15 NOTE — Telephone Encounter (Signed)
Please advise 

## 2021-04-18 ENCOUNTER — Encounter (HOSPITAL_BASED_OUTPATIENT_CLINIC_OR_DEPARTMENT_OTHER): Payer: Self-pay | Admitting: Family

## 2021-04-21 ENCOUNTER — Other Ambulatory Visit: Payer: Self-pay

## 2021-04-21 ENCOUNTER — Ambulatory Visit (INDEPENDENT_AMBULATORY_CARE_PROVIDER_SITE_OTHER): Payer: Medicare Other | Admitting: Pharmacist

## 2021-04-21 VITALS — BP 97/53 | HR 64 | Wt 177.0 lb

## 2021-04-21 DIAGNOSIS — I5022 Chronic systolic (congestive) heart failure: Secondary | ICD-10-CM

## 2021-04-21 LAB — BASIC METABOLIC PANEL
BUN/Creatinine Ratio: 20 (ref 10–24)
BUN: 23 mg/dL (ref 8–27)
CO2: 27 mmol/L (ref 20–29)
Calcium: 9.6 mg/dL (ref 8.6–10.2)
Chloride: 98 mmol/L (ref 96–106)
Creatinine, Ser: 1.13 mg/dL (ref 0.76–1.27)
Glucose: 90 mg/dL (ref 70–99)
Potassium: 4.6 mmol/L (ref 3.5–5.2)
Sodium: 139 mmol/L (ref 134–144)
eGFR: 67 mL/min/{1.73_m2} (ref >59)

## 2021-04-21 NOTE — Patient Instructions (Addendum)
Continue taking furosemide 20mg  daily, candesartan 32 mg daily, Jardiance 10mg  daily, spironolactone 25mg  daily, metoprolol succinate 50mg  twice a day  Please bring your blood pressure cuff to your appointment with Dr. Burt Knack.  Call me at [phone] with any questions

## 2021-04-21 NOTE — Progress Notes (Signed)
Patient ID: Brian Robinson                 DOB: 1945/10/08                      MRN: [id number]     HPI: Brian Robinson is a 76 y.o. male referred by Dr. Oswald Hillock to pharmacy clinic for HF medication management. PMH is significant for VF arrest s/p ICD, CABG persistently LV dysfunction, DM and HTN. Most recent LVEF 25-30% on 12/27/20.  On 02/24/21, he presented to pharmacy clinic for further medication titration. Spironolactone was increased from 12.5 mg to 25mg  daily and metoprolol was increased to 50mg  twice a day. He had a heart cath on 11/14 and an ICD placed 11/28. Activity level and appetite had been good. He checks his weight at home (normal range 173 - 175 lbs). He adheres to a low-salt diet.  On 03/07/21, furosemide was decreased to 20mg  daily. At next clinic visit on 12/20, BP was above goal and losartan 100 mg daily was changed to candesartan 16 mg daily. Swelling was well controlled. Home BP was high in the AM before medications, but had been coming down by the afternoon, but still high in the 427'C systolic. At the last visit, candesartan was increased to target dose of 32mg  daily.  Today, pt presents accompanied by his wife. He reports that home blood pressure range from 623-762 systolic. Weight remains relatively stable (within a few lbs per day.) Pt reports no issues after increasing to candesartan 32 mg daily. Denies swelling, SOB or dizziness/lightheadedness.  Current CHF meds: furosemide 20mg  daily, candesartan 32 mg daily, Jardiance 10mg  daily, spironolactone 25mg  daily, metoprolol succinate 50mg  BID  Previously tried: Entresto (rash), losartan 100mg  daily (switch to candesartan)  BP goal: <130/80  Family History:  Family History  Problem Relation Age of Onset   Cancer Mother    Cancer Father    CAD Neg Hx     Social History:  Social History   Socioeconomic History   Marital status: Married    Spouse name: Not on file   Number of children: Not on file    Years of education: Not on file   Highest education level: Not on file  Occupational History   Not on file  Tobacco Use   Smoking status: Never   Smokeless tobacco: Never  Substance and Sexual Activity   Alcohol use: Not on file   Drug use: Not on file   Sexual activity: Not on file  Other Topics Concern   Not on file  Social History Narrative   Not on file   Social Determinants of Health   Financial Resource Strain: Not on file  Food Insecurity: Not on file  Transportation Needs: Not on file  Physical Activity: Not on file  Stress: Not on file  Social Connections: Not on file  Intimate Partner Violence: Not on file    Diet: low Na diet  Exercise: active outside when its warmer, walks in stores  Home BP readings: 133/59, 107/47, 140/62, 145/64, 127/52, 121/62, 130/58, 111/58, 127/64, 126/66, 126/65, 126/65, 122/62, 135/65 HR 55-78 mainly 60's  Wt Readings from Last 3 Encounters:  04/06/21 174 lb 12.8 oz (79.3 kg)  03/15/21 178 lb 12.8 oz (81.1 kg)  02/24/21 178 lb 9.6 oz (81 kg)   BP Readings from Last 3 Encounters:  04/06/21 140/70  03/15/21 (!) 144/72  02/24/21 138/60   Pulse Readings from Last 3 Encounters:  04/06/21  63  03/15/21 (!) 44  02/24/21 60    Renal function: CrCl cannot be calculated (Patient's most recent lab result is older than the maximum 21 days allowed.).  Past Medical History:  Diagnosis Date   COVID-19    received Mab 01/2020   Diabetes mellitus without complication (HCC)    Heart murmur    Hypertension    Mild aortic stenosis     Current Outpatient Medications on File Prior to Visit  Medication Sig Dispense Refill   acetaminophen (TYLENOL) 500 MG tablet Take 2 tablets (1,000 mg total) by mouth every 6 (six) hours as needed. 30 tablet 0   ALPRAZolam (XANAX) 0.25 MG tablet Take 0.25 mg by mouth daily as needed for anxiety.     aspirin 81 MG EC tablet Take 1 tablet (81 mg total) by mouth daily. (Patient taking differently: Take 81  mg by mouth daily. HOLD FOR 4 WEEKS DUE TO HEMATOMA AT ICD SITE)     candesartan (ATACAND) 32 MG tablet Take 1 tablet (32 mg total) by mouth daily. 90 tablet 3   cetirizine (ZYRTEC) 10 MG tablet Take 10 mg by mouth daily.     diphenhydrAMINE (BENADRYL) 25 mg capsule Take 25 mg by mouth every 6 (six) hours as needed for allergies or sleep.     empagliflozin (JARDIANCE) 10 MG TABS tablet Take 1 tablet (10 mg total) by mouth daily before breakfast. 30 tablet 11   ezetimibe (ZETIA) 10 MG tablet Take 1 tablet (10 mg total) by mouth daily. 90 tablet 3   ferrous sulfate 325 (65 FE) MG tablet Take 325 mg by mouth daily with breakfast.     furosemide (LASIX) 40 MG tablet Take 0.5 tablets (20 mg total) by mouth daily. 45 tablet 3   metFORMIN (GLUCOPHAGE) 500 MG tablet Take 500 mg by mouth 2 (two) times daily with a meal.     metoprolol succinate (TOPROL-XL) 50 MG 24 hr tablet Take 1 tablet (50 mg total) by mouth 2 (two) times daily. Take with or immediately following a meal. 180 tablet 3   Multiple Vitamin (MULTI VITAMIN DAILY PO) Take 1 tablet by mouth daily.     Multiple Vitamins-Minerals (PRESERVISION AREDS 2) CAPS Take 1 capsule by mouth 2 (two) times daily.     rosuvastatin (CRESTOR) 20 MG tablet Take 1 tablet (20 mg total) by mouth daily. 90 tablet 3   sertraline (ZOLOFT) 50 MG tablet Take 50 mg by mouth in the morning.     spironolactone (ALDACTONE) 25 MG tablet Take 1 tablet (25 mg total) by mouth daily. 90 tablet 3   No current facility-administered medications on file prior to visit.    Allergies  Allergen Reactions   Entresto [Sacubitril-Valsartan] Rash     Assessment/Plan:  1. CHF - BP of 97/53 is a little low. Doesn't seem to correlate with home readings. No symptoms. I have asked patient to bring his BP cuff with him to his appointment with Dr. Burt Knack to make sure it is accurate. Patient is on all 4 pillars of HF medications. He had a rash to [facility].  Plan to speak with Dr. Lovena Le  prior to increasing metoprolol succinate to confirm this is ok to do with his current HR and ICD/pacer device. Although with current BP, would not make any changes right now. F/U with Dr. Burt Knack in 2 weeks. F/U with me as needed.   Ramond Dial, Pharm.D, BCPS, CPP [place]  [address],  [place], [place] [address]  Phone: [phone]; Fax: [phone]

## 2021-04-25 ENCOUNTER — Telehealth: Payer: Self-pay | Admitting: Pharmacist

## 2021-04-25 NOTE — Telephone Encounter (Signed)
Called and spoke with patients wife- per DPR. Labs are stable. Continue current medications.

## 2021-04-26 ENCOUNTER — Telehealth: Payer: Self-pay | Admitting: Pharmacist

## 2021-04-26 NOTE — Telephone Encounter (Signed)
Patient made aware that we will keep metoprolol at the same dose.

## 2021-04-26 NOTE — Telephone Encounter (Signed)
-----   Message from Evans Lance, MD sent at 04/25/2021 10:23 PM EST ----- No we do not want to pace. Titrate bp to what is tolerated. GT ----- Message ----- From: Ramond Dial, RPH-CPP Sent: 04/21/2021   3:34 PM EST To: Evans Lance, MD  Is this a case where we want him to be paced and should push the metoprolol dose?

## 2021-05-03 ENCOUNTER — Ambulatory Visit: Payer: Medicare Other | Admitting: Cardiovascular Disease

## 2021-05-04 ENCOUNTER — Encounter: Payer: Self-pay | Admitting: Cardiovascular Disease

## 2021-05-04 ENCOUNTER — Ambulatory Visit (INDEPENDENT_AMBULATORY_CARE_PROVIDER_SITE_OTHER): Payer: Medicare Other | Admitting: Cardiovascular Disease

## 2021-05-04 ENCOUNTER — Other Ambulatory Visit: Payer: Self-pay

## 2021-05-04 VITALS — BP 140/76 | HR 67 | Ht 68.0 in | Wt 182.0 lb

## 2021-05-04 DIAGNOSIS — I2510 Atherosclerotic heart disease of native coronary artery without angina pectoris: Secondary | ICD-10-CM | POA: Diagnosis not present

## 2021-05-04 DIAGNOSIS — I5022 Chronic systolic (congestive) heart failure: Secondary | ICD-10-CM | POA: Diagnosis not present

## 2021-05-04 DIAGNOSIS — I350 Nonrheumatic aortic (valve) stenosis: Secondary | ICD-10-CM

## 2021-05-04 NOTE — Progress Notes (Signed)
Cardiology Office Note:    Date:  05/04/2021   ID:  Brian Robinson, DOB 17-Dec-1945, MRN [id number]  PCP:  Seward Carol, MD   [facility] Providers Cardiologist:  Shelva Majestic, MD     Referring MD: Seward Carol, MD   Chief Complaint  Patient presents with   Congestive Heart Failure    History of Present Illness:    Brian Robinson is a 76 y.o. male with a hx of coronary artery disease complicated by out-of-hospital cardiac arrest at the time of his initial presentation.  He ultimately was treated with multivessel CABG.  The patient was noted to have severe calcification and restricted appearance of his aortic valve leaflets.  However, echo assessment and cardiac catheterization invasive gradient assessment was consistent with only mild aortic stenosis.  He was seen back in the office November 2022 for follow-up evaluation.  At that time he was noted to have functional class III symptoms.  His follow-up echocardiogram showed peak and mean transaortic gradients of only 19 and 11 mmHg, respectively.  However his stroke-volume index was low at 24 and his aortic valve dimensionless index was 0.25.  Cardiac catheterization demonstrated a very low gradient across his aortic valve, but he was noted to have hemodynamics consistent with elevated filling pressures and congestive heart failure.  He was given a single dose of IV diuretic at the time of cardiac catheterization and was started on oral loop diuretic.  He was set up for close follow-up in the Pharm.D. CHF clinic  The patient is here with his wife today.  He is doing remarkably well with titration of his medical therapy.  States that he has not felt this well in a long time.  No edema, shortness of breath, orthopnea, PND, or chest pain.  Recent labs showed normal renal function and normal electrolytes.  Past Medical History:  Diagnosis Date   COVID-19    received Mab 01/2020   Diabetes mellitus without complication ([place])    Heart murmur     Hypertension    Mild aortic stenosis     Past Surgical History:  Procedure Laterality Date   CORONARY ARTERY BYPASS GRAFT N/A 09/23/2020   Procedure: CORONARY ARTERY BYPASS GRAFTING (CABG), ON PUMP, TIMES THREE, USING LEFT INTERNAL MAMMARY ARTERY AND LEFT ENDOSCOPICALLY HARVESTED GREATER SAPHENOUS VEIN;  Surgeon: [name], MD;  Location: [place];  Service: Open Heart Surgery;  Laterality: N/A;   ENDOVEIN HARVEST OF GREATER SAPHENOUS VEIN Left 09/23/2020   Procedure: ENDOVEIN HARVEST OF GREATER SAPHENOUS VEIN;  Surgeon: [name], MD;  Location: [place];  Service: Open Heart Surgery;  Laterality: Left;   ICD IMPLANT N/A 02/21/2021   Procedure: ICD IMPLANT;  Surgeon: Evans Lance, MD;  Location: [place] [facility];  Service: Cardiovascular;  Laterality: N/A;   LEFT HEART CATH AND CORONARY ANGIOGRAPHY N/A 09/20/2020   Procedure: LEFT HEART CATH AND CORONARY ANGIOGRAPHY;  Surgeon: Nelva Bush, MD;  Location: [place] [facility];  Service: Cardiovascular;  Laterality: N/A;   RIGHT/LEFT HEART CATH AND CORONARY/GRAFT ANGIOGRAPHY N/A 02/07/2021   Procedure: RIGHT/LEFT HEART CATH AND CORONARY/GRAFT ANGIOGRAPHY;  Surgeon: Sherren Mocha, MD;  Location: [place] [facility];  Service: Cardiovascular;  Laterality: N/A;   TEE WITHOUT CARDIOVERSION N/A 09/23/2020   Procedure: TRANSESOPHAGEAL ECHOCARDIOGRAM (TEE);  Surgeon: [name], MD;  Location: [place];  Service: Open Heart Surgery;  Laterality: N/A;    Current Medications: Current Meds  Medication Sig   acetaminophen (TYLENOL) 500 MG tablet  Take 2 tablets (1,000 mg total) by mouth every 6 (six) hours as needed.   ALPRAZolam (XANAX) 0.25 MG tablet Take 0.25 mg by mouth daily as needed for anxiety.   aspirin 81 MG EC tablet Take 1 tablet (81 mg total) by mouth daily. (Patient taking differently: Take 81 mg by mouth daily. HOLD FOR 4 WEEKS DUE TO HEMATOMA AT ICD SITE)   candesartan (ATACAND) 32 MG tablet Take 1  tablet (32 mg total) by mouth daily.   cetirizine (ZYRTEC) 10 MG tablet Take 10 mg by mouth as needed.   diphenhydrAMINE (BENADRYL) 25 mg capsule Take 25 mg by mouth every 6 (six) hours as needed for allergies or sleep.   empagliflozin (JARDIANCE) 10 MG TABS tablet Take 1 tablet (10 mg total) by mouth daily before breakfast.   ezetimibe (ZETIA) 10 MG tablet Take 1 tablet (10 mg total) by mouth daily.   ferrous sulfate 325 (65 FE) MG tablet Take 325 mg by mouth daily with breakfast.   furosemide (LASIX) 40 MG tablet Take 0.5 tablets (20 mg total) by mouth daily.   metFORMIN (GLUCOPHAGE) 500 MG tablet Take 500 mg by mouth 2 (two) times daily with a meal.   metoprolol succinate (TOPROL-XL) 50 MG 24 hr tablet Take 1 tablet (50 mg total) by mouth 2 (two) times daily. Take with or immediately following a meal.   Multiple Vitamin (MULTI VITAMIN DAILY PO) Take 1 tablet by mouth daily.   Multiple Vitamins-Minerals (PRESERVISION AREDS 2) CAPS Take 1 capsule by mouth 2 (two) times daily.   rosuvastatin (CRESTOR) 20 MG tablet Take 1 tablet (20 mg total) by mouth daily.   sertraline (ZOLOFT) 50 MG tablet Take 50 mg by mouth in the morning.   spironolactone (ALDACTONE) 25 MG tablet Take 1 tablet (25 mg total) by mouth daily.     Allergies:   Entresto [sacubitril-valsartan]   Social History   Socioeconomic History   Marital status: Married    Spouse name: Not on file   Number of children: Not on file   Years of education: Not on file   Highest education level: Not on file  Occupational History   Not on file  Tobacco Use   Smoking status: Never   Smokeless tobacco: Never  Substance and Sexual Activity   Alcohol use: Not on file   Drug use: Not on file   Sexual activity: Not on file  Other Topics Concern   Not on file  Social History Narrative   Not on file   Social Determinants of Health   Financial Resource Strain: Not on file  Food Insecurity: Not on file  Transportation Needs: Not on  file  Physical Activity: Not on file  Stress: Not on file  Social Connections: Not on file     Family History: The patient's family history includes Cancer in his father and mother. There is no history of CAD.  ROS:   Please see the history of present illness.    All other systems reviewed and are negative.  EKGs/Labs/Other Studies Reviewed:    The following studies were reviewed today: Cardiac Cath 02/07/2021: Conclusion      Ost LAD to Prox LAD lesion is 75% stenosed.   Prox LAD lesion is 100% stenosed.   Prox Cx lesion is 40% stenosed.   Ost RCA lesion is 75% stenosed.   Dist RCA lesion is 90% stenosed.   Ramus lesion is 75% stenosed.   1st Diag-1 lesion is 75% stenosed.  3rd Mrg lesion is 30% stenosed.   Mid LAD to Dist LAD lesion is 75% stenosed.   1st Diag-2 lesion is 80% stenosed.   SVG and is normal in caliber.   LIMA graft was visualized by angiography and is normal in caliber.   The graft exhibits no disease.   The graft exhibits no disease.   1.  Severe native vessel coronary artery disease with chronic total occlusion of the LAD and severe stenosis of the RCA, unchanged from the previous study 2.  Status post CABG with continued patency of the LIMA to LAD, saphenous vein graft to diagonal, and saphenous vein graft to PDA 3.  Diffuse coronary artery disease with moderate severe stenoses in the diagonal and distal LAD systems 4.  Minimal transaortic valve gradient with peak to peak gradient only 6 mmHg on pullback.  Aortic valve calculations are not done because of the lack of significant gradient. 5.  Acute on chronic systolic heart failure with pulmonary hypertension and elevated left and right heart pressures throughout.  Transpulmonary gradient is 25 mmHg, PVR approximately 6 Wood units   Recommend: Aggressive treatment of heart failure with diuresis and titration of medical therapy.  IV furosemide given post procedure.  Will start on furosemide 40 mg daily in  addition to his current treatment.  We will arrange close follow-up to optimize guideline directed medical therapy for HFrEF.   Echo 12/27/20:  1. Severe ischemic DCM post CABG global hypokinesis worse in septum and  apex . Left ventricular ejection fraction, by estimation, is 25 to 30%.  The left ventricle has severely decreased function. The left ventricle  demonstrates global hypokinesis. Left  ventricular diastolic parameters are indeterminate.   2. Right ventricular systolic function is normal. The right ventricular  size is normal. There is mildly elevated pulmonary artery systolic  pressure.   3. Left atrial size was mildly dilated.   4. The mitral valve is degenerative. Trivial mitral valve regurgitation.  No evidence of mitral stenosis. Moderate mitral annular calcification.   5. Morphologically patient has significant AS Limited leaflet motion  severely calcified DVI 0.25 AVA 1 cm2 Suspect he would benefit from TAVR  Dr Burt Knack has reviewed case before At cath prior to CABG peak to peak  gradient only 10-15 mHg S Suggest  structural heart team review again . The aortic valve is calcified. There  is severe calcifcation of the aortic valve. There is severe thickening of  the aortic valve. Aortic valve regurgitation is not visualized. Moderate  to severe aortic valve stenosis.   6. The inferior vena cava is normal in size with greater than 50%  respiratory variability, suggesting right atrial pressure of 3 mmHg.  Recent Labs: 11/08/2020: Magnesium 1.7 01/10/2021: TSH 2.740 01/28/2021: Platelets 209 02/07/2021: Hemoglobin 12.2 03/24/2021: ALT 11 04/21/2021: BUN 23; Creatinine, Ser 1.13; Potassium 4.6; Sodium 139  Recent Lipid Panel    Component Value Date/Time   CHOL 123 03/24/2021 0923   TRIG 85 03/24/2021 0923   HDL 41 03/24/2021 0923   CHOLHDL 3.0 03/24/2021 0923   CHOLHDL 3.5 09/18/2020 0023   VLDL 19 09/18/2020 0023   LDLCALC 65 03/24/2021 0923     Risk  Assessment/Calculations:           Physical Exam:    VS:  BP 140/76 Comment: Home meter omron BP 150/69   Pulse 67    Ht 5\' 8"  (1.727 m)    Wt 182 lb (82.6 kg)    BMI 27.67 kg/m  Wt Readings from Last 3 Encounters:  05/04/21 182 lb (82.6 kg)  04/21/21 177 lb (80.3 kg)  04/06/21 174 lb 12.8 oz (79.3 kg)     GEN:  Well nourished, well developed in no acute distress HEENT: Normal NECK: No JVD; No carotid bruits LYMPHATICS: No lymphadenopathy CARDIAC: RRR, right 2/6 harsh crescendo decrescendo murmur at the right upper sternal border RESPIRATORY:  Clear to auscultation without rales, wheezing or rhonchi  ABDOMEN: Soft, non-tender, non-distended MUSCULOSKELETAL:  No edema; No deformity  SKIN: Warm and dry NEUROLOGIC:  Alert and oriented x 3 PSYCHIATRIC:  Normal affect   ASSESSMENT:    1. Chronic systolic heart failure ([place])   2. Nonrheumatic aortic valve stenosis   3. Coronary artery disease involving native coronary artery of native heart without angina pectoris    PLAN:    In order of problems listed above:  The patient has had a remarkable turnaround on guideline directed medical therapy with spironolactone, metoprolol succinate, Jardiance, and candesartan.  He is allergic to [facility].  He now has New York Heart Association functional class I symptoms.  He has undergone ICD implantation.  I would like him to return in 6 months for a repeat echocardiogram and follow-up office visit.  He will have labs drawn again in 3 months. The patient appears to have heavy calcification and restriction of his aortic valve leaflets, but hemodynamics have only been consistent with mild aortic stenosis with a minimal gradient across the valve measured by cardiac catheterization.  With improvement in his symptoms on medical therapy, plan to repeat an echocardiogram in 6 months as above. Status post CABG.  Patent bypass grafts noted at recent cardiac catheterization.  Continue medical  therapy.           Medication Adjustments/Labs and Tests Ordered: Current medicines are reviewed at length with the patient today.  Concerns regarding medicines are outlined above.  Orders Placed This Encounter  Procedures   Basic metabolic panel   ECHOCARDIOGRAM COMPLETE   No orders of the defined types were placed in this encounter.   Patient Instructions  Medication Instructions:  Your physician recommends that you continue on your current medications as directed. Please refer to the Current Medication list given to you today.  *If you need a refill on your cardiac medications before your next appointment, please call your pharmacy*   Lab Work: BMET in 3 months If you have labs (blood work) drawn today and your tests are completely normal, you will receive your results only by: [place] (if you have MyChart) OR A paper copy in the mail If you have any lab test that is abnormal or we need to change your treatment, we will call you to review the results.   Testing/Procedures: ECHO and same day appt in 6 monts   Follow-Up: At [facility], you and your health needs are our priority.  As part of our continuing mission to provide you with exceptional heart care, we have created designated Provider Care Teams.  These Care Teams include your primary Cardiologist (physician) and Advanced Practice Providers (APPs -  Physician Assistants and Nurse Practitioners) who all work together to provide you with the care you need, when you need it.   Your next appointment:   6 month(s)  The format for your next appointment:   In Person  Provider:   Sherren Mocha  Thank you for allowing [place] at [facility] to serve you, God Bless!!    Signed, Sherren Mocha,  MD  05/04/2021 2:48 PM    [place]

## 2021-05-04 NOTE — Patient Instructions (Signed)
Medication Instructions:  Your physician recommends that you continue on your current medications as directed. Please refer to the Current Medication list given to you today.  *If you need a refill on your cardiac medications before your next appointment, please call your pharmacy*   Lab Work: BMET in 3 months If you have labs (blood work) drawn today and your tests are completely normal, you will receive your results only by: [place] (if you have MyChart) OR A paper copy in the mail If you have any lab test that is abnormal or we need to change your treatment, we will call you to review the results.   Testing/Procedures: ECHO and same day appt in 6 monts   Follow-Up: At [facility], you and your health needs are our priority.  As part of our continuing mission to provide you with exceptional heart care, we have created designated Provider Care Teams.  These Care Teams include your primary Cardiologist (physician) and Advanced Practice Providers (APPs -  Physician Assistants and Nurse Practitioners) who all work together to provide you with the care you need, when you need it.   Your next appointment:   6 month(s)  The format for your next appointment:   In Person  Provider:   Sherren Mocha  Thank you for allowing [place] at [facility] to serve you, God Bless!!

## 2021-05-05 ENCOUNTER — Other Ambulatory Visit: Payer: Self-pay | Admitting: Physician Assistant

## 2021-05-24 ENCOUNTER — Ambulatory Visit (INDEPENDENT_AMBULATORY_CARE_PROVIDER_SITE_OTHER): Payer: Medicare Other

## 2021-05-24 DIAGNOSIS — I469 Cardiac arrest, cause unspecified: Secondary | ICD-10-CM | POA: Diagnosis not present

## 2021-05-25 LAB — CUP PACEART REMOTE DEVICE CHECK
Battery Remaining Longevity: 135 mo
Battery Voltage: 3.14 V
Brady Statistic RV Percent Paced: 1.23 %
Date Time Interrogation Session: 20230228043722
HighPow Impedance: 57 Ohm
Implantable Lead Implant Date: 20221128
Implantable Lead Location: 753860
Implantable Lead Model: 6935
Implantable Pulse Generator Implant Date: 20221128
Lead Channel Impedance Value: 304 Ohm
Lead Channel Impedance Value: 399 Ohm
Lead Channel Pacing Threshold Amplitude: 0.625 V
Lead Channel Pacing Threshold Pulse Width: 0.4 ms
Lead Channel Sensing Intrinsic Amplitude: 17.75 mV
Lead Channel Sensing Intrinsic Amplitude: 17.75 mV
Lead Channel Setting Pacing Amplitude: 3.25 V
Lead Channel Setting Pacing Pulse Width: 0.4 ms
Lead Channel Setting Sensing Sensitivity: 0.3 mV

## 2021-05-27 ENCOUNTER — Encounter: Payer: Self-pay | Admitting: Internal Medicine

## 2021-05-27 ENCOUNTER — Ambulatory Visit (INDEPENDENT_AMBULATORY_CARE_PROVIDER_SITE_OTHER): Payer: Medicare Other | Admitting: Internal Medicine

## 2021-05-27 ENCOUNTER — Other Ambulatory Visit: Payer: Self-pay

## 2021-05-27 VITALS — BP 124/70 | HR 65 | Ht 68.0 in | Wt 186.6 lb

## 2021-05-27 DIAGNOSIS — I2510 Atherosclerotic heart disease of native coronary artery without angina pectoris: Secondary | ICD-10-CM

## 2021-05-27 DIAGNOSIS — Z95810 Presence of automatic (implantable) cardiac defibrillator: Secondary | ICD-10-CM | POA: Diagnosis not present

## 2021-05-27 DIAGNOSIS — I5022 Chronic systolic (congestive) heart failure: Secondary | ICD-10-CM

## 2021-05-27 DIAGNOSIS — I469 Cardiac arrest, cause unspecified: Secondary | ICD-10-CM | POA: Diagnosis not present

## 2021-05-27 NOTE — Patient Instructions (Signed)
Medication Instructions:  ?Your physician recommends that you continue on your current medications as directed. Please refer to the Current Medication list given to you today. ? ?Labwork: ?None ordered. ? ?Testing/Procedures: ?None ordered. ? ?Follow-Up: ?Your physician wants you to follow-up in: one year with [name], MD or one of the following Advanced Practice Providers on your designated Care Team:   ?Tommye Standard, PA-C ?Legrand Como "Jonni Sanger" Heppner, PA-C ? ?Remote monitoring is used to monitor your ICD from home. This monitoring reduces the number of office visits required to check your device to one time per year. It allows [place] to keep an eye on the functioning of your device to ensure it is working properly. You are scheduled for a device check from home on 08/23/2021. You may send your transmission at any time that day. If you have a wireless device, the transmission will be sent automatically. After your physician reviews your transmission, you will receive a postcard with your next transmission date. ? ?Any Other Special Instructions Will Be Listed Below (If Applicable). ? ?If you need a refill on your cardiac medications before your next appointment, please call your pharmacy.  ? ? ? ? ?

## 2021-05-27 NOTE — Progress Notes (Signed)
? ? ? ? ?HPI ?Mr. Brogden returns today for followup. He is a pleasant 76 yo man with a h/o CAD , s/p CABG, prior VF arrest, severe LV dysfunction despite revascularization. He has undergone ICD insertion and denies chest pain or sob. He has not had any ICD therapies. No syncope. He has minimal palpitations.  ?Allergies  ?Allergen Reactions  ? Entresto [Sacubitril-Valsartan] Rash  ? ? ? ?Current Outpatient Medications  ?Medication Sig Dispense Refill  ? acetaminophen (TYLENOL) 500 MG tablet Take 2 tablets (1,000 mg total) by mouth every 6 (six) hours as needed. 30 tablet 0  ? ALPRAZolam (XANAX) 0.25 MG tablet Take 0.25 mg by mouth daily as needed for anxiety.    ? aspirin 81 MG EC tablet Take 1 tablet (81 mg total) by mouth daily. (Patient taking differently: Take 81 mg by mouth daily. HOLD FOR 4 WEEKS DUE TO HEMATOMA AT ICD SITE)    ? candesartan (ATACAND) 32 MG tablet Take 1 tablet (32 mg total) by mouth daily. 90 tablet 3  ? cetirizine (ZYRTEC) 10 MG tablet Take 10 mg by mouth as needed.    ? diphenhydrAMINE (BENADRYL) 25 mg capsule Take 25 mg by mouth every 6 (six) hours as needed for allergies or sleep.    ? empagliflozin (JARDIANCE) 10 MG TABS tablet Take 1 tablet (10 mg total) by mouth daily before breakfast. 30 tablet 11  ? ezetimibe (ZETIA) 10 MG tablet Take 1 tablet (10 mg total) by mouth daily. 90 tablet 3  ? ferrous sulfate 325 (65 FE) MG tablet Take 325 mg by mouth daily with breakfast.    ? furosemide (LASIX) 40 MG tablet Take 0.5 tablets (20 mg total) by mouth daily. 45 tablet 3  ? metFORMIN (GLUCOPHAGE) 500 MG tablet Take 500 mg by mouth 2 (two) times daily with a meal.    ? metoprolol succinate (TOPROL-XL) 50 MG 24 hr tablet Take 1 tablet (50 mg total) by mouth 2 (two) times daily. Take with or immediately following a meal. 180 tablet 3  ? Multiple Vitamin (MULTI VITAMIN DAILY PO) Take 1 tablet by mouth daily.    ? Multiple Vitamins-Minerals (PRESERVISION AREDS 2) CAPS Take 1 capsule by mouth 2  (two) times daily.    ? rosuvastatin (CRESTOR) 20 MG tablet Take 1 tablet (20 mg total) by mouth daily. 90 tablet 3  ? sertraline (ZOLOFT) 50 MG tablet Take 50 mg by mouth in the morning.    ? spironolactone (ALDACTONE) 25 MG tablet Take 1 tablet (25 mg total) by mouth daily. 90 tablet 3  ? ?No current facility-administered medications for this visit.  ? ? ? ?Past Medical History:  ?Diagnosis Date  ? COVID-19   ? received Mab 01/2020  ? Diabetes mellitus without complication ([place])   ? Heart murmur   ? Hypertension   ? Mild aortic stenosis   ? ? ?ROS: ? ? All systems reviewed and negative except as noted in the HPI. ? ? ?Past Surgical History:  ?Procedure Laterality Date  ? CORONARY ARTERY BYPASS GRAFT N/A 09/23/2020  ? Procedure: CORONARY ARTERY BYPASS GRAFTING (CABG), ON PUMP, TIMES THREE, USING LEFT INTERNAL MAMMARY ARTERY AND LEFT ENDOSCOPICALLY HARVESTED GREATER SAPHENOUS VEIN;  Surgeon: [name], MD;  Location: [place];  Service: Open Heart Surgery;  Laterality: N/A;  ? ENDOVEIN HARVEST OF GREATER SAPHENOUS VEIN Left 09/23/2020  ? Procedure: ENDOVEIN HARVEST OF GREATER SAPHENOUS VEIN;  Surgeon: [name], MD;  Location: [place];  Service: Open  Heart Surgery;  Laterality: Left;  ? ICD IMPLANT N/A 02/21/2021  ? Procedure: ICD IMPLANT;  Surgeon: Evans Lance, MD;  Location: [place] [facility];  Service: Cardiovascular;  Laterality: N/A;  ? LEFT HEART CATH AND CORONARY ANGIOGRAPHY N/A 09/20/2020  ? Procedure: LEFT HEART CATH AND CORONARY ANGIOGRAPHY;  Surgeon: Nelva Bush, MD;  Location: [place] [facility];  Service: Cardiovascular;  Laterality: N/A;  ? RIGHT/LEFT HEART CATH AND CORONARY/GRAFT ANGIOGRAPHY N/A 02/07/2021  ? Procedure: RIGHT/LEFT HEART CATH AND CORONARY/GRAFT ANGIOGRAPHY;  Surgeon: Sherren Mocha, MD;  Location: [place] [facility];  Service: Cardiovascular;  Laterality: N/A;  ? TEE WITHOUT CARDIOVERSION N/A 09/23/2020  ? Procedure: TRANSESOPHAGEAL ECHOCARDIOGRAM (TEE);   Surgeon: [name], MD;  Location: [place];  Service: Open Heart Surgery;  Laterality: N/A;  ? ? ? ?Family History  ?Problem Relation Age of Onset  ? Cancer Mother   ? Cancer Father   ? CAD Neg Hx   ? ? ? ?Social History  ? ?Socioeconomic History  ? Marital status: Married  ?  Spouse name: Not on file  ? Number of children: Not on file  ? Years of education: Not on file  ? Highest education level: Not on file  ?Occupational History  ? Not on file  ?Tobacco Use  ? Smoking status: Never  ? Smokeless tobacco: Never  ?Substance and Sexual Activity  ? Alcohol use: Not on file  ? Drug use: Not on file  ? Sexual activity: Not on file  ?Other Topics Concern  ? Not on file  ?Social History Narrative  ? Not on file  ? ?Social Determinants of Health  ? ?Financial Resource Strain: Not on file  ?Food Insecurity: Not on file  ?Transportation Needs: Not on file  ?Physical Activity: Not on file  ?Stress: Not on file  ?Social Connections: Not on file  ?Intimate Partner Violence: Not on file  ? ? ? ?BP 124/70   Pulse 65   Ht 5\' 8"  (1.727 m)   Wt 186 lb 9.6 oz (84.6 kg)   SpO2 99%   BMI 28.37 kg/m?  ? ?Physical Exam: ? ?Well appearing 76 yo man, NAD ?HEENT: Unremarkable ?Neck:  No JVD, no thyromegally ?Lymphatics:  No adenopathy ?Back:  No CVA tenderness ?Lungs:  Clear with no wheezes ?HEART:  Regular rate rhythm, no murmurs, no rubs, no clicks ?Abd:  soft, positive bowel sounds, no organomegally, no rebound, no guarding ?Ext:  2 plus pulses, no edema, no cyanosis, no clubbing ?Skin:  No rashes no nodules ?Neuro:  CN II through XII intact, motor grossly intact ? ? ?DEVICE  ?Normal device function.  See PaceArt for details.  ? ?Assess/Plan:  ?VF  - he is s/p ICD and has not had any malignant ventricular arrhythmias. He will undergo watchful waiting. ?ICD - his medtronic single chamber ICD is working normally. ?CAD - he is s/p CABG and denies any anginal symptoms.  ?Chronic systolic heart failure - his symptoms are class  2. We will follow. ? ?Carleene Overlie Ronie Barnhart,MD ?

## 2021-05-31 NOTE — Addendum Note (Signed)
Addended by: Janan Halter F on: 05/31/2021 04:11 PM ? ? Modules accepted: Orders ? ?

## 2021-06-01 NOTE — Progress Notes (Signed)
Remote ICD transmission.   

## 2021-06-02 ENCOUNTER — Telehealth: Payer: Self-pay | Admitting: Cardiovascular Disease

## 2021-06-02 NOTE — Telephone Encounter (Signed)
See my chart response .

## 2021-06-02 NOTE — Telephone Encounter (Signed)
? ?  Pt c/o medication issue: ? ?1. Name of Medication: aleve  ? ?2. How are you currently taking this medication (dosage and times per day)?  ? ?3. Are you having a reaction (difficulty breathing--STAT)?  ? ?4. What is your medication issue? Pt's wife would like to ask if pt can take aleve for back pain ?

## 2021-06-14 ENCOUNTER — Encounter: Payer: Self-pay | Admitting: Cardiovascular Disease

## 2021-06-14 DIAGNOSIS — E119 Type 2 diabetes mellitus without complications: Secondary | ICD-10-CM | POA: Diagnosis not present

## 2021-06-14 DIAGNOSIS — I10 Essential (primary) hypertension: Secondary | ICD-10-CM | POA: Diagnosis not present

## 2021-06-14 DIAGNOSIS — E785 Hyperlipidemia, unspecified: Secondary | ICD-10-CM | POA: Diagnosis not present

## 2021-06-14 MED ORDER — SPIRONOLACTONE 25 MG PO TABS: 12.5000 mg | ORAL_TABLET | Freq: Every day | ORAL | 3 refills | Status: DC
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
## 2021-06-14 NOTE — Telephone Encounter (Signed)
Brian Mocha, MD  Brian Robinson 1 hour ago (1:40 PM)  ? ?He may be overmedicated. I recommend following up with his primary physician as it appears you are planning to do. In the interim, I would like him to stop furosemide completely. I'd like him to hold spironolactone for 3 days, then start back only on 1/2 tablet daily. I'll ask Judson Roch to reach out by phone to clarify these changes.  ?Thx - Dr Burt Knack ? ? ? ?Wife Ginger has already viewed message of Dr Burt Knack and knows changes to implement. Updated meds in chart (d/c lasix and decrease aldactone to 0.5 tab QD) ?

## 2021-06-16 DIAGNOSIS — I2510 Atherosclerotic heart disease of native coronary artery without angina pectoris: Secondary | ICD-10-CM | POA: Insufficient documentation

## 2021-06-16 DIAGNOSIS — I10 Essential (primary) hypertension: Secondary | ICD-10-CM | POA: Insufficient documentation

## 2021-06-16 DIAGNOSIS — E119 Type 2 diabetes mellitus without complications: Secondary | ICD-10-CM | POA: Insufficient documentation

## 2021-06-16 DIAGNOSIS — F419 Anxiety disorder, unspecified: Secondary | ICD-10-CM | POA: Insufficient documentation

## 2021-06-16 DIAGNOSIS — G4700 Insomnia, unspecified: Secondary | ICD-10-CM | POA: Insufficient documentation

## 2021-06-16 DIAGNOSIS — E785 Hyperlipidemia, unspecified: Secondary | ICD-10-CM | POA: Insufficient documentation

## 2021-06-16 MED ORDER — METOPROLOL SUCCINATE ER 25 MG PO TB24: 25.0000 mg | ORAL_TABLET | Freq: Two times a day (BID) | ORAL | 3 refills | Status: DC

## 2021-06-16 NOTE — Telephone Encounter (Signed)
OK to reduce toprol xl to 25 mg BID. He needs some follow-up in our office since we are making all of these changes. Could be with me, Nicki Reaper, or PharmD clinic, wherever there is availability.  ? ?Thx Judson Roch ?

## 2021-06-16 NOTE — Telephone Encounter (Signed)
Called and spoke to wife Ginger who understands that pt should decrease Metoprolol Succinate to '25mg'$  daily (1/2 tablet of current medication, twice daily). Wife states that she understands. She says pt has seen Melissa, Rph at our office several times and says will gladly see her again. Will route message to PharmD to schedule patient since Dr Burt Knack is booked through September and [place] [place] does not have availability now. Ginger provides following BP log: ?06/14/21 112/44 ?  92/51 ?  109/76   ?  116/46 ? ?06/15/21 109/59*states SOB and HR is 43 ?  111/51 ?  140/56 ? ?06/16/21 103/48 ?  105/48 ?  118/50 ?Wife states that pt's HR is staying in mid 40's and highest in last several days has been in the 47's. Informed that hopefully the decrease in Metoprolol should help raise this to remain in the 60's or higher. She also states she is trying to get pt to drink more water, but is unsure he actually is. ?

## 2021-06-16 NOTE — Addendum Note (Signed)
Addended by: Ma Hillock on: 06/16/2021 05:13 PM ? ? Modules accepted: Orders ? ?

## 2021-06-17 ENCOUNTER — Other Ambulatory Visit: Payer: Self-pay | Admitting: Pharmacist

## 2021-06-17 ENCOUNTER — Encounter: Payer: Self-pay | Admitting: Pharmacist

## 2021-06-30 NOTE — Progress Notes (Signed)
Patient ID: Brian Robinson                 DOB: [birth year]                      MRN: [id number] ? ? ? ? ?HPI: ?Brian Robinson is a 76 y.o. male referred by Dr. Oswald Robinson to pharmacy clinic for HF medication management. PMH is significant for CABGx3 (08/2020), VF arrest s/p ICD (01/2021), persistent LV dysfunction despite revascularization, DM and HTN. Most recent LVEF 25-30% on 12/27/20. ? ?On 02/24/21, he presented to pharmacy clinic for further medication titration. Spironolactone was increased from 12.5 mg to '25mg'$  daily and metoprolol was increased to '50mg'$  twice a day. He had a heart cath on 11/14 and an ICD placed 11/28. Activity level and appetite had been good. He checks his weight at home (normal range 173 - 175 lbs). He adheres to a low-salt diet. ? ?On 03/07/21, furosemide was decreased to '20mg'$  daily. At next clinic visit on 12/20, BP was above goal and losartan 100 mg daily was changed to candesartan 16 mg daily. Swelling was well controlled. Home BP was high in the AM before medications, but had been coming down by the afternoon, but still high in the 413'K systolic. Candesartan was increased to target dose of '32mg'$  daily. ? ?Pt followed up in PharmD clinic 04/21/21 where his BP was borderline low (97/53) but didn't correlate with home readings. Pt was tolerating his 4 GDMT medications and no changes were made. At his February and March clinic visits, patient endorsed improved symptoms (NYHA Class I-II).  ? ?Wife reached out 06/14/21 stating patient's BP was low (112/44, 92/51) and Dr. Burt Robinson instructed him to stop furosemide completely, hold spironolactone for 3 days and restart at lower dose of 12.5 mg daily. Three days later, the wife messaged the office stating the patient had some SOB/DOE and bradycardia (HR 43-53). Metoprolol succinate was reduced to 25 mg BID, and follow-up was scheduled with PharmD to assess clinical status after multiple medication changes. Candesartan was also reduced to '16mg'$   daily, but subsequently increased back to '32mg'$  daily due to systolic readings up to the 150s.  ? ?Today he returns to pharmacy clinic for further medication management. Symptomatically, he is feeling pretty good, some dizziness, lightheadedness that lasted just a few seconds. This has occurred a few times and was associated with change in position. No further issues with confusion or erratic behavior. No chest pain or palpitations. Feels SOB when bends over and stands up, but does not happen every time. Able to complete all ADLs. Activity level high (power washed friends porch). He checks his weight at home (normal range 179 - 182 lbs). No LEE, PND, or orthopnea. Appetite has been good.  ? ?Grandson shipping off to basic training today. Nervous for him. ? ?Brings in home cuff. Found to run high for systolic and low for diastolic ?Home BP cuff:118/65, 134/58, 131/66, 139/63 ?Clinc: 128/70, 120/70 ? ?Current CHF meds:  ?Candesartan 32 mg daily ?Jardiance 10 mg daily ?Spironolactone 12.5 mg daily ?Metoprolol succinate 25 mg BID ? ?Previously tried: Entresto (rash), losartan '100mg'$  daily (switch to candesartan) ? ?BP goal: <130/80 mmHg ? ?Family History:  ?Family History  ?Problem Relation Age of Onset  ? Cancer Mother   ? Cancer Father   ? CAD Neg Hx   ? ? ?Social History:  ?Social History  ? ?Socioeconomic History  ? Marital status: Married  ?  Spouse name: Not  on file  ? Number of children: Not on file  ? Years of education: Not on file  ? Highest education level: Not on file  ?Occupational History  ? Not on file  ?Tobacco Use  ? Smoking status: Never  ? Smokeless tobacco: Never  ?Substance and Sexual Activity  ? Alcohol use: Not on file  ? Drug use: Not on file  ? Sexual activity: Not on file  ?Other Topics Concern  ? Not on file  ?Social History Narrative  ? Not on file  ? ?Social Determinants of Health  ? ?Financial Resource Strain: Not on file  ?Food Insecurity: Not on file  ?Transportation Needs: Not on file   ?Physical Activity: Not on file  ?Stress: Not on file  ?Social Connections: Not on file  ?Intimate Partner Violence: Not on file  ? ? ?Home BP readings: 134/56, 141/63, 138/62, 104/55, 120/60, 115/56, 122/66, 134/64, 118/64, 118/52, 143/61, 135/57, 139/65, 143/69, 142/72, 133/61, 115/75, 130/62 ? ?Wt Readings from Last 3 Encounters:  ?05/27/21 186 lb 9.6 oz (84.6 kg)  ?05/04/21 182 lb (82.6 kg)  ?04/21/21 177 lb (80.3 kg)  ? ?BP Readings from Last 3 Encounters:  ?05/27/21 124/70  ?05/04/21 140/76  ?04/21/21 (!) 97/53  ? ?Pulse Readings from Last 3 Encounters:  ?05/27/21 65  ?05/04/21 67  ?04/21/21 64  ? ? ?Renal function: ?CrCl cannot be calculated (Patient's most recent lab result is older than the maximum 21 days allowed.). ? ?Past Medical History:  ?Diagnosis Date  ? COVID-19   ? received Mab 01/2020  ? Diabetes mellitus without complication ([place])   ? Heart murmur   ? Hypertension   ? Mild aortic stenosis   ? ? ?Current Outpatient Medications on File Prior to Visit  ?Medication Sig Dispense Refill  ? acetaminophen (TYLENOL) 500 MG tablet Take 2 tablets (1,000 mg total) by mouth every 6 (six) hours as needed. 30 tablet 0  ? ALPRAZolam (XANAX) 0.25 MG tablet Take 0.25 mg by mouth daily as needed for anxiety.    ? aspirin 81 MG EC tablet Take 1 tablet (81 mg total) by mouth daily. (Patient taking differently: Take 81 mg by mouth daily. HOLD FOR 4 WEEKS DUE TO HEMATOMA AT ICD SITE)    ? candesartan (ATACAND) 32 MG tablet Take 32 mg by mouth daily. 45 tablet 3  ? cetirizine (ZYRTEC) 10 MG tablet Take 10 mg by mouth as needed.    ? diphenhydrAMINE (BENADRYL) 25 mg capsule Take 25 mg by mouth every 6 (six) hours as needed for allergies or sleep.    ? empagliflozin (JARDIANCE) 10 MG TABS tablet Take 1 tablet (10 mg total) by mouth daily before breakfast. 30 tablet 11  ? ezetimibe (ZETIA) 10 MG tablet Take 1 tablet (10 mg total) by mouth daily. 90 tablet 3  ? ferrous sulfate 325 (65 FE) MG tablet Take 325 mg by mouth  daily with breakfast.    ? metFORMIN (GLUCOPHAGE) 500 MG tablet Take 500 mg by mouth 2 (two) times daily with a meal.    ? metoprolol succinate (TOPROL XL) 25 MG 24 hr tablet Take 1 tablet (25 mg total) by mouth 2 (two) times daily. 180 tablet 3  ? Multiple Vitamin (MULTI VITAMIN DAILY PO) Take 1 tablet by mouth daily.    ? Multiple Vitamins-Minerals (PRESERVISION AREDS 2) CAPS Take 1 capsule by mouth 2 (two) times daily.    ? rosuvastatin (CRESTOR) 20 MG tablet Take 1 tablet (20 mg total) by mouth daily.  90 tablet 3  ? sertraline (ZOLOFT) 50 MG tablet Take 50 mg by mouth in the morning.    ? spironolactone (ALDACTONE) 25 MG tablet Take 0.5 tablets (12.5 mg total) by mouth daily. 45 tablet 3  ? ?No current facility-administered medications on file prior to visit.  ? ? ?Allergies  ?Allergen Reactions  ? Entresto [Sacubitril-Valsartan] Rash  ? ? ? ?Assessment/Plan: ? ?1. CHF - BP of 120/70 mmHg is at goal and well controlled. Patient does have a few high systolic readings at home, but home cuff runs high for the systolic reading. He also had just a couple low readings. His cuff is about 77 years old. They do have a new one that [facility] sent them. I advised them to use the new one or purchase a new OMRON 3 series or bronze series.  Heart-rate is much improved since decreasing metoprolol. Weight is stable without furosemide and on lower dose to spironolactone.  ?Continue candesartan 32 mg daily, Jardiance 10 mg daily, spironolactone 12.5 mg daily and metoprolol succinate 25 mg BID. Patient to call if his weight increases 3 or more lb overnight or 5lb in a week.  ?F/U with me as needed. ? ? ?Laurey Arrow, PharmD ?PGY1 Pharmacy Resident ?[facility] [place], [place] [address]  ?Phone: [phone]; Fax: [phone] ?

## 2021-07-04 ENCOUNTER — Ambulatory Visit (INDEPENDENT_AMBULATORY_CARE_PROVIDER_SITE_OTHER): Payer: Medicare Other | Admitting: Pharmacist

## 2021-07-04 VITALS — HR 66 | Wt 188.0 lb

## 2021-07-04 DIAGNOSIS — I5040 Unspecified combined systolic (congestive) and diastolic (congestive) heart failure: Secondary | ICD-10-CM | POA: Diagnosis not present

## 2021-07-04 DIAGNOSIS — E1169 Type 2 diabetes mellitus with other specified complication: Secondary | ICD-10-CM | POA: Diagnosis not present

## 2021-07-04 DIAGNOSIS — I2510 Atherosclerotic heart disease of native coronary artery without angina pectoris: Secondary | ICD-10-CM | POA: Diagnosis not present

## 2021-07-04 DIAGNOSIS — I5022 Chronic systolic (congestive) heart failure: Secondary | ICD-10-CM | POA: Diagnosis not present

## 2021-07-04 DIAGNOSIS — I10 Essential (primary) hypertension: Secondary | ICD-10-CM | POA: Diagnosis not present

## 2021-07-04 DIAGNOSIS — I350 Nonrheumatic aortic (valve) stenosis: Secondary | ICD-10-CM | POA: Diagnosis not present

## 2021-07-04 DIAGNOSIS — E7800 Pure hypercholesterolemia, unspecified: Secondary | ICD-10-CM | POA: Diagnosis not present

## 2021-07-04 DIAGNOSIS — Z95810 Presence of automatic (implantable) cardiac defibrillator: Secondary | ICD-10-CM | POA: Diagnosis not present

## 2021-07-04 NOTE — Addendum Note (Signed)
Addended by: Marcelle Overlie D on: 07/04/2021 03:37 PM ? ? Modules accepted: Orders ? ?

## 2021-07-04 NOTE — Patient Instructions (Signed)
Call us if your weight increases 3 or more lb overnight or 5lb in a week ?Continue to monitor weight and blood pressure at home ?Contact us with any concerns ?

## 2021-07-11 DIAGNOSIS — I10 Essential (primary) hypertension: Secondary | ICD-10-CM | POA: Diagnosis not present

## 2021-08-01 ENCOUNTER — Other Ambulatory Visit: Payer: Medicare Other | Admitting: *Deleted

## 2021-08-01 DIAGNOSIS — I350 Nonrheumatic aortic (valve) stenosis: Secondary | ICD-10-CM

## 2021-08-01 DIAGNOSIS — I5022 Chronic systolic (congestive) heart failure: Secondary | ICD-10-CM

## 2021-08-01 DIAGNOSIS — I2510 Atherosclerotic heart disease of native coronary artery without angina pectoris: Secondary | ICD-10-CM

## 2021-08-01 LAB — BASIC METABOLIC PANEL
BUN/Creatinine Ratio: 22 (ref 10–24)
BUN: 23 mg/dL (ref 8–27)
CO2: 21 mmol/L (ref 20–29)
Calcium: 9.3 mg/dL (ref 8.6–10.2)
Chloride: 103 mmol/L (ref 96–106)
Creatinine, Ser: 1.04 mg/dL (ref 0.76–1.27)
Glucose: 93 mg/dL (ref 70–99)
Potassium: 4.6 mmol/L (ref 3.5–5.2)
Sodium: 135 mmol/L (ref 134–144)
eGFR: 74 mL/min/{1.73_m2} (ref >59)

## 2021-08-08 DIAGNOSIS — H608X3 Other otitis externa, bilateral: Secondary | ICD-10-CM | POA: Diagnosis not present

## 2021-08-08 DIAGNOSIS — H6122 Impacted cerumen, left ear: Secondary | ICD-10-CM | POA: Diagnosis not present

## 2021-08-08 DIAGNOSIS — H938X1 Other specified disorders of right ear: Secondary | ICD-10-CM | POA: Diagnosis not present

## 2021-08-08 DIAGNOSIS — H903 Sensorineural hearing loss, bilateral: Secondary | ICD-10-CM | POA: Diagnosis not present

## 2021-08-18 DIAGNOSIS — I10 Essential (primary) hypertension: Secondary | ICD-10-CM | POA: Diagnosis not present

## 2021-08-18 DIAGNOSIS — E1169 Type 2 diabetes mellitus with other specified complication: Secondary | ICD-10-CM | POA: Diagnosis not present

## 2021-08-18 DIAGNOSIS — E785 Hyperlipidemia, unspecified: Secondary | ICD-10-CM | POA: Diagnosis not present

## 2021-08-18 DIAGNOSIS — I5040 Unspecified combined systolic (congestive) and diastolic (congestive) heart failure: Secondary | ICD-10-CM | POA: Diagnosis not present

## 2021-08-23 ENCOUNTER — Ambulatory Visit (INDEPENDENT_AMBULATORY_CARE_PROVIDER_SITE_OTHER): Payer: Medicare Other

## 2021-08-23 DIAGNOSIS — I5022 Chronic systolic (congestive) heart failure: Secondary | ICD-10-CM | POA: Diagnosis not present

## 2021-08-23 LAB — CUP PACEART REMOTE DEVICE CHECK
Battery Remaining Longevity: 134 mo
Battery Voltage: 3.08 V
Brady Statistic RV Percent Paced: 0.31 %
Date Time Interrogation Session: 20230530022725
HighPow Impedance: 55 Ohm
Implantable Lead Implant Date: 20221128
Implantable Lead Location: 753860
Implantable Lead Model: 6935
Implantable Pulse Generator Implant Date: 20221128
Lead Channel Impedance Value: 342 Ohm
Lead Channel Impedance Value: 361 Ohm
Lead Channel Pacing Threshold Amplitude: 0.625 V
Lead Channel Pacing Threshold Pulse Width: 0.4 ms
Lead Channel Sensing Intrinsic Amplitude: 17 mV
Lead Channel Sensing Intrinsic Amplitude: 17 mV
Lead Channel Setting Pacing Amplitude: 2 V
Lead Channel Setting Pacing Pulse Width: 0.4 ms
Lead Channel Setting Sensing Sensitivity: 0.3 mV

## 2021-09-06 ENCOUNTER — Encounter: Payer: Self-pay | Admitting: Pharmacist

## 2021-09-07 ENCOUNTER — Telehealth: Payer: Self-pay | Admitting: Cardiovascular Disease

## 2021-09-07 NOTE — Telephone Encounter (Signed)
Wife is following-up to make sure Brian Robinson received her MyChart message.

## 2021-09-07 NOTE — Telephone Encounter (Signed)
See my chart encounter.

## 2021-09-07 NOTE — Progress Notes (Signed)
Remote ICD transmission.   

## 2021-09-12 NOTE — Telephone Encounter (Signed)
Thanks for the message. I would just keep him off of metoprolol and follow his heart rate, especially considering how low of a dose he was on I suspect he isn't getting much benefit from the drug.

## 2021-11-02 ENCOUNTER — Ambulatory Visit (INDEPENDENT_AMBULATORY_CARE_PROVIDER_SITE_OTHER): Payer: Medicare Other | Admitting: Cardiovascular Disease

## 2021-11-02 ENCOUNTER — Encounter: Payer: Self-pay | Admitting: Cardiovascular Disease

## 2021-11-02 ENCOUNTER — Ambulatory Visit (HOSPITAL_COMMUNITY): Payer: Medicare Other | Attending: Cardiology

## 2021-11-02 VITALS — BP 118/68 | HR 49 | Ht 69.0 in | Wt 193.4 lb

## 2021-11-02 DIAGNOSIS — I350 Nonrheumatic aortic (valve) stenosis: Secondary | ICD-10-CM

## 2021-11-02 DIAGNOSIS — I5022 Chronic systolic (congestive) heart failure: Secondary | ICD-10-CM

## 2021-11-02 DIAGNOSIS — I2510 Atherosclerotic heart disease of native coronary artery without angina pectoris: Secondary | ICD-10-CM | POA: Insufficient documentation

## 2021-11-02 DIAGNOSIS — I10 Essential (primary) hypertension: Secondary | ICD-10-CM

## 2021-11-02 DIAGNOSIS — E782 Mixed hyperlipidemia: Secondary | ICD-10-CM | POA: Diagnosis not present

## 2021-11-02 DIAGNOSIS — E118 Type 2 diabetes mellitus with unspecified complications: Secondary | ICD-10-CM | POA: Diagnosis not present

## 2021-11-02 LAB — ECHOCARDIOGRAM COMPLETE
AR max vel: 1.25 cm2
AV Area VTI: 0.99 cm2
AV Area mean vel: 1.2 cm2
AV Mean grad: 24 mmHg
AV Peak grad: 27 mmHg
Ao pk vel: 2.6 m/s
Area-P 1/2: 3.42 cm2
S' Lateral: 3.5 cm

## 2021-11-02 MED ORDER — PERFLUTREN LIPID MICROSPHERE
1.0000 mL | INTRAVENOUS | Status: AC | PRN
  Administered 2021-11-02: 1 mL via INTRAVENOUS

## 2021-11-02 NOTE — Progress Notes (Signed)
Cardiology Office Note:    Date:  11/02/2021   ID:  Brian Robinson, DOB [birth year], MRN [id number]  PCP:  Seward Carol, [place] [facility] Cardiologist:  Shelva Majestic, MD     Referring MD: Seward Carol, MD   Chief Complaint  Patient presents with   Coronary Artery Disease    History of Present Illness:    Brian Cangelosi is a 76 y.o. male with a hx of coronary artery disease complicated by out-of-hospital cardiac arrest at the time of his initial presentation.  He ultimately was treated with multivessel CABG.  The patient was noted to have severe calcification and restricted appearance of his aortic valve leaflets.  However, echo assessment and cardiac catheterization invasive gradient assessment was consistent with only mild aortic stenosis.  He was seen back in the office November 2022 for follow-up evaluation.  At that time he was noted to have functional class III symptoms.  His follow-up echocardiogram showed peak and mean transaortic gradients of only 19 and 11 mmHg, respectively.  However his stroke-volume index was low at 24 and his aortic valve dimensionless index was 0.25.  Cardiac catheterization demonstrated a very low gradient across his aortic valve, but he was noted to have hemodynamics consistent with elevated filling pressures and congestive heart failure.  He was given a single dose of IV diuretic at the time of cardiac catheterization and was started on oral loop diuretic.  He has been followed in our Pharm.D. clinic for titration of heart failure therapies.  The patient is here with his wife today.  He had a 2D echocardiogram this morning to follow-up his aortic valve disease and LV dysfunction.  He feels very well.  He is doing well on his current medications.  His beta-blocker was stopped due to bradycardia and fatigue with both of these things improving since discontinuation of beta-blockade.  He denies chest pain, chest pressure, or shortness of breath.   He is physically active doing work around his house with no functional limitation.  He brings in home blood pressure readings that are in a very good range with average blood pressures less than 120/65 mmHg on my review.  His weights are also stable.  Past Medical History:  Diagnosis Date   COVID-19    received Mab 01/2020   Diabetes mellitus without complication ([place])    Heart murmur    Hypertension    Mild aortic stenosis     Past Surgical History:  Procedure Laterality Date   CORONARY ARTERY BYPASS GRAFT N/A 09/23/2020   Procedure: CORONARY ARTERY BYPASS GRAFTING (CABG), ON PUMP, TIMES THREE, USING LEFT INTERNAL MAMMARY ARTERY AND LEFT ENDOSCOPICALLY HARVESTED GREATER SAPHENOUS VEIN;  Surgeon: [name], MD;  Location: [facility];  Service: Open Heart Surgery;  Laterality: N/A;   ENDOVEIN HARVEST OF GREATER SAPHENOUS VEIN Left 09/23/2020   Procedure: ENDOVEIN HARVEST OF GREATER SAPHENOUS VEIN;  Surgeon: [name], MD;  Location: [facility];  Service: Open Heart Surgery;  Laterality: Left;   ICD IMPLANT N/A 02/21/2021   Procedure: ICD IMPLANT;  Surgeon: Evans Lance, MD;  Location: [facility];  Service: Cardiovascular;  Laterality: N/A;   LEFT HEART CATH AND CORONARY ANGIOGRAPHY N/A 09/20/2020   Procedure: LEFT HEART CATH AND CORONARY ANGIOGRAPHY;  Surgeon: Nelva Bush, MD;  Location: [facility];  Service: Cardiovascular;  Laterality: N/A;   RIGHT/LEFT HEART CATH AND CORONARY/GRAFT ANGIOGRAPHY N/A 02/07/2021   Procedure: RIGHT/LEFT HEART CATH AND CORONARY/GRAFT ANGIOGRAPHY;  Surgeon: Sherren Mocha, MD;  Location: [facility];  Service: Cardiovascular;  Laterality: N/A;   TEE WITHOUT CARDIOVERSION N/A 09/23/2020   Procedure: TRANSESOPHAGEAL ECHOCARDIOGRAM (TEE);  Surgeon: [name], MD;  Location: [facility];  Service: Open Heart Surgery;  Laterality: N/A;    Current Medications: Current Meds  Medication Sig   acetaminophen (TYLENOL)  500 MG tablet Take 2 tablets (1,000 mg total) by mouth every 6 (six) hours as needed.   ALPRAZolam (XANAX) 0.25 MG tablet Take 0.25 mg by mouth daily as needed for anxiety.   aspirin 81 MG EC tablet Take 1 tablet (81 mg total) by mouth daily. (Patient taking differently: Take 81 mg by mouth daily. HOLD FOR 4 WEEKS DUE TO HEMATOMA AT ICD SITE)   candesartan (ATACAND) 32 MG tablet Take 16 mg by mouth daily.   cetirizine (ZYRTEC) 10 MG tablet Take 10 mg by mouth as needed.   diphenhydrAMINE (BENADRYL) 25 mg capsule Take 25 mg by mouth every 6 (six) hours as needed for allergies or sleep.   empagliflozin (JARDIANCE) 10 MG TABS tablet Take 1 tablet (10 mg total) by mouth daily before breakfast.   ezetimibe (ZETIA) 10 MG tablet Take 1 tablet (10 mg total) by mouth daily.   ferrous sulfate 325 (65 FE) MG tablet Take 325 mg by mouth daily with breakfast.   metFORMIN (GLUCOPHAGE) 500 MG tablet Take 500 mg by mouth 2 (two) times daily with a meal.   Multiple Vitamin (MULTI VITAMIN DAILY PO) Take 1 tablet by mouth daily.   Multiple Vitamins-Minerals (PRESERVISION AREDS 2) CAPS Take 1 capsule by mouth 2 (two) times daily.   rosuvastatin (CRESTOR) 20 MG tablet Take 1 tablet (20 mg total) by mouth daily.   sertraline (ZOLOFT) 50 MG tablet Take 50 mg by mouth in the morning.   spironolactone (ALDACTONE) 25 MG tablet Take 0.5 tablets (12.5 mg total) by mouth daily.     Allergies:   Entresto [sacubitril-valsartan]   Social History   Socioeconomic History   Marital status: Married    Spouse name: Not on file   Number of children: Not on file   Years of education: Not on file   Highest education level: Not on file  Occupational History   Not on file  Tobacco Use   Smoking status: Never   Smokeless tobacco: Never  Substance and Sexual Activity   Alcohol use: Not on file   Drug use: Not on file   Sexual activity: Not on file  Other Topics Concern   Not on file  Social History Narrative   Not on  file   Social Determinants of Health   Financial Resource Strain: Not on file  Food Insecurity: Not on file  Transportation Needs: Not on file  Physical Activity: Not on file  Stress: Not on file  Social Connections: Not on file     Family History: The patient's family history includes Cancer in his father and mother. There is no history of CAD.  ROS:   Please see the history of present illness.    All other systems reviewed and are negative.  EKGs/Labs/Other Studies Reviewed:    The following studies were reviewed today: Today's 2D echocardiogram is personally reviewed, see below for discussion  EKG:  EKG is ordered today.  The ekg ordered today demonstrates normal sinus rhythm with frequent premature supraventricular beats, heart rate 60 bpm, age-indeterminate septal infarct.  Recent Labs: 11/08/2020: Magnesium 1.7 01/10/2021: TSH 2.740 01/28/2021: Platelets 209 02/07/2021:  Hemoglobin 12.2 03/24/2021: ALT 11 08/01/2021: BUN 23; Creatinine, Ser 1.04; Potassium 4.6; Sodium 135  Recent Lipid Panel    Component Value Date/Time   CHOL 123 03/24/2021 0923   TRIG 85 03/24/2021 0923   HDL 41 03/24/2021 0923   CHOLHDL 3.0 03/24/2021 0923   CHOLHDL 3.5 09/18/2020 0023   VLDL 19 09/18/2020 0023   LDLCALC 65 03/24/2021 0923     Risk Assessment/Calculations:          Physical Exam:    VS:  BP 118/68 (BP Location: Left Arm, Patient Position: Sitting, Cuff Size: Normal)   Pulse (!) 49   Ht '5\' 9"'$  (1.753 m)   Wt 193 lb 6.4 oz (87.7 kg)   BMI 28.56 kg/m     Wt Readings from Last 3 Encounters:  11/02/21 193 lb 6.4 oz (87.7 kg)  07/04/21 188 lb (85.3 kg)  05/27/21 186 lb 9.6 oz (84.6 kg)     GEN:  Well nourished, well developed in no acute distress HEENT: Normal NECK: No JVD; No carotid bruits LYMPHATICS: No lymphadenopathy CARDIAC: RRR, no murmurs, rubs, gallops RESPIRATORY:  Clear to auscultation without rales, wheezing or rhonchi  ABDOMEN: Soft, non-tender,  non-distended MUSCULOSKELETAL:  No edema; No deformity  SKIN: Warm and dry NEUROLOGIC:  Alert and oriented x 3 PSYCHIATRIC:  Normal affect   ASSESSMENT:    1. Chronic systolic heart failure ([place])   2. Nonrheumatic aortic valve stenosis   3. Mixed hyperlipidemia   4. Essential hypertension   5. Type 2 diabetes mellitus with complication, without long-term current use of insulin (H[facility])    PLAN:    In order of problems listed above:  The patient has done really well with titration of his medical therapy.  He reports New York Heart Association functional class I symptoms at present.  He has undergone ICD implantation by Dr. Lovena Le.  His current regimen includes candesartan, Jardiance, and spironolactone.  He could not tolerate a beta-blocker and is feeling much better since this has been discontinued.  He also could not tolerate Entresto.  The patient's echo from this morning is currently pending formal review, but on my review, LV function has improved significantly from his last study in October 2022 when his LVEF was 25 to 30%.  His acoustic windows are difficult but echo contrast is used today and I think his LV function is much better than it was last year.  Will await formal interpretation. Today's echo is reviewed.  The patient is asymptomatic.  His aortic valve is calcified and restricted, but poorly visualized on echo.  The peak transaortic velocity is 3.3 m/s, peak and mean gradients 43 and 24 mmHg, respectively, and dimensionless index is 0.26.  I think these findings are consistent with moderate aortic stenosis.  I would recommend clinical follow-up in 6 months and a repeat echo study in 1 year. Treated with rosuvastatin 20 mg daily and Zetia 10 mg daily.  LDL cholesterol is 38.  He is doing a very good job with lifestyle modification. Blood pressure under ideal control on his current medicines. Patient on Jardiance and metformin.  Followed by PCP.  Hemoglobin A1c is 5.7.  Overall the  patient is doing extremely well and will continue on his current medical program.  I will see him back in 6 months for follow-up evaluation.           Medication Adjustments/Labs and Tests Ordered: Current medicines are reviewed at length with the patient today.  Concerns regarding medicines  are outlined above.  No orders of the defined types were placed in this encounter.  No orders of the defined types were placed in this encounter.   There are no Patient Instructions on file for this visit.   Signed, Sherren Mocha, MD  11/02/2021 10:24 AM    [place]

## 2021-11-02 NOTE — Patient Instructions (Signed)
Medication Instructions:  Your physician recommends that you continue on your current medications as directed. Please refer to the Current Medication list given to you today.  *If you need a refill on your cardiac medications before your next appointment, please call your pharmacy*   Lab Work: NONE If you have labs (blood work) drawn today and your tests are completely normal, you will receive your results only by: [place] (if you have MyChart) OR A paper copy in the mail If you have any lab test that is abnormal or we need to change your treatment, we will call you to review the results.   Testing/Procedures: NONE   Follow-Up: At [facility], you and your health needs are our priority.  As part of our continuing mission to provide you with exceptional heart care, we have created designated Provider Care Teams.  These Care Teams include your primary Cardiologist (physician) and Advanced Practice Providers (APPs -  Physician Assistants and Nurse Practitioners) who all work together to provide you with the care you need, when you need it.  We recommend signing up for the patient portal called "MyChart".  Sign up information is provided on this After Visit Summary.  MyChart is used to connect with patients for Virtual Visits (Telemedicine).  Patients are able to view lab/test results, encounter notes, upcoming appointments, etc.  Non-urgent messages can be sent to your provider as well.   To learn more about what you can do with MyChart, go to [email or web address].    Your next appointment:   6 month(s)  The format for your next appointment:   In Person  Provider:   Sherren Mocha,. MD    Important Information About [place]

## 2021-11-22 ENCOUNTER — Ambulatory Visit (INDEPENDENT_AMBULATORY_CARE_PROVIDER_SITE_OTHER): Payer: Medicare Other

## 2021-11-22 ENCOUNTER — Encounter: Payer: Self-pay | Admitting: Internal Medicine

## 2021-11-22 DIAGNOSIS — Z95810 Presence of automatic (implantable) cardiac defibrillator: Secondary | ICD-10-CM

## 2021-11-22 DIAGNOSIS — I469 Cardiac arrest, cause unspecified: Secondary | ICD-10-CM

## 2021-11-22 LAB — CUP PACEART REMOTE DEVICE CHECK
Battery Remaining Longevity: 133 mo
Battery Voltage: 3.05 V
Brady Statistic RV Percent Paced: 1.19 %
Date Time Interrogation Session: 20230829012205
HighPow Impedance: 63 Ohm
Implantable Lead Implant Date: 20221128
Implantable Lead Location: 753860
Implantable Lead Model: 6935
Implantable Pulse Generator Implant Date: 20221128
Lead Channel Impedance Value: 342 Ohm
Lead Channel Impedance Value: 399 Ohm
Lead Channel Pacing Threshold Amplitude: 0.625 V
Lead Channel Pacing Threshold Pulse Width: 0.4 ms
Lead Channel Sensing Intrinsic Amplitude: 17.875 mV
Lead Channel Sensing Intrinsic Amplitude: 17.875 mV
Lead Channel Setting Pacing Amplitude: 2 V
Lead Channel Setting Pacing Pulse Width: 0.4 ms
Lead Channel Setting Sensing Sensitivity: 0.3 mV

## 2021-11-29 DIAGNOSIS — E7800 Pure hypercholesterolemia, unspecified: Secondary | ICD-10-CM | POA: Diagnosis not present

## 2021-11-29 DIAGNOSIS — I5040 Unspecified combined systolic (congestive) and diastolic (congestive) heart failure: Secondary | ICD-10-CM | POA: Diagnosis not present

## 2021-11-29 DIAGNOSIS — I10 Essential (primary) hypertension: Secondary | ICD-10-CM | POA: Diagnosis not present

## 2021-11-29 DIAGNOSIS — E1169 Type 2 diabetes mellitus with other specified complication: Secondary | ICD-10-CM | POA: Diagnosis not present

## 2021-12-16 NOTE — Progress Notes (Signed)
Remote ICD transmission.   

## 2022-01-02 DIAGNOSIS — Z23 Encounter for immunization: Secondary | ICD-10-CM | POA: Diagnosis not present

## 2022-01-02 DIAGNOSIS — I5040 Unspecified combined systolic (congestive) and diastolic (congestive) heart failure: Secondary | ICD-10-CM | POA: Diagnosis not present

## 2022-01-02 DIAGNOSIS — E663 Overweight: Secondary | ICD-10-CM | POA: Diagnosis not present

## 2022-01-02 DIAGNOSIS — E1169 Type 2 diabetes mellitus with other specified complication: Secondary | ICD-10-CM | POA: Diagnosis not present

## 2022-01-02 DIAGNOSIS — I2510 Atherosclerotic heart disease of native coronary artery without angina pectoris: Secondary | ICD-10-CM | POA: Diagnosis not present

## 2022-01-02 DIAGNOSIS — I350 Nonrheumatic aortic (valve) stenosis: Secondary | ICD-10-CM | POA: Diagnosis not present

## 2022-01-02 DIAGNOSIS — Z95810 Presence of automatic (implantable) cardiac defibrillator: Secondary | ICD-10-CM | POA: Diagnosis not present

## 2022-01-02 DIAGNOSIS — I10 Essential (primary) hypertension: Secondary | ICD-10-CM | POA: Diagnosis not present

## 2022-01-10 ENCOUNTER — Telehealth: Payer: Self-pay | Admitting: Cardiovascular Disease

## 2022-01-10 ENCOUNTER — Encounter: Payer: Self-pay | Admitting: Pharmacist

## 2022-01-10 NOTE — Telephone Encounter (Signed)
New Message:     Patient's wife  would like for Judson Roch to give her a call please. She said the patient received the packet,but there was no information from August 2023.

## 2022-01-11 NOTE — Telephone Encounter (Signed)
Returned call to wife Ginger. I have sent additional information to [place] per request and she will pick up copies here tomorrow.

## 2022-01-16 ENCOUNTER — Other Ambulatory Visit: Payer: Self-pay | Admitting: Cardiovascular Disease

## 2022-02-13 ENCOUNTER — Other Ambulatory Visit: Payer: Self-pay | Admitting: Cardiovascular Disease

## 2022-02-21 ENCOUNTER — Ambulatory Visit (INDEPENDENT_AMBULATORY_CARE_PROVIDER_SITE_OTHER): Payer: Medicare Other

## 2022-02-21 DIAGNOSIS — I469 Cardiac arrest, cause unspecified: Secondary | ICD-10-CM

## 2022-02-21 LAB — CUP PACEART REMOTE DEVICE CHECK
Battery Remaining Longevity: 132 mo
Battery Voltage: 3.03 V
Brady Statistic RV Percent Paced: 0.28 %
Date Time Interrogation Session: 20231128001604
HighPow Impedance: 59 Ohm
Implantable Lead Connection Status: 753985
Implantable Lead Implant Date: 20221128
Implantable Lead Location: 753860
Implantable Lead Model: 6935
Implantable Pulse Generator Implant Date: 20221128
Lead Channel Impedance Value: 304 Ohm
Lead Channel Impedance Value: 399 Ohm
Lead Channel Pacing Threshold Amplitude: 0.625 V
Lead Channel Pacing Threshold Pulse Width: 0.4 ms
Lead Channel Sensing Intrinsic Amplitude: 17.5 mV
Lead Channel Sensing Intrinsic Amplitude: 17.5 mV
Lead Channel Setting Pacing Amplitude: 2 V
Lead Channel Setting Pacing Pulse Width: 0.4 ms
Lead Channel Setting Sensing Sensitivity: 0.3 mV
Zone Setting Status: 755011
Zone Setting Status: 755011

## 2022-03-07 ENCOUNTER — Other Ambulatory Visit: Payer: Self-pay | Admitting: Cardiovascular Disease

## 2022-03-11 ENCOUNTER — Other Ambulatory Visit: Payer: Self-pay | Admitting: Cardiovascular Disease

## 2022-03-22 NOTE — Progress Notes (Signed)
Remote ICD transmission.   

## 2022-04-13 DIAGNOSIS — Z961 Presence of intraocular lens: Secondary | ICD-10-CM | POA: Diagnosis not present

## 2022-04-13 DIAGNOSIS — E119 Type 2 diabetes mellitus without complications: Secondary | ICD-10-CM | POA: Diagnosis not present

## 2022-05-15 ENCOUNTER — Ambulatory Visit: Payer: Medicare Other | Attending: Cardiovascular Disease | Admitting: Cardiovascular Disease

## 2022-05-15 ENCOUNTER — Encounter: Payer: Self-pay | Admitting: Cardiovascular Disease

## 2022-05-15 VITALS — BP 120/70 | HR 53 | Ht 69.0 in | Wt 193.0 lb

## 2022-05-15 DIAGNOSIS — E782 Mixed hyperlipidemia: Secondary | ICD-10-CM | POA: Diagnosis not present

## 2022-05-15 DIAGNOSIS — I5022 Chronic systolic (congestive) heart failure: Secondary | ICD-10-CM | POA: Diagnosis not present

## 2022-05-15 DIAGNOSIS — I350 Nonrheumatic aortic (valve) stenosis: Secondary | ICD-10-CM | POA: Diagnosis not present

## 2022-05-15 DIAGNOSIS — I10 Essential (primary) hypertension: Secondary | ICD-10-CM | POA: Diagnosis not present

## 2022-05-15 DIAGNOSIS — I2510 Atherosclerotic heart disease of native coronary artery without angina pectoris: Secondary | ICD-10-CM

## 2022-05-15 NOTE — Progress Notes (Unsigned)
Cardiology Office Note:    Date:  05/15/2022   ID:  Brian Robinson, DOB 07/30/1945, MRN [id number]  PCP:  Seward Carol, [place] [facility] Cardiologist:  Shelva Majestic, MD     Referring MD: Seward Carol, MD   Chief Complaint  Patient presents with   Coronary Artery Disease    History of Present Illness:    Brian Robinson is a 77 y.o. male with a hx of coronary artery disease complicated by out-of-hospital cardiac arrest at the time of his initial presentation.  He ultimately was treated with multivessel CABG.  The patient was noted to have severe calcification and restricted appearance of his aortic valve leaflets.  However, echo assessment and cardiac catheterization invasive gradient assessment was consistent with only mild aortic stenosis.  He was seen back in the office November 2022 for follow-up evaluation.  At that time he was noted to have functional class III symptoms.  His follow-up echocardiogram showed peak and mean transaortic gradients of only 19 and 11 mmHg, respectively.  However his stroke-volume index was low at 24 and his aortic valve dimensionless index was 0.25.  Cardiac catheterization demonstrated a very low gradient across his aortic valve, but he was noted to have hemodynamics consistent with elevated filling pressures and congestive heart failure.  He was given a single dose of IV diuretic at the time of cardiac catheterization and was started on oral loop diuretic.  He has been followed in our Pharm.D. clinic for titration of heart failure therapies.   The patient is here with his wife today.  He is doing very well.  He denies chest pain, chest pressure, shortness of breath, heart palpitations, lightheadedness, or syncope.  He is not exercising regularly, but he is active and has no symptoms with his level of activity.  States he is able to walk up and down stairs or do yard work without any functional limitation.  Seems to be tolerating his  medications well.  No other complaints at this time.  Past Medical History:  Diagnosis Date   COVID-19    received [place] 01/2020   Diabetes mellitus without complication ([place])    Heart murmur    Hypertension    Mild aortic stenosis     Past Surgical History:  Procedure Laterality Date   CORONARY ARTERY BYPASS GRAFT N/A 09/23/2020   Procedure: CORONARY ARTERY BYPASS GRAFTING (CABG), ON PUMP, TIMES THREE, USING LEFT INTERNAL MAMMARY ARTERY AND LEFT ENDOSCOPICALLY HARVESTED GREATER SAPHENOUS VEIN;  Surgeon: [name], MD;  Location: [place];  Service: Open Heart Surgery;  Laterality: N/A;   ENDOVEIN HARVEST OF GREATER SAPHENOUS VEIN Left 09/23/2020   Procedure: ENDOVEIN HARVEST OF GREATER SAPHENOUS VEIN;  Surgeon: [name], MD;  Location: [place];  Service: Open Heart Surgery;  Laterality: Left;   ICD IMPLANT N/A 02/21/2021   Procedure: ICD IMPLANT;  Surgeon: Evans Lance, MD;  Location: [place] [facility];  Service: Cardiovascular;  Laterality: N/A;   LEFT HEART CATH AND CORONARY ANGIOGRAPHY N/A 09/20/2020   Procedure: LEFT HEART CATH AND CORONARY ANGIOGRAPHY;  Surgeon: Nelva Bush, MD;  Location: [place] [facility];  Service: Cardiovascular;  Laterality: N/A;   RIGHT/LEFT HEART CATH AND CORONARY/GRAFT ANGIOGRAPHY N/A 02/07/2021   Procedure: RIGHT/LEFT HEART CATH AND CORONARY/GRAFT ANGIOGRAPHY;  Surgeon: Sherren Mocha, MD;  Location: [place] [facility];  Service: Cardiovascular;  Laterality: N/A;   TEE WITHOUT CARDIOVERSION N/A 09/23/2020   Procedure: TRANSESOPHAGEAL ECHOCARDIOGRAM (TEE);  Surgeon: Roxan Hockey,  Revonda Standard, MD;  Location: [place];  Service: Open Heart Surgery;  Laterality: N/A;    Current Medications: Current Meds  Medication Sig   acetaminophen (TYLENOL) 500 MG tablet Take 2 tablets (1,000 mg total) by mouth every 6 (six) hours as needed.   ALPRAZolam (XANAX) 0.25 MG tablet Take 0.25 mg by mouth daily as needed for anxiety.   aspirin 81 MG EC  tablet Take 1 tablet (81 mg total) by mouth daily. (Patient taking differently: Take 81 mg by mouth daily. HOLD FOR 4 WEEKS DUE TO HEMATOMA AT ICD SITE)   candesartan (ATACAND) 32 MG tablet Take 16 mg by mouth daily.   cetirizine (ZYRTEC) 10 MG tablet Take 10 mg by mouth as needed.   diphenhydrAMINE (BENADRYL) 25 mg capsule Take 25 mg by mouth every 6 (six) hours as needed for allergies or sleep.   empagliflozin (JARDIANCE) 10 MG TABS tablet TAKE 1 TABLET BY MOUTH DAILY BEFORE BREAKFAST.   ezetimibe (ZETIA) 10 MG tablet TAKE 1 TABLET BY MOUTH EVERY DAY   ferrous sulfate 325 (65 FE) MG tablet Take 325 mg by mouth daily with breakfast.   metFORMIN (GLUCOPHAGE) 500 MG tablet Take 500 mg by mouth 2 (two) times daily with a meal.   Multiple Vitamin (MULTI VITAMIN DAILY PO) Take 1 tablet by mouth daily.   Multiple Vitamins-Minerals (PRESERVISION AREDS 2) CAPS Take 1 capsule by mouth 2 (two) times daily.   rosuvastatin (CRESTOR) 20 MG tablet TAKE 1 TABLET BY MOUTH EVERY DAY   sertraline (ZOLOFT) 50 MG tablet Take 50 mg by mouth in the morning.   spironolactone (ALDACTONE) 25 MG tablet Take 0.5 tablets (12.5 mg total) by mouth daily.     Allergies:   Entresto [sacubitril-valsartan]   Social History   Socioeconomic History   Marital status: Married    Spouse name: Not on file   Number of children: Not on file   Years of education: Not on file   Highest education level: Not on file  Occupational History   Not on file  Tobacco Use   Smoking status: Never   Smokeless tobacco: Never  Substance and Sexual Activity   Alcohol use: Not on file   Drug use: Not on file   Sexual activity: Not on file  Other Topics Concern   Not on file  Social History Narrative   Not on file   Social Determinants of Health   Financial Resource Strain: Not on file  Food Insecurity: Not on file  Transportation Needs: Not on file  Physical Activity: Not on file  Stress: Not on file  Social Connections: Not on  file     Family History: The patient's family history includes Cancer in his father and mother. There is no history of CAD.  ROS:   Please see the history of present illness.    All other systems reviewed and are negative.  EKGs/Labs/Other Studies Reviewed:    The following studies were reviewed today: Echo 11/02/2021: 1. Left ventricular ejection fraction, by estimation, is 45 to 50%. The  left ventricle has mildly decreased function. Left ventricular endocardial  border not optimally defined to evaluate regional wall motion though the  apical septal wall appears  hypokinetic. There is mild left ventricular hypertrophy. Left ventricular  diastolic parameters are consistent with Grade II diastolic dysfunction  (pseudonormalization).   2. Right ventricular systolic function is mildly reduced. The right  ventricular size is normal. There is normal pulmonary artery systolic  pressure. The estimated  right ventricular systolic pressure is [id number] mmHg.   3. Left atrial size was moderately dilated.   4. The mitral valve is normal in structure. No evidence of mitral valve  regurgitation. No evidence of mitral stenosis.   5. The aortic valve is tricuspid. There is severe calcifcation of the  aortic valve. Aortic valve regurgitation is not visualized. Moderate to  severe aortic valve stenosis. Aortic valve area, by VTI measures 0.99 cm.  Aortic valve mean gradient measures  24.0 mmHg.   6. Aortic dilatation noted. There is mild dilatation of the aortic root,  measuring 37 mm.   7. The inferior vena cava is dilated in size with >50% respiratory  variability, suggesting right atrial pressure of 8 mmHg.   8. Technically difficult study with very poor images, however LV EF does  appear better than on prior echo.   Comparison(s): 12/27/20 EF 25-30%. Severe AS 30mHg mean PG, 140mg peak  PG.    EKG:  EKG is not ordered today.    Recent Labs: 08/01/2021: BUN 23; Creatinine, Ser 1.04;  Potassium 4.6; Sodium 135  Recent Lipid Panel    Component Value Date/Time   CHOL 123 03/24/2021 0923   TRIG 85 03/24/2021 0923   HDL 41 03/24/2021 0923   CHOLHDL 3.0 03/24/2021 0923   CHOLHDL 3.5 09/18/2020 0023   VLDL 19 09/18/2020 0023   LDLCALC 65 03/24/2021 0923     Risk Assessment/Calculations:                Physical Exam:    VS:  BP 120/70   Pulse (!) 53   Ht 5' 9"$  (1.753 m)   Wt 193 lb (87.5 kg)   SpO2 99%   BMI 28.50 kg/m     Wt Readings from Last 3 Encounters:  05/15/22 193 lb (87.5 kg)  11/02/21 193 lb 6.4 oz (87.7 kg)  07/04/21 188 lb (85.3 kg)     GEN:  Well nourished, well developed in no acute distress HEENT: Normal NECK: No JVD; No carotid bruits LYMPHATICS: No lymphadenopathy CARDIAC: RRR, 3/6 harsh mid peaking systolic murmur heard best at the LLSB but also the RUSB, A2 intact RESPIRATORY:  Clear to auscultation without rales, wheezing or rhonchi  ABDOMEN: Soft, non-tender, non-distended MUSCULOSKELETAL:  No edema; No deformity  SKIN: Warm and dry NEUROLOGIC:  Alert and oriented x 3 PSYCHIATRIC:  Normal affect   ASSESSMENT:    1. Chronic systolic heart failure ([place]  2. Nonrheumatic aortic valve stenosis   3. Coronary artery disease involving native coronary artery of native heart without angina pectoris   4. Mixed hyperlipidemia   5. Essential hypertension    PLAN:    In order of problems listed above:  The patient has had improvement in LV function on medical therapy.  His current regimen includes candesartan, Jardiance, and spironolactone.  He did not tolerate beta-blockade and feels much better off of this medication.  Will continue his current regimen and repeat an echocardiogram in 6 months before his outpatient follow-up visit. The patient has moderately severe aortic stenosis.  I reviewed his most recent echocardiogram as outlined above.  He appears to be completely asymptomatic at this time.  We reviewed the natural history of  aortic stenosis and he understands that it is likely he may need intervention in the next few years.  Repeat echocardiogram for surveillance in 6 months.  We discussed potential symptoms that could come from aortic stenosis such as progressive fatigue, shortness of breath, chest  discomfort, or lightheadedness.  The patient will watch out for the symptoms. Doing well on aspirin and a high intensity statin drug.  No anginal symptoms since bypass surgery. Lipids are at goal on rosuvastatin and Zetia.  LDL cholesterol is 27.   Blood pressure is well-controlled on the patient's current medical therapy.     Medication Adjustments/Labs and Tests Ordered: Current medicines are reviewed at length with the patient today.  Concerns regarding medicines are outlined above.  No orders of the defined types were placed in this encounter.  No orders of the defined types were placed in this encounter.   Patient Instructions  Medication Instructions:  Your physician recommends that you continue on your current medications as directed. Please refer to the Current Medication list given to you today.  *If you need a refill on your cardiac medications before your next appointment, please call your pharmacy*   Lab Work: NONE If you have labs (blood work) drawn today and your tests are completely normal, you will receive your results only by: [place] (if you have MyChart) OR A paper copy in the mail If you have any lab test that is abnormal or we need to change your treatment, we will call you to review the results.   Testing/Procedures: ECHO (in 6 months) Your physician has requested that you have an echocardiogram. Echocardiography is a painless test that uses sound waves to create images of your heart. It provides your doctor with information about the size and shape of your heart and how well your heart's chambers and valves are working. This procedure takes approximately one hour. There are no  restrictions for this procedure. Please do NOT wear cologne, perfume, aftershave, or lotions (deodorant is allowed). Please arrive 15 minutes prior to your appointment time.    Follow-Up: At [facility], you and your health needs are our priority.  As part of our continuing mission to provide you with exceptional heart care, we have created designated Provider Care Teams.  These Care Teams include your primary Cardiologist (physician) and Advanced Practice Providers (APPs -  Physician Assistants and Nurse Practitioners) who all work together to provide you with the care you need, when you need it.  We recommend signing up for the patient portal called "MyChart".  Sign up information is provided on this After Visit Summary.  MyChart is used to connect with patients for Virtual Visits (Telemedicine).  Patients are able to view lab/test results, encounter notes, upcoming appointments, etc.  Non-urgent messages can be sent to your provider as well.   To learn more about what you can do with MyChart, go to [email or web address].    Your next appointment:   6 month(s)  Provider:   Sherren Mocha, MD     Signed, Sherren Mocha, MD  05/15/2022 11:36 AM    [place]

## 2022-05-15 NOTE — Patient Instructions (Signed)
Medication Instructions:  Your physician recommends that you continue on your current medications as directed. Please refer to the Current Medication list given to you today.  *If you need a refill on your cardiac medications before your next appointment, please call your pharmacy*   Lab Work: NONE If you have labs (blood work) drawn today and your tests are completely normal, you will receive your results only by: [place] (if you have MyChart) OR A paper copy in the mail If you have any lab test that is abnormal or we need to change your treatment, we will call you to review the results.   Testing/Procedures: ECHO (in 6 months) Your physician has requested that you have an echocardiogram. Echocardiography is a painless test that uses sound waves to create images of your heart. It provides your doctor with information about the size and shape of your heart and how well your heart's chambers and valves are working. This procedure takes approximately one hour. There are no restrictions for this procedure. Please do NOT wear cologne, perfume, aftershave, or lotions (deodorant is allowed). Please arrive 15 minutes prior to your appointment time.    Follow-Up: At [facility], you and your health needs are our priority.  As part of our continuing mission to provide you with exceptional heart care, we have created designated Provider Care Teams.  These Care Teams include your primary Cardiologist (physician) and Advanced Practice Providers (APPs -  Physician Assistants and Nurse Practitioners) who all work together to provide you with the care you need, when you need it.  We recommend signing up for the patient portal called "MyChart".  Sign up information is provided on this After Visit Summary.  MyChart is used to connect with patients for Virtual Visits (Telemedicine).  Patients are able to view lab/test results, encounter notes, upcoming appointments, etc.  Non-urgent messages  can be sent to your provider as well.   To learn more about what you can do with MyChart, go to [email or web address].    Your next appointment:   6 month(s)  Provider:   Sherren Mocha, MD

## 2022-05-23 ENCOUNTER — Ambulatory Visit: Payer: Medicare Other

## 2022-05-23 DIAGNOSIS — I469 Cardiac arrest, cause unspecified: Secondary | ICD-10-CM | POA: Diagnosis not present

## 2022-05-23 LAB — CUP PACEART REMOTE DEVICE CHECK
Battery Remaining Longevity: 130 mo
Battery Voltage: 3.03 V
Brady Statistic RV Percent Paced: 0.19 %
Date Time Interrogation Session: 20240227033525
HighPow Impedance: 60 Ohm
Implantable Lead Connection Status: 753985
Implantable Lead Implant Date: 20221128
Implantable Lead Location: 753860
Implantable Lead Model: 6935
Implantable Pulse Generator Implant Date: 20221128
Lead Channel Impedance Value: 304 Ohm
Lead Channel Impedance Value: 399 Ohm
Lead Channel Pacing Threshold Amplitude: 0.625 V
Lead Channel Pacing Threshold Pulse Width: 0.4 ms
Lead Channel Sensing Intrinsic Amplitude: 16.375 mV
Lead Channel Sensing Intrinsic Amplitude: 16.375 mV
Lead Channel Setting Pacing Amplitude: 2 V
Lead Channel Setting Pacing Pulse Width: 0.4 ms
Lead Channel Setting Sensing Sensitivity: 0.3 mV
Zone Setting Status: 755011
Zone Setting Status: 755011

## 2022-06-28 NOTE — Progress Notes (Signed)
Remote ICD transmission.   

## 2022-07-07 ENCOUNTER — Other Ambulatory Visit: Payer: Self-pay | Admitting: Cardiovascular Disease

## 2022-07-07 MED ORDER — EMPAGLIFLOZIN 10 MG PO TABS: 10.0000 mg | ORAL_TABLET | Freq: Every day | ORAL | 3 refills | Status: DC

## 2022-07-12 DIAGNOSIS — Z95810 Presence of automatic (implantable) cardiac defibrillator: Secondary | ICD-10-CM | POA: Diagnosis not present

## 2022-07-12 DIAGNOSIS — E663 Overweight: Secondary | ICD-10-CM | POA: Diagnosis not present

## 2022-07-12 DIAGNOSIS — E7800 Pure hypercholesterolemia, unspecified: Secondary | ICD-10-CM | POA: Diagnosis not present

## 2022-07-12 DIAGNOSIS — Z1331 Encounter for screening for depression: Secondary | ICD-10-CM | POA: Diagnosis not present

## 2022-07-12 DIAGNOSIS — I358 Other nonrheumatic aortic valve disorders: Secondary | ICD-10-CM | POA: Diagnosis not present

## 2022-07-12 DIAGNOSIS — Z0000 Encounter for general adult medical examination without abnormal findings: Secondary | ICD-10-CM | POA: Diagnosis not present

## 2022-07-12 DIAGNOSIS — I2510 Atherosclerotic heart disease of native coronary artery without angina pectoris: Secondary | ICD-10-CM | POA: Diagnosis not present

## 2022-07-12 DIAGNOSIS — I10 Essential (primary) hypertension: Secondary | ICD-10-CM | POA: Diagnosis not present

## 2022-07-12 DIAGNOSIS — E1169 Type 2 diabetes mellitus with other specified complication: Secondary | ICD-10-CM | POA: Diagnosis not present

## 2022-07-17 IMAGING — CT CT CERVICAL SPINE W/O CM
3 of 4 series · 12 of 33 positions shown, 14 images · non-contrast
Comparison: None.

CLINICAL DATA: Altered mental status following cardiac arrest

EXAM:
CT HEAD WITHOUT CONTRAST
CT CERVICAL SPINE WITHOUT CONTRAST
TECHNIQUE: Multidetector CT imaging of the head and cervical spine was
performed following the standard protocol without intravenous
contrast. Multiplanar CT image reconstructions of the cervical spine
were also generated.

[Series 8: c_spine 2.0 sag bone · sagittal · 0.30mm/px · 5 of 61 slices shown, 6 images]
[im 21/61  bone]
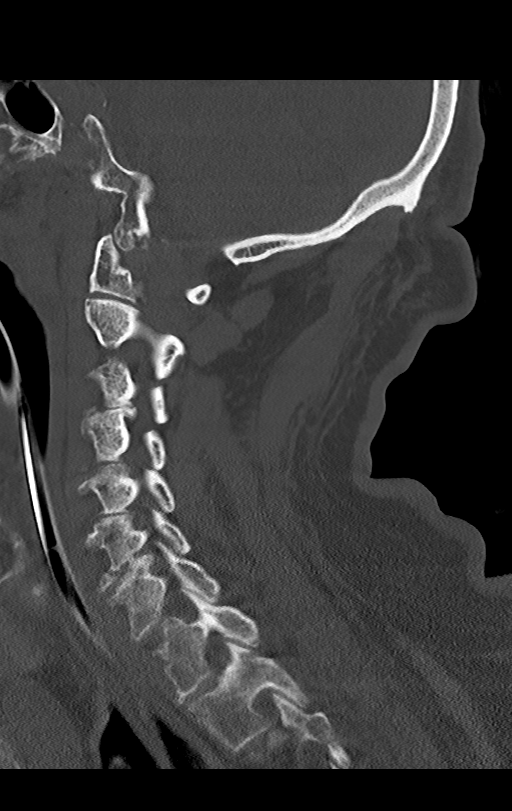
[im 26/61  bone]
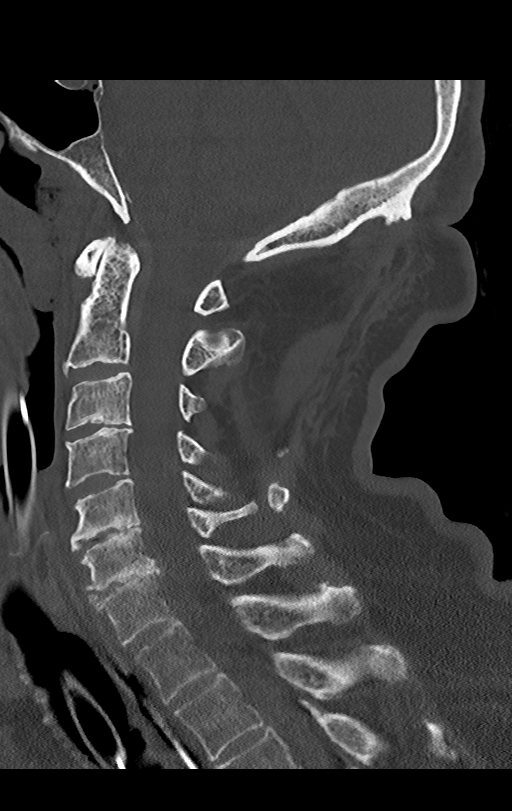
[im 31/61  soft-tissue]
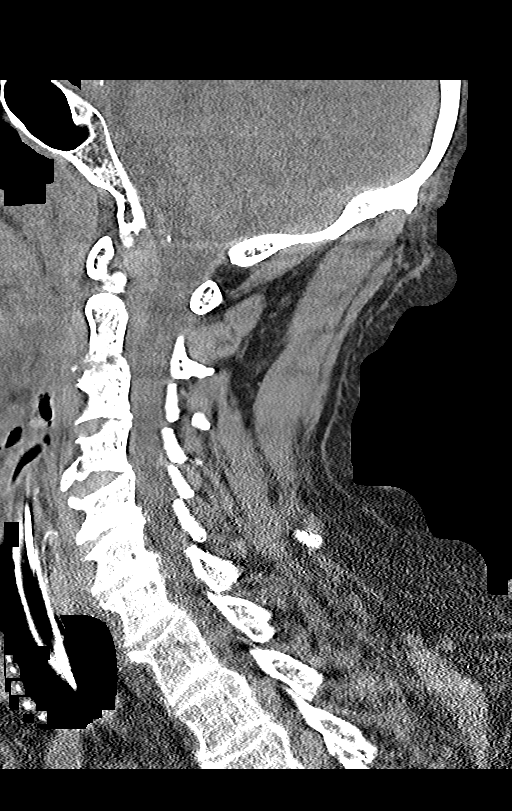
[im 31/61  bone]
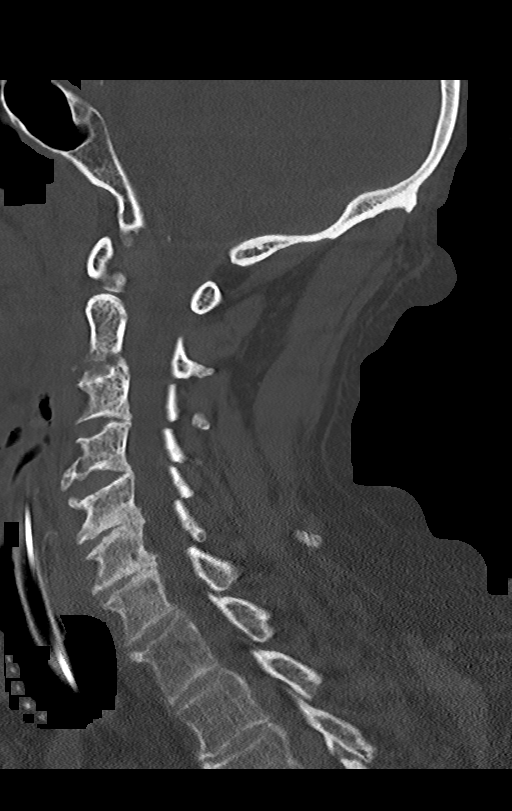
[im 36/61  bone]
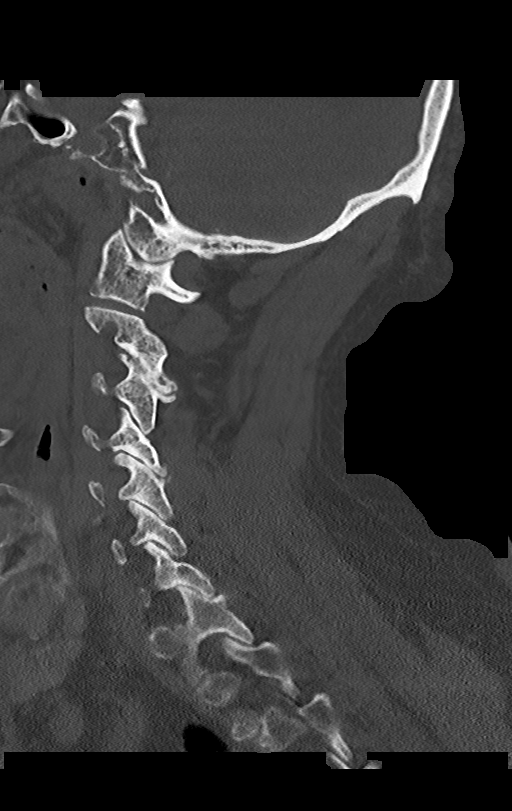
[im 41/61  bone]
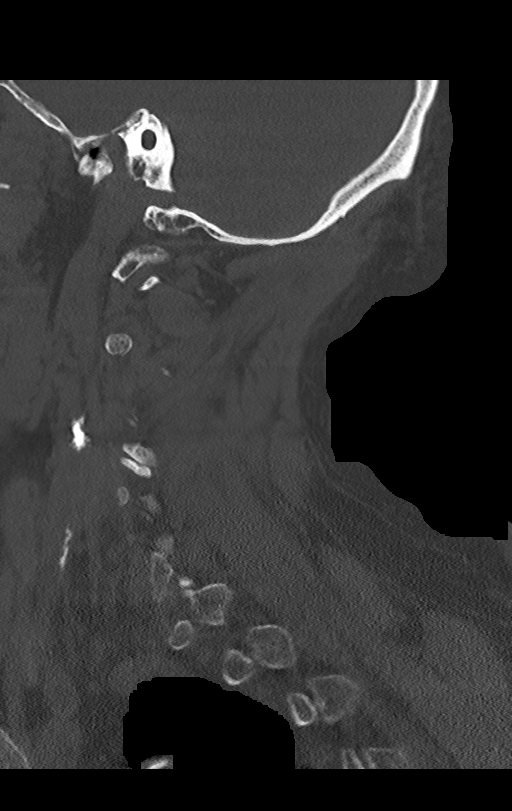

[Series 9: c_spine 2.0 cor bone · coronal · 0.30mm/px · 3 of 82 slices shown]
[im 17/82  bone]
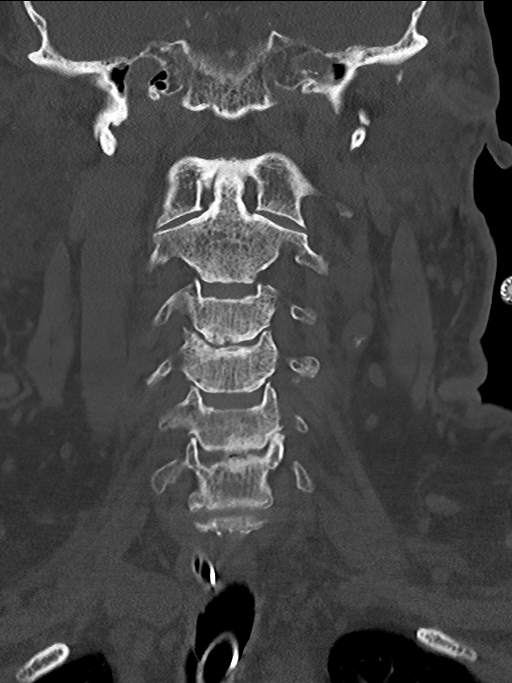
[im 33/82  bone]
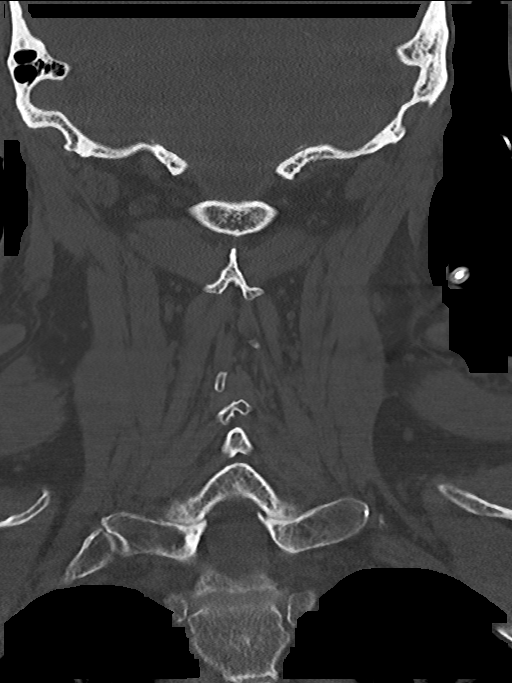
[im 49/82  bone]
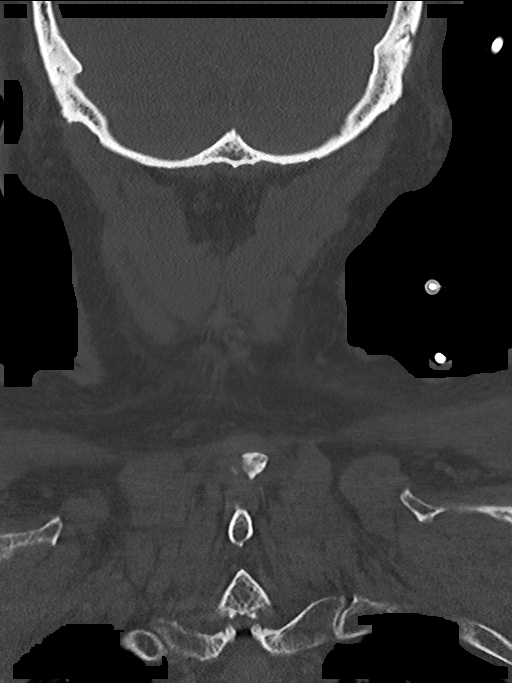

[Series 12: c_spine 2.0 st · axial · 0.29mm/px · z∈[-235,-99]mm · 4 of 104 slices shown, 5 images]
[im 18/104  soft-tissue]
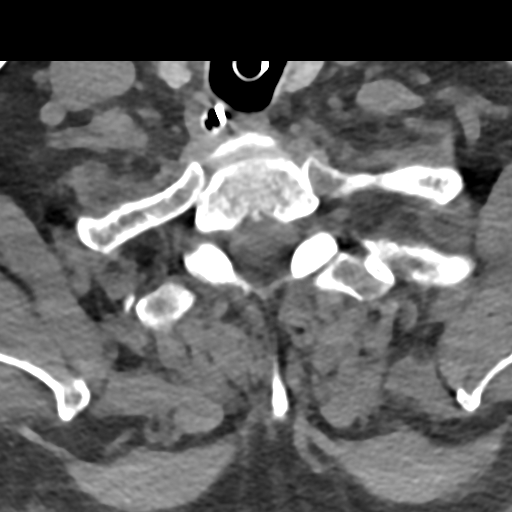
[im 18/104  bone]
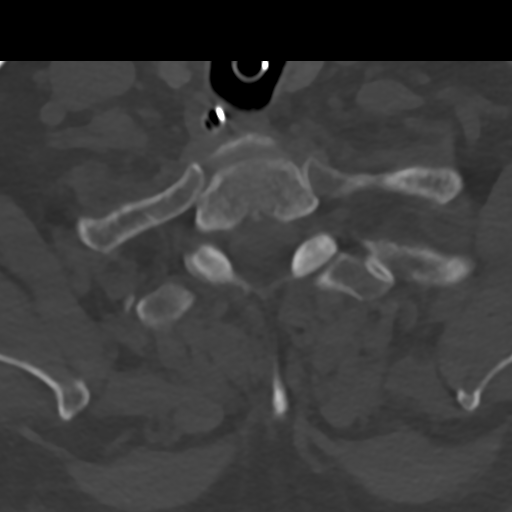
[im 35/104  bone]
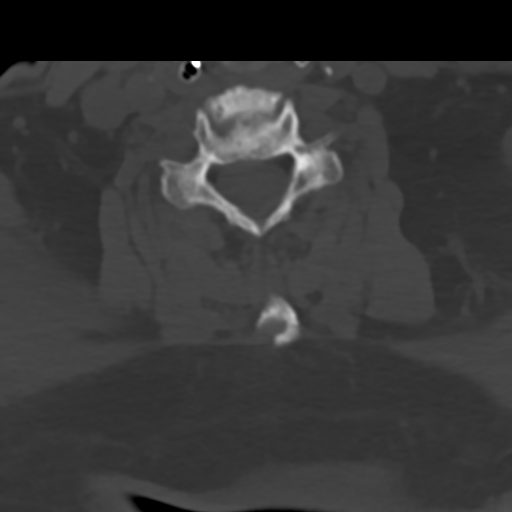
[im 69/104  bone]
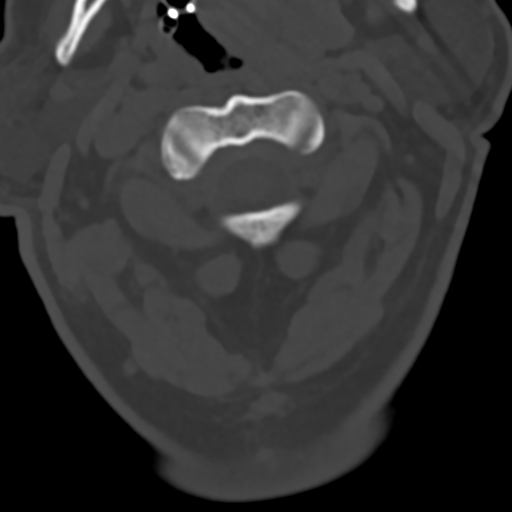
[im 86/104  bone]
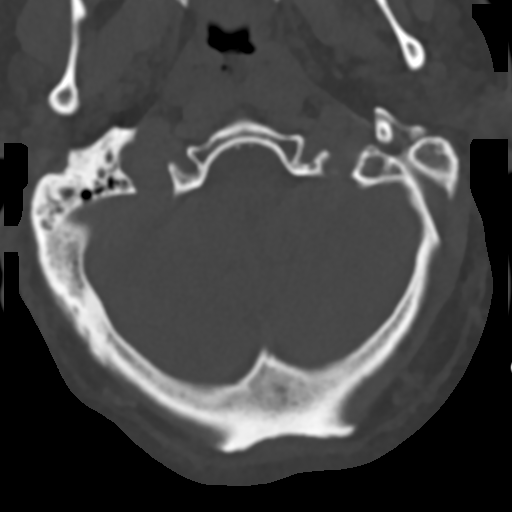

[12 of 33 positions shown; findings below may reference images not displayed]

FINDINGS: CT HEAD FINDINGS

Brain: Basal ganglia calcifications are identified. Mild atrophic
changes and chronic white matter ischemic changes are seen. No
findings to suggest acute hemorrhage, acute infarction or
space-occupying mass lesion are seen.

Vascular: No hyperdense vessel or unexpected calcification.

Skull: Normal. Negative for fracture or focal lesion.

Sinuses/Orbits: No acute finding.

Other: None.

CT CERVICAL SPINE FINDINGS

Alignment: Within normal limits.

Skull base and vertebrae: 7 cervical segments are well visualized.
Vertebral body height is well maintained. Multilevel facet
hypertrophic changes are noted. Osteophytic changes are seen as
well. Disc space narrowing is seen at multiple levels. No acute
fracture or acute facet abnormality is noted.

Soft tissues and spinal canal: Surrounding soft tissue structures
are within normal limits. Endotracheal tube and gastric catheter are
seen although there distal aspects are not visualized on this exam.

Upper chest: Visualized lung apices appear within normal limits.

Other: None
IMPRESSION: CT of the head: No acute intracranial abnormality noted. Chronic
atrophic and ischemic changes are seen.

CT of the cervical spine: Multilevel degenerative change without
acute abnormality.

## 2022-08-22 ENCOUNTER — Ambulatory Visit (INDEPENDENT_AMBULATORY_CARE_PROVIDER_SITE_OTHER): Payer: Medicare Other

## 2022-08-22 DIAGNOSIS — I5022 Chronic systolic (congestive) heart failure: Secondary | ICD-10-CM

## 2022-08-23 LAB — CUP PACEART REMOTE DEVICE CHECK
Battery Remaining Longevity: 128 mo
Battery Voltage: 3.02 V
Brady Statistic RV Percent Paced: 0.18 %
Date Time Interrogation Session: 20240528022503
HighPow Impedance: 64 Ohm
Implantable Lead Connection Status: 753985
Implantable Lead Implant Date: 20221128
Implantable Lead Location: 753860
Implantable Lead Model: 6935
Implantable Pulse Generator Implant Date: 20221128
Lead Channel Impedance Value: 342 Ohm
Lead Channel Impedance Value: 418 Ohm
Lead Channel Pacing Threshold Amplitude: 0.625 V
Lead Channel Pacing Threshold Pulse Width: 0.4 ms
Lead Channel Sensing Intrinsic Amplitude: 17.25 mV
Lead Channel Sensing Intrinsic Amplitude: 17.25 mV
Lead Channel Setting Pacing Amplitude: 2 V
Lead Channel Setting Pacing Pulse Width: 0.4 ms
Lead Channel Setting Sensing Sensitivity: 0.3 mV
Zone Setting Status: 755011
Zone Setting Status: 755011

## 2022-08-31 DIAGNOSIS — E1169 Type 2 diabetes mellitus with other specified complication: Secondary | ICD-10-CM | POA: Diagnosis not present

## 2022-08-31 DIAGNOSIS — G4700 Insomnia, unspecified: Secondary | ICD-10-CM | POA: Diagnosis not present

## 2022-08-31 DIAGNOSIS — I5040 Unspecified combined systolic (congestive) and diastolic (congestive) heart failure: Secondary | ICD-10-CM | POA: Diagnosis not present

## 2022-08-31 DIAGNOSIS — I10 Essential (primary) hypertension: Secondary | ICD-10-CM | POA: Diagnosis not present

## 2022-08-31 DIAGNOSIS — E785 Hyperlipidemia, unspecified: Secondary | ICD-10-CM | POA: Diagnosis not present

## 2022-09-07 ENCOUNTER — Other Ambulatory Visit: Payer: Self-pay | Admitting: Cardiovascular Disease

## 2022-09-09 ENCOUNTER — Other Ambulatory Visit: Payer: Self-pay | Admitting: Cardiovascular Disease

## 2022-09-13 ENCOUNTER — Other Ambulatory Visit: Payer: Self-pay

## 2022-09-13 MED ORDER — CANDESARTAN CILEXETIL 32 MG PO TABS: 16.0000 mg | ORAL_TABLET | Freq: Every day | ORAL | 2 refills | Status: DC

## 2022-09-13 NOTE — Telephone Encounter (Signed)
Called pt and spoke with pt's wife and pt's wife stated that pt is taking medication candesartan 32 mg tablet, 1/2 tablet daily. I resent pt's medication into pt's pharmacy as requested. Confirmation received.

## 2022-09-18 DIAGNOSIS — H5021 Vertical strabismus, right eye: Secondary | ICD-10-CM | POA: Diagnosis not present

## 2022-09-18 NOTE — Progress Notes (Signed)
Remote ICD transmission.   

## 2022-11-13 ENCOUNTER — Ambulatory Visit (HOSPITAL_COMMUNITY): Payer: Medicare Other | Attending: Cardiovascular Disease

## 2022-11-13 DIAGNOSIS — I5022 Chronic systolic (congestive) heart failure: Secondary | ICD-10-CM | POA: Diagnosis not present

## 2022-11-13 DIAGNOSIS — E782 Mixed hyperlipidemia: Secondary | ICD-10-CM | POA: Diagnosis not present

## 2022-11-13 DIAGNOSIS — I350 Nonrheumatic aortic (valve) stenosis: Secondary | ICD-10-CM | POA: Insufficient documentation

## 2022-11-13 DIAGNOSIS — I10 Essential (primary) hypertension: Secondary | ICD-10-CM | POA: Diagnosis not present

## 2022-11-13 DIAGNOSIS — I2510 Atherosclerotic heart disease of native coronary artery without angina pectoris: Secondary | ICD-10-CM | POA: Insufficient documentation

## 2022-11-13 LAB — ECHOCARDIOGRAM COMPLETE
AR max vel: 0.79 cm2
AV Area VTI: 0.8 cm2
AV Area mean vel: 0.75 cm2
AV Mean grad: 13 mmHg
AV Peak grad: 25.2 mmHg
Ao pk vel: 2.51 m/s
Area-P 1/2: 2.5 cm2
S' Lateral: 2.75 cm

## 2022-11-13 MED ORDER — PERFLUTREN LIPID MICROSPHERE
1.0000 mL | INTRAVENOUS | Status: AC | PRN
Start: 2022-11-13 — End: 2022-11-13
  Administered 2022-11-13: 2 mL via INTRAVENOUS

## 2022-11-16 ENCOUNTER — Encounter: Payer: Self-pay | Admitting: Cardiovascular Disease

## 2022-11-16 ENCOUNTER — Ambulatory Visit: Payer: Medicare Other | Attending: Cardiovascular Disease | Admitting: Cardiovascular Disease

## 2022-11-16 VITALS — BP 126/70 | HR 53 | Ht 69.0 in | Wt 202.4 lb

## 2022-11-16 DIAGNOSIS — I350 Nonrheumatic aortic (valve) stenosis: Secondary | ICD-10-CM

## 2022-11-16 DIAGNOSIS — I10 Essential (primary) hypertension: Secondary | ICD-10-CM | POA: Diagnosis not present

## 2022-11-16 DIAGNOSIS — I5032 Chronic diastolic (congestive) heart failure: Secondary | ICD-10-CM

## 2022-11-16 DIAGNOSIS — I2510 Atherosclerotic heart disease of native coronary artery without angina pectoris: Secondary | ICD-10-CM

## 2022-11-16 DIAGNOSIS — E782 Mixed hyperlipidemia: Secondary | ICD-10-CM | POA: Diagnosis not present

## 2022-11-16 NOTE — Progress Notes (Signed)
Cardiology Office Note:    Date:  11/16/2022   ID:  Brian Robinson, DOB [birth year], MRN [id number]  PCP:  Renford Dills, MD   [facility] Providers Cardiologist:  Nicki Guadalajara, MD     Referring MD: Renford Dills, MD   Chief Complaint  Patient presents with   Aortic Stenosis    History of Present Illness:    Brian Robinson is a 77 y.o. male with a hx of coronary artery disease complicated by out-of-hospital cardiac arrest at the time of his initial presentation.  He ultimately was treated with multivessel CABG.  The patient was noted to have severe calcification and restricted appearance of his aortic valve leaflets.  However, echo assessment and cardiac catheterization invasive gradient assessment was consistent with only mild aortic stenosis.  He was seen back in the office November 2022 for follow-up evaluation.  At that time he was noted to have functional class III symptoms.  His follow-up echocardiogram showed peak and mean transaortic gradients of only 19 and 11 mmHg, respectively.  However his stroke-volume index was low at 24 and his aortic valve dimensionless index was 0.25.  Cardiac catheterization demonstrated a very low gradient across his aortic valve, but he was noted to have hemodynamics consistent with elevated filling pressures and congestive heart failure.  He was given a single dose of IV diuretic at the time of cardiac catheterization and was started on oral loop diuretic.  He has been followed in our Pharm.D. clinic for titration of heart failure therapies.   The patient is here with his wife today. He reports that he is doing well. He hasn't been very active during the summer months because of the hot temperatures. However, he has been out working in the yard the past few days with no exertional symptoms. Today, he denies symptoms of palpitations, chest pain, shortness of breath, orthopnea, PND, lower extremity edema, dizziness, or syncope.   Past Medical  History:  Diagnosis Date   COVID-19    received Mab 01/2020   Diabetes mellitus without complication (HCC)    Heart murmur    Hypertension    Mild aortic stenosis     Past Surgical History:  Procedure Laterality Date   CORONARY ARTERY BYPASS GRAFT N/A 09/23/2020   Procedure: CORONARY ARTERY BYPASS GRAFTING (CABG), ON PUMP, TIMES THREE, USING LEFT INTERNAL MAMMARY ARTERY AND LEFT ENDOSCOPICALLY HARVESTED GREATER SAPHENOUS VEIN;  Surgeon: Loreli Slot, MD;  Location: [facility] OR;  Service: Open Heart Surgery;  Laterality: N/A;   ENDOVEIN HARVEST OF GREATER SAPHENOUS VEIN Left 09/23/2020   Procedure: ENDOVEIN HARVEST OF GREATER SAPHENOUS VEIN;  Surgeon: Loreli Slot, MD;  Location: [facility] OR;  Service: Open Heart Surgery;  Laterality: Left;   ICD IMPLANT N/A 02/21/2021   Procedure: ICD IMPLANT;  Surgeon: Marinus Maw, MD;  Location: [facility] INVASIVE CV LAB;  Service: Cardiovascular;  Laterality: N/A;   LEFT HEART CATH AND CORONARY ANGIOGRAPHY N/A 09/20/2020   Procedure: LEFT HEART CATH AND CORONARY ANGIOGRAPHY;  Surgeon: Yvonne Kendall, MD;  Location: [facility] INVASIVE CV LAB;  Service: Cardiovascular;  Laterality: N/A;   RIGHT/LEFT HEART CATH AND CORONARY/GRAFT ANGIOGRAPHY N/A 02/07/2021   Procedure: RIGHT/LEFT HEART CATH AND CORONARY/GRAFT ANGIOGRAPHY;  Surgeon: Tonny Bollman, MD;  Location: [facility] INVASIVE CV LAB;  Service: Cardiovascular;  Laterality: N/A;   TEE WITHOUT CARDIOVERSION N/A 09/23/2020   Procedure: TRANSESOPHAGEAL ECHOCARDIOGRAM (TEE);  Surgeon: Loreli Slot, MD;  Location: [facility] OR;  Service: Open Heart Surgery;  Laterality: N/A;  Current Medications: Current Meds  Medication Sig   acetaminophen (TYLENOL) 500 MG tablet Take 2 tablets (1,000 mg total) by mouth every 6 (six) hours as needed.   ALPRAZolam (XANAX) 0.25 MG tablet Take 0.25 mg by mouth daily as needed for anxiety.   aspirin 81 MG EC tablet Take 1 tablet (81 mg total) by mouth daily. (Patient taking  differently: Take 81 mg by mouth daily. HOLD FOR 4 WEEKS DUE TO HEMATOMA AT ICD SITE)   candesartan (ATACAND) 16 MG tablet Take 16 mg by mouth daily.   cetirizine (ZYRTEC) 10 MG tablet Take 10 mg by mouth as needed.   diphenhydrAMINE (BENADRYL) 25 mg capsule Take 25 mg by mouth every 6 (six) hours as needed for allergies or sleep.   empagliflozin (JARDIANCE) 10 MG TABS tablet Take 1 tablet (10 mg total) by mouth daily before breakfast.   ezetimibe (ZETIA) 10 MG tablet TAKE 1 TABLET BY MOUTH EVERY DAY   ferrous sulfate 325 (65 FE) MG tablet Take 325 mg by mouth daily with breakfast.   metFORMIN (GLUCOPHAGE) 500 MG tablet Take 500 mg by mouth 2 (two) times daily with a meal.   Multiple Vitamin (MULTI VITAMIN DAILY PO) Take 1 tablet by mouth daily.   Multiple Vitamins-Minerals (PRESERVISION AREDS 2) CAPS Take 1 capsule by mouth 2 (two) times daily.   rosuvastatin (CRESTOR) 20 MG tablet TAKE 1 TABLET BY MOUTH EVERY DAY   sertraline (ZOLOFT) 50 MG tablet Take 50 mg by mouth in the morning.   spironolactone (ALDACTONE) 25 MG tablet Take 0.5 tablets (12.5 mg total) by mouth daily.     Allergies:   Entresto [sacubitril-valsartan]   Social History   Socioeconomic History   Marital status: Married    Spouse name: Not on file   Number of children: Not on file   Years of education: Not on file   Highest education level: Not on file  Occupational History   Not on file  Tobacco Use   Smoking status: Never   Smokeless tobacco: Never  Substance and Sexual Activity   Alcohol use: Not on file   Drug use: Not on file   Sexual activity: Not on file  Other Topics Concern   Not on file  Social History Narrative   Not on file   Social Determinants of Health   Financial Resource Strain: Not on file  Food Insecurity: Not on file  Transportation Needs: Not on file  Physical Activity: Not on file  Stress: Not on file  Social Connections: Not on file     Family History: The patient's family  history includes Cancer in his father and mother. There is no history of CAD.  ROS:   Please see the history of present illness.    All other systems reviewed and are negative.  EKGs/Labs/Other Studies Reviewed:    The following studies were reviewed today: Echo 11/13/2022:  1. Left ventricular ejection fraction, by estimation, is 50 to 55%. The  left ventricle has low normal function. The left ventricle has no regional  wall motion abnormalities. There is mild left ventricular hypertrophy.  Left ventricular diastolic  parameters are indeterminate.   2. Right ventricular systolic function was not well visualized. The right  ventricular size is not well visualized.   3. The mitral valve is normal in structure. No evidence of mitral valve  regurgitation. No evidence of mitral stenosis.   4. The aortic valve was not well visualized. Aortic valve regurgitation  is not visualized.  Severe aortic valve stenosis. MG only , but  severe AS by AVA (0.8cm^2) and DI (0.21). Low SV index (25 cc/m^2),  suspect paradoxical low flow low gradient  severe AS   EKG Interpretation Date/Time:  Thursday November 16 2022 09:36:49 EDT Ventricular Rate:  54 PR Interval:  210 QRS Duration:  90 QT Interval:  458 QTC Calculation: 434 R Axis:   85  Text Interpretation: Sinus bradycardia with 1st degree A-V block Septal infarct (cited on or before 21-Feb-2021) When compared with ECG of 21-Feb-2021 16:09, Premature ventricular complexes are no longer Present T wave inversion no longer evident in Lateral leads Confirmed by Tonny Bollman [phone]) on 11/16/2022 9:47:34 AM   Cardiac Cath 02/07/2021: 1.  Severe native vessel coronary artery disease with chronic total occlusion of the LAD and severe stenosis of the RCA, unchanged from the previous study 2.  Status post CABG with continued patency of the LIMA to LAD, saphenous vein graft to diagonal, and saphenous vein graft to PDA 3.  Diffuse coronary artery  disease with moderate severe stenoses in the diagonal and distal LAD systems 4.  Minimal transaortic valve gradient with peak to peak gradient only 6 mmHg on pullback.  Aortic valve calculations are not done because of the lack of significant gradient. 5.  Acute on chronic systolic heart failure with pulmonary hypertension and elevated left and right heart pressures throughout.  Transpulmonary gradient is 25 mmHg, PVR approximately 6 Wood units   Recommend: Aggressive treatment of heart failure with diuresis and titration of medical therapy.  IV furosemide given post procedure.  Will start on furosemide 40 mg daily in addition to his current treatment.  We will arrange close follow-up to optimize guideline directed medical therapy for HFrEF.  Recent Labs: No results found for requested labs within last 365 days.  Recent Lipid Panel    Component Value Date/Time   CHOL 123 03/24/2021 0923   TRIG 85 03/24/2021 0923   HDL 41 03/24/2021 0923   CHOLHDL 3.0 03/24/2021 0923   CHOLHDL 3.5 09/18/2020 0023   VLDL 19 09/18/2020 0023   LDLCALC 65 03/24/2021 0923     Risk Assessment/Calculations:                Physical Exam:    VS:  BP 126/70   Pulse (!) 53   Ht 5\' 9"  (1.753 m)   Wt 202 lb 6.4 oz (91.8 kg)   SpO2 96%   BMI 29.89 kg/m     Wt Readings from Last 3 Encounters:  11/16/22 202 lb 6.4 oz (91.8 kg)  05/15/22 193 lb (87.5 kg)  11/02/21 193 lb 6.4 oz (87.7 kg)     GEN:  Well nourished, well developed in no acute distress HEENT: Normal NECK: No JVD; No carotid bruits LYMPHATICS: No lymphadenopathy CARDIAC: RRR, heart sounds are distant, 2-3/6 late peaking crescendo decrescendo murmur at the right upper sternal border and apex, A2 present but diminished RESPIRATORY:  Clear to auscultation without rales, wheezing or rhonchi  ABDOMEN: Soft, non-tender, non-distended MUSCULOSKELETAL:  No edema; No deformity  SKIN: Warm and dry NEUROLOGIC:  Alert and oriented x 3 PSYCHIATRIC:   Normal affect   ASSESSMENT:    1. Nonrheumatic aortic valve stenosis   2. Mixed hyperlipidemia   3. Essential hypertension   4. Coronary artery disease involving native coronary artery of native heart without angina pectoris   5. Heart failure with improved ejection fraction (HFimpEF) (HCC)    PLAN:    In order  of problems listed above:  The patient remains asymptomatic, he has stage C disease.  His echo is reviewed as outlined above and has characteristics of severe paradoxical low-flow low gradient aortic stenosis.  His LVEF is improved from previous, but remains in the low normal range at 50 to 55%.  His gradients clearly are lower than expected based on the morphology of his valve.  Considering his completely asymptomatic status, I think it is appropriate to follow him with close surveillance.  He will return in 6 months with an echocardiogram prior to that visit.  He understands to call in sooner if he has any problems. Treated with Zetia and rosuvastatin last LDL cholesterol was 26. Blood pressure is well-controlled on current regimen. Stable without symptoms of angina.  Relook cardiac catheterization after CABG demonstrated patency of his bypass grafts.  Patient on appropriate medical therapy LVEF improved from baseline.  In 2022 his LVEF was only 25 to 30%.  Markedly improved clinical status noted on a combination of candesartan, Jardiance, spironolactone.  No beta-blocker because of bradycardia and beta-blocker intolerance.  Continue current management.     Medication Adjustments/Labs and Tests Ordered: Current medicines are reviewed at length with the patient today.  Concerns regarding medicines are outlined above.  Orders Placed This Encounter  Procedures   EKG 12-Lead   ECHOCARDIOGRAM COMPLETE   No orders of the defined types were placed in this encounter.   Patient Instructions  Medication Instructions:  Your physician recommends that you continue on your current  medications as directed. Please refer to the Current Medication list given to you today.  *If you need a refill on your cardiac medications before your next appointment, please call your pharmacy*  Lab Work: NONE If you have labs (blood work) drawn today and your tests are completely normal, you will receive your results only by: MyChart Message (if you have MyChart) OR A paper copy in the mail If you have any lab test that is abnormal or we need to change your treatment, we will call you to review the results.  Testing/Procedures: ECHO (prior to visit in 6 months) Your physician has requested that you have an echocardiogram. Echocardiography is a painless test that uses sound waves to create images of your heart. It provides your doctor with information about the size and shape of your heart and how well your heart's chambers and valves are working. This procedure takes approximately one hour. There are no restrictions for this procedure. Please do NOT wear cologne, perfume, aftershave, or lotions (deodorant is allowed). Please arrive 15 minutes prior to your appointment time.  Follow-Up: At [facility], you and your health needs are our priority.  As part of our continuing mission to provide you with exceptional heart care, we have created designated Provider Care Teams.  These Care Teams include your primary Cardiologist (physician) and Advanced Practice Providers (APPs -  Physician Assistants and Nurse Practitioners) who all work together to provide you with the care you need, when you need it.  Your next appointment:   6 month(s)  Provider:   Tonny Bollman, MD     Signed, Tonny Bollman, MD  11/16/2022 1:06 PM    [facility]

## 2022-11-16 NOTE — Patient Instructions (Signed)
Medication Instructions:  Your physician recommends that you continue on your current medications as directed. Please refer to the Current Medication list given to you today.  *If you need a refill on your cardiac medications before your next appointment, please call your pharmacy*  Lab Work: NONE If you have labs (blood work) drawn today and your tests are completely normal, you will receive your results only by: MyChart Message (if you have MyChart) OR A paper copy in the mail If you have any lab test that is abnormal or we need to change your treatment, we will call you to review the results.  Testing/Procedures: ECHO (prior to visit in 6 months) Your physician has requested that you have an echocardiogram. Echocardiography is a painless test that uses sound waves to create images of your heart. It provides your doctor with information about the size and shape of your heart and how well your heart's chambers and valves are working. This procedure takes approximately one hour. There are no restrictions for this procedure. Please do NOT wear cologne, perfume, aftershave, or lotions (deodorant is allowed). Please arrive 15 minutes prior to your appointment time.  Follow-Up: At [facility], you and your health needs are our priority.  As part of our continuing mission to provide you with exceptional heart care, we have created designated Provider Care Teams.  These Care Teams include your primary Cardiologist (physician) and Advanced Practice Providers (APPs -  Physician Assistants and Nurse Practitioners) who all work together to provide you with the care you need, when you need it.  Your next appointment:   6 month(s)  Provider:   Tonny Bollman, MD

## 2022-11-21 ENCOUNTER — Ambulatory Visit (INDEPENDENT_AMBULATORY_CARE_PROVIDER_SITE_OTHER): Payer: Medicare Other

## 2022-11-21 DIAGNOSIS — I5022 Chronic systolic (congestive) heart failure: Secondary | ICD-10-CM

## 2022-11-21 DIAGNOSIS — I469 Cardiac arrest, cause unspecified: Secondary | ICD-10-CM | POA: Diagnosis not present

## 2022-11-21 LAB — CUP PACEART REMOTE DEVICE CHECK
Battery Remaining Longevity: 126 mo
Battery Voltage: 3.02 V
Brady Statistic RV Percent Paced: 0.24 %
Date Time Interrogation Session: 20240827001705
HighPow Impedance: 60 Ohm
Implantable Lead Connection Status: 753985
Implantable Lead Implant Date: 20221128
Implantable Lead Location: 753860
Implantable Lead Model: 6935
Implantable Pulse Generator Implant Date: 20221128
Lead Channel Impedance Value: 342 Ohm
Lead Channel Impedance Value: 399 Ohm
Lead Channel Pacing Threshold Amplitude: 0.625 V
Lead Channel Pacing Threshold Pulse Width: 0.4 ms
Lead Channel Sensing Intrinsic Amplitude: 16.875 mV
Lead Channel Sensing Intrinsic Amplitude: 16.875 mV
Lead Channel Setting Pacing Amplitude: 2 V
Lead Channel Setting Pacing Pulse Width: 0.4 ms
Lead Channel Setting Sensing Sensitivity: 0.3 mV
Zone Setting Status: 755011
Zone Setting Status: 755011

## 2022-11-25 ENCOUNTER — Other Ambulatory Visit: Payer: Self-pay | Admitting: Cardiovascular Disease

## 2022-12-05 NOTE — Progress Notes (Signed)
Remote ICD transmission.   

## 2022-12-28 DIAGNOSIS — Z23 Encounter for immunization: Secondary | ICD-10-CM | POA: Diagnosis not present

## 2023-01-06 ENCOUNTER — Other Ambulatory Visit: Payer: Self-pay | Admitting: Cardiovascular Disease

## 2023-01-11 DIAGNOSIS — Z95810 Presence of automatic (implantable) cardiac defibrillator: Secondary | ICD-10-CM | POA: Diagnosis not present

## 2023-01-11 DIAGNOSIS — I5040 Unspecified combined systolic (congestive) and diastolic (congestive) heart failure: Secondary | ICD-10-CM | POA: Diagnosis not present

## 2023-01-11 DIAGNOSIS — E1169 Type 2 diabetes mellitus with other specified complication: Secondary | ICD-10-CM | POA: Diagnosis not present

## 2023-01-11 DIAGNOSIS — E7800 Pure hypercholesterolemia, unspecified: Secondary | ICD-10-CM | POA: Diagnosis not present

## 2023-01-11 DIAGNOSIS — I350 Nonrheumatic aortic (valve) stenosis: Secondary | ICD-10-CM | POA: Diagnosis not present

## 2023-01-11 DIAGNOSIS — F419 Anxiety disorder, unspecified: Secondary | ICD-10-CM | POA: Diagnosis not present

## 2023-01-11 DIAGNOSIS — I2510 Atherosclerotic heart disease of native coronary artery without angina pectoris: Secondary | ICD-10-CM | POA: Diagnosis not present

## 2023-02-20 ENCOUNTER — Ambulatory Visit (INDEPENDENT_AMBULATORY_CARE_PROVIDER_SITE_OTHER): Payer: Medicare Other

## 2023-02-20 DIAGNOSIS — I469 Cardiac arrest, cause unspecified: Secondary | ICD-10-CM | POA: Diagnosis not present

## 2023-02-20 LAB — CUP PACEART REMOTE DEVICE CHECK
Battery Remaining Longevity: 124 mo
Battery Voltage: 3.02 V
Brady Statistic RV Percent Paced: 0.14 %
Date Time Interrogation Session: 20241126052606
HighPow Impedance: 63 Ohm
Implantable Lead Connection Status: 753985
Implantable Lead Implant Date: 20221128
Implantable Lead Location: 753860
Implantable Lead Model: 6935
Implantable Pulse Generator Implant Date: 20221128
Lead Channel Impedance Value: 304 Ohm
Lead Channel Impedance Value: 399 Ohm
Lead Channel Pacing Threshold Amplitude: 0.625 V
Lead Channel Pacing Threshold Pulse Width: 0.4 ms
Lead Channel Sensing Intrinsic Amplitude: 16.375 mV
Lead Channel Sensing Intrinsic Amplitude: 16.375 mV
Lead Channel Setting Pacing Amplitude: 2 V
Lead Channel Setting Pacing Pulse Width: 0.4 ms
Lead Channel Setting Sensing Sensitivity: 0.3 mV
Zone Setting Status: 755011
Zone Setting Status: 755011

## 2023-03-02 ENCOUNTER — Other Ambulatory Visit: Payer: Self-pay | Admitting: Cardiovascular Disease

## 2023-03-26 NOTE — Progress Notes (Signed)
Remote ICD transmission.   

## 2023-04-05 DIAGNOSIS — H26492 Other secondary cataract, left eye: Secondary | ICD-10-CM | POA: Diagnosis not present

## 2023-04-05 DIAGNOSIS — Z961 Presence of intraocular lens: Secondary | ICD-10-CM | POA: Diagnosis not present

## 2023-05-16 ENCOUNTER — Ambulatory Visit: Payer: Medicare Other | Admitting: Cardiovascular Disease

## 2023-05-21 ENCOUNTER — Ambulatory Visit (HOSPITAL_COMMUNITY): Payer: Medicare Other | Attending: Cardiovascular Disease

## 2023-05-21 DIAGNOSIS — E782 Mixed hyperlipidemia: Secondary | ICD-10-CM | POA: Diagnosis not present

## 2023-05-21 DIAGNOSIS — I2510 Atherosclerotic heart disease of native coronary artery without angina pectoris: Secondary | ICD-10-CM | POA: Insufficient documentation

## 2023-05-21 DIAGNOSIS — I10 Essential (primary) hypertension: Secondary | ICD-10-CM | POA: Diagnosis not present

## 2023-05-21 DIAGNOSIS — I5032 Chronic diastolic (congestive) heart failure: Secondary | ICD-10-CM | POA: Diagnosis not present

## 2023-05-21 DIAGNOSIS — I350 Nonrheumatic aortic (valve) stenosis: Secondary | ICD-10-CM | POA: Insufficient documentation

## 2023-05-21 LAB — ECHOCARDIOGRAM COMPLETE
AR max vel: 1.29 cm2
AV Area VTI: 1.27 cm2
AV Area mean vel: 1.2 cm2
AV Mean grad: 18.8 mm[Hg]
AV Peak grad: 34.7 mm[Hg]
Ao pk vel: 2.95 m/s
Area-P 1/2: 3.08 cm2
S' Lateral: 3.45 cm

## 2023-05-22 ENCOUNTER — Ambulatory Visit (INDEPENDENT_AMBULATORY_CARE_PROVIDER_SITE_OTHER): Payer: Medicare Other

## 2023-05-22 DIAGNOSIS — I469 Cardiac arrest, cause unspecified: Secondary | ICD-10-CM

## 2023-05-23 LAB — CUP PACEART REMOTE DEVICE CHECK
Battery Remaining Longevity: 122 mo
Battery Voltage: 3.02 V
Brady Statistic RV Percent Paced: 0.05 %
Date Time Interrogation Session: 20250225022824
HighPow Impedance: 62 Ohm
Implantable Lead Connection Status: 753985
Implantable Lead Implant Date: 20221128
Implantable Lead Location: 753860
Implantable Lead Model: 6935
Implantable Pulse Generator Implant Date: 20221128
Lead Channel Impedance Value: 342 Ohm
Lead Channel Impedance Value: 418 Ohm
Lead Channel Pacing Threshold Amplitude: 0.625 V
Lead Channel Pacing Threshold Pulse Width: 0.4 ms
Lead Channel Sensing Intrinsic Amplitude: 16.375 mV
Lead Channel Sensing Intrinsic Amplitude: 16.375 mV
Lead Channel Setting Pacing Amplitude: 2 V
Lead Channel Setting Pacing Pulse Width: 0.4 ms
Lead Channel Setting Sensing Sensitivity: 0.3 mV
Zone Setting Status: 755011
Zone Setting Status: 755011

## 2023-05-27 ENCOUNTER — Encounter: Payer: Self-pay | Admitting: Internal Medicine

## 2023-05-31 ENCOUNTER — Encounter (HOSPITAL_BASED_OUTPATIENT_CLINIC_OR_DEPARTMENT_OTHER): Payer: Self-pay

## 2023-06-01 ENCOUNTER — Encounter: Payer: Self-pay | Admitting: Cardiovascular Disease

## 2023-06-01 ENCOUNTER — Ambulatory Visit: Payer: Medicare Other | Attending: Cardiovascular Disease | Admitting: Cardiovascular Disease

## 2023-06-01 VITALS — BP 126/74 | HR 58 | Ht 70.0 in | Wt 196.8 lb

## 2023-06-01 DIAGNOSIS — E782 Mixed hyperlipidemia: Secondary | ICD-10-CM | POA: Diagnosis not present

## 2023-06-01 DIAGNOSIS — I5032 Chronic diastolic (congestive) heart failure: Secondary | ICD-10-CM | POA: Diagnosis not present

## 2023-06-01 DIAGNOSIS — I10 Essential (primary) hypertension: Secondary | ICD-10-CM | POA: Diagnosis not present

## 2023-06-01 DIAGNOSIS — I350 Nonrheumatic aortic (valve) stenosis: Secondary | ICD-10-CM | POA: Diagnosis not present

## 2023-06-01 NOTE — Progress Notes (Signed)
 Cardiology Office Note:    Date:  06/09/2023   ID:  Brian Robinson, DOB 07-21-1945, MRN [id number]  PCP:  Renford Dills, MD   [facility] Providers Cardiologist:  Nicki Guadalajara, MD     Referring MD: Renford Dills, MD   Chief Complaint  Patient presents with   Follow-up    Aortic stenosis    History of Present Illness:    Brian Robinson is a 78 y.o. male with a hx of CAD s/p CABG, and LFLG aortic stenosis, presenting for follow-up evaluation. He is here with his wife today. He initially presented with out of hospital cardiac arrest and was found to have severe MV CAD, ultimately treated with CABG. 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 He has done very well on GDMT.   Today, he denies symptoms of palpitations, chest pain, shortness of breath, orthopnea, PND, lower extremity edema, dizziness, or syncope. He has no concerns regarding his heart condition today and feels that he is doing well in this regard.   Current Medications: Current Meds  Medication Sig   acetaminophen (TYLENOL) 500 MG tablet Take 2 tablets (1,000 mg total) by mouth every 6 (six) hours as needed.   ALPRAZolam (XANAX) 0.25 MG tablet Take 0.25 mg by mouth daily as needed for anxiety.   aspirin 81 MG EC tablet Take 1 tablet (81 mg total) by mouth daily. (Patient taking differently: Take 81 mg by mouth daily. HOLD FOR 4 WEEKS DUE TO HEMATOMA AT ICD SITE)   candesartan (ATACAND) 16 MG tablet Take 16 mg by mouth daily.   cetirizine (ZYRTEC) 10 MG tablet Take 10 mg by mouth as needed.   diphenhydrAMINE (BENADRYL) 25 mg capsule Take 25 mg by mouth every 6 (six) hours as needed for allergies or sleep.   empagliflozin (JARDIANCE) 10 MG TABS tablet Take 1 tablet (10 mg total) by mouth daily before breakfast.   ezetimibe (ZETIA) 10 MG tablet TAKE 1 TABLET BY MOUTH EVERY DAY   ferrous sulfate 325 (65 FE) MG tablet Take 325 mg by mouth daily with breakfast.   metFORMIN  (GLUCOPHAGE) 500 MG tablet Take 500 mg by mouth 2 (two) times daily with a meal.   Multiple Vitamin (MULTI VITAMIN DAILY PO) Take 1 tablet by mouth daily.   Multiple Vitamins-Minerals (PRESERVISION AREDS 2) CAPS Take 1 capsule by mouth 2 (two) times daily.   rosuvastatin (CRESTOR) 20 MG tablet TAKE 1 TABLET BY MOUTH EVERY DAY   sertraline (ZOLOFT) 50 MG tablet Take 50 mg by mouth in the morning.   spironolactone (ALDACTONE) 25 MG tablet TAKE 1/2 TABLET BY MOUTH EVERY DAY     Allergies:   Entresto [sacubitril-valsartan]   ROS:   Please see the history of present illness.    All other systems reviewed and are negative.  EKGs/Labs/Other Studies Reviewed:    The following studies were reviewed today: Cardiac Studies & Procedures   ______________________________________________________________________________________________ CARDIAC CATHETERIZATION  CARDIAC CATHETERIZATION 02/07/2021  Narrative   Ost LAD to Prox LAD lesion is 75% stenosed.   Prox LAD lesion is 100% stenosed.   Prox Cx lesion is 40% stenosed.   Ost RCA lesion is 75% stenosed.   Dist RCA lesion is 90% stenosed.   Ramus lesion is 75% stenosed.   1st Diag-1 lesion is 75% stenosed.   3rd Mrg lesion is 30% stenosed.   Mid LAD to Dist LAD lesion is 75% stenosed.   1st Diag-2 lesion is 80%  stenosed.   SVG and is normal in caliber.   LIMA graft was visualized by angiography and is normal in caliber.   The graft exhibits no disease.   The graft exhibits no disease.  1.  Severe native vessel coronary artery disease with chronic total occlusion of the LAD and severe stenosis of the RCA, unchanged from the previous study 2.  Status post CABG with continued patency of the LIMA to LAD, saphenous vein graft to diagonal, and saphenous vein graft to PDA 3.  Diffuse coronary artery disease with moderate severe stenoses in the diagonal and distal LAD systems 4.  Minimal transaortic valve gradient with peak to peak gradient only 6  mmHg on pullback.  Aortic valve calculations are not done because of the lack of significant gradient. 5.  Acute on chronic systolic heart failure with pulmonary hypertension and elevated left and right heart pressures throughout.  Transpulmonary gradient is 25 mmHg, PVR approximately 6 Wood units  Recommend: Aggressive treatment of heart failure with diuresis and titration of medical therapy.  IV furosemide given post procedure.  Will start on furosemide 40 mg daily in addition to his current treatment.  We will arrange close follow-up to optimize guideline directed medical therapy for HFrEF.  Findings Coronary Findings Diagnostic  Dominance: Right  Left Main Vessel is large.  Left Anterior Descending Vessel is large. Ost LAD to Prox LAD lesion is 75% stenosed. Prox LAD lesion is 100% stenosed. The lesion is chronically occluded. Mid LAD to Dist LAD lesion is 75% stenosed. The lesion is segmental. The lesion is calcified.  First Diagonal Branch Vessel is large in size. 1st Diag-1 lesion is 75% stenosed. 1st Diag-2 lesion is 80% stenosed. Small caliber, sub-branch disease  Ramus Intermedius Vessel is moderate in size. Ramus lesion is 75% stenosed.  Left Circumflex Vessel is large. The left main and left circumflex are both patent without significant obstructive disease.  Unchanged from prior angiography. Prox Cx lesion is 40% stenosed.  First Obtuse Marginal Branch Vessel is small in size.  Second Obtuse Marginal Branch Vessel is large in size.  Third Obtuse Marginal Branch Vessel is large in size. 3rd Mrg lesion is 30% stenosed.  Right Coronary Artery The RCA has known severe disease and is not selectively injected.  It is seen to fill retrograde from the saphenous vein graft injections Ost RCA lesion is 75% stenosed. The lesion is calcified. Dist RCA lesion is 90% stenosed.  Right Posterior Descending Artery Vessel is moderate in size.  Right Posterior  Atrioventricular Artery Vessel is moderate in size.  Saphenous Graft To 1st Diag SVG and is normal in caliber.  The graft exhibits no disease. The SVG to diagonal was widely patent.  LIMA LIMA Graft To Mid LAD LIMA graft was visualized by angiography and is normal in caliber.  The graft exhibits no disease. The LIMA to LAD is widely patent with diffuse stenosis in the LAD after the graft insertion site.  Graft To RPDA The SVG to PDA is widely patent and fills the entire distal RCA circulation.  Intervention  No interventions have been documented.   CARDIAC CATHETERIZATION  CARDIAC CATHETERIZATION 09/20/2020  Narrative Conclusions: 1. Severe three-vessel coronary artery disease, including chronic total occlusion of proximal LAD with left-to-left collaterals, 75% ostial/proximal ramus intermedius stenosis, and sequential 75% ostial and 90% distal RCA lesions.  LCx and OM3 have mild to moderate disease of up to 40%. 2. Moderately elevated left ventricular filling pressure (LVEDP ~30 mmHg). 3. Mild aortic  stenosis (peak-to-peak gradient 10-15 mmHg.  Recommendations: 1. Imaged reviewed with Dr. Excell Seltzer during the procedure.  We will plan for cardiac surgery consultation for CABG given severe LAD and RCA disease with good distal targets in both vessels. 2. Defer reinitiation of IV heparin, as the coronary artery disease appears chronic and hemoglobin has trended down significantly since admission. 3. Aggressive secondary prevention. 4. Diurese with furosemide 40 mg IV x 1.  Further diureses to be based on urine output, volume status, and renal function.  Yvonne Kendall, MD [facility]  Findings Coronary Findings Diagnostic  Dominance: Right  Left Main Vessel is large.  Left Anterior Descending Vessel is large. Ost LAD to Prox LAD lesion is 75% stenosed. Prox LAD lesion is 100% stenosed. The lesion is chronically occluded.  First Diagonal Branch Vessel is large in  size. 1st Diag lesion is 75% stenosed.  Ramus Intermedius Vessel is moderate in size. Ramus lesion is 75% stenosed.  Left Circumflex Vessel is large. Prox Cx lesion is 40% stenosed.  First Obtuse Marginal Branch Vessel is small in size.  Second Obtuse Marginal Branch Vessel is large in size.  Third Obtuse Marginal Branch Vessel is large in size. 3rd Mrg lesion is 30% stenosed.  Right Coronary Artery Ost RCA lesion is 75% stenosed. The lesion is calcified. Dist RCA lesion is 90% stenosed.  Right Posterior Descending Artery Vessel is moderate in size.  Right Posterior Atrioventricular Artery Vessel is moderate in size.  Intervention  No interventions have been documented.     ECHOCARDIOGRAM  ECHOCARDIOGRAM COMPLETE 05/21/2023  Narrative ECHOCARDIOGRAM REPORT    Patient Name:   Brian Robinson Date of Exam: 05/21/2023 Medical Rec #:  [id number]      Height:       69.0 in Accession #:    [id number]     Weight:       202.4 lb Date of Birth:  [birth year]       BSA:          2.077 m Patient Age:    77 years       BP:           126/70 mmHg Patient Gender: M              HR:           50 bpm. Exam Location:  [facility]  Procedure: Cardiac Doppler, Color Doppler and 2D Echo (Both Spectral and Color Flow Doppler were utilized during procedure).  Indications:     Nonrheumatic aortic (valve) stenosis I35.0  History:         Patient has prior history of Echocardiogram examinations, most recent 11/13/2022. CAD, Prior CABG, Defibrillator and Prior Cardiac Surgery, Aortic Valve Disease, Signs/Symptoms:Murmur; Risk Factors:Hypertension, Diabetes and Dyslipidemia. Prior EF 50-55%, Aortic Stenosis.  Sonographer:     Farrel Conners RDCS Referring Phys:  Windy Fast POLITE Diagnosing Phys: Epifanio Lesches MD   Sonographer Comments: Patient declined an IV for Definity. IMPRESSIONS   1. Technically diffucult study with limited views. LV is poorly visualized, appears  grossly normal systolic function but difficult to evaluate without contrast 2. Right ventricular systolic function was not well visualized. The right ventricular size is not well visualized. There is normal pulmonary artery systolic pressure. The estimated right ventricular systolic pressure is 28.8 mmHg. 3. Left atrial size was moderately dilated. 4. Right atrial size was mildly dilated. 5. The mitral valve is normal in structure. Trivial mitral valve regurgitation. No evidence of mitral stenosis. 6. The inferior  vena cava is normal in size with greater than 50% respiratory variability, suggesting right atrial pressure of 3 mmHg. 7. The aortic valve was not well visualized. Aortic valve regurgitation is not visualized. Severe aortic valve stenosis. Moderate AS by gradients (Vmax 3.1 m/s, MG 21 mmHg), severe by AVA (0.9 cm^2) and DI (0.23). Low SV index (33 cc/m^2). Consistent with paradoxical low flow low gradient severe AS  FINDINGS Left Ventricle: Left ventricular ejection fraction, by estimation, is 50 to 55%. The left ventricle has low normal function. Left ventricular endocardial border not optimally defined to evaluate regional wall motion. Strain imaging was not performed. The left ventricular internal cavity size was normal in size. There is no left ventricular hypertrophy. Left ventricular diastolic parameters are indeterminate. Elevated left atrial pressure.  Right Ventricle: The right ventricular size is not well visualized. Right vetricular wall thickness was not well visualized. Right ventricular systolic function was not well visualized. There is normal pulmonary artery systolic pressure. The tricuspid regurgitant velocity is 2.54 m/s, and with an assumed right atrial pressure of 3 mmHg, the estimated right ventricular systolic pressure is 28.8 mmHg.  Left Atrium: Left atrial size was moderately dilated.  Right Atrium: Right atrial size was mildly dilated.  Pericardium: There is no  evidence of pericardial effusion.  Mitral Valve: The mitral valve is normal in structure. Trivial mitral valve regurgitation. No evidence of mitral valve stenosis.  Tricuspid Valve: The tricuspid valve is normal in structure. Tricuspid valve regurgitation is trivial.  Aortic Valve: The aortic valve was not well visualized. Aortic valve regurgitation is not visualized. Severe aortic stenosis is present. Aortic valve mean gradient measures 18.8 mmHg. Aortic valve peak gradient measures 34.7 mmHg. Aortic valve area, by VTI measures 1.27 cm.  Pulmonic Valve: The pulmonic valve was not well visualized. Pulmonic valve regurgitation is not visualized.  Aorta: The aortic root and ascending aorta are structurally normal, with no evidence of dilitation.  Venous: The inferior vena cava is normal in size with greater than 50% respiratory variability, suggesting right atrial pressure of 3 mmHg.  IAS/Shunts: The interatrial septum was not well visualized.  Additional Comments: 3D imaging was not performed.   LEFT VENTRICLE PLAX 2D LVIDd:         4.50 cm   Diastology LVIDs:         3.45 cm   LV e' medial:    4.79 cm/s LV PW:         0.75 cm   LV E/e' medial:  20.6 LV IVS:        0.90 cm   LV e' lateral:   10.00 cm/s LVOT diam:     2.60 cm   LV E/e' lateral: 9.8 LV SV:         95 LV SV Index:   46 LVOT Area:     5.31 cm   RIGHT VENTRICLE RV Basal diam:  4.30 cm RV Mid diam:    3.50 cm RV S prime:     7.07 cm/s TAPSE (M-mode): 1.7 cm RVSP:           28.8 mmHg  LEFT ATRIUM              Index        RIGHT ATRIUM           Index LA diam:        3.90 cm  1.88 cm/m   RA Pressure: 3.00 mmHg LA Vol (A2C):   102.0 ml 49.12 ml/m  RA Area:     25.50 cm LA Vol (A4C):   71.6 ml  34.48 ml/m  RA Volume:   83.70 ml  40.31 ml/m LA Biplane Vol: 91.4 ml  44.02 ml/m AORTIC VALVE AV Area (Vmax):    1.29 cm AV Area (Vmean):   1.20 cm AV Area (VTI):     1.27 cm AV Vmax:           294.60 cm/s AV  Vmean:          201.800 cm/s AV VTI:            0.746 m AV Peak Grad:      34.7 mmHg AV Mean Grad:      18.8 mmHg LVOT Vmax:         71.80 cm/s LVOT Vmean:        45.550 cm/s LVOT VTI:          0.179 m LVOT/AV VTI ratio: 0.24  AORTA Ao Root diam: 3.60 cm Ao Asc diam:  3.70 cm  MITRAL VALVE               TRICUSPID VALVE MV Area (PHT): cm         TR Peak grad:   25.8 mmHg MV Decel Time: 246 msec    TR Vmax:        254.00 cm/s MV E velocity: 98.50 cm/s  Estimated RAP:  3.00 mmHg MV A velocity: 62.40 cm/s  RVSP:           28.8 mmHg MV E/A ratio:  1.58 SHUNTS Systemic VTI:  0.18 m Systemic Diam: 2.60 cm  Epifanio Lesches MD Electronically signed by Epifanio Lesches MD Signature Date/Time: 05/21/2023/3:13:26 PM    Final (Updated)   TEE  ECHO INTRAOPERATIVE TEE 09/23/2020  Narrative *INTRAOPERATIVE TRANSESOPHAGEAL REPORT *    Patient Name:   Brian Robinson Date of Exam: 09/23/2020 Medical Rec #:  [id number]      Height:       66.0 in Accession #:    [id number]     Weight:       190.2 lb Date of Birth:  09/23/1945       BSA:          1.96 m Patient Age:    75 years       BP:           115/70 mmHg Patient Gender: M              HR:           84 bpm. Exam Location:  Anesthesiology  Transesophogeal exam was perform intraoperatively during surgical procedure. Patient was closely monitored under general anesthesia during the entirety of examination.  Indications:     CAD Sonographer:     Lavenia Atlas RDCS Performing Phys: [id number] HENDRICKSON Diagnosing Phys: Marcene Duos MD  Complications: No known complications during this procedure. POST-OP IMPRESSIONS - Left Ventricle: has moderately reduced systolic function, with an ejection fraction of 40-45%%. - Right Ventricle: The right ventricle appears unchanged from pre-bypass. - Aorta: The aorta appears unchanged from pre-bypass. - Left Atrium: The left atrium appears unchanged from pre-bypass. -  Left Atrial Appendage: The left atrial appendage appears unchanged from pre-bypass. - Aortic Valve: The aortic valve appears unchanged from pre-bypass. - Mitral Valve: The mitral valve appears unchanged from pre-bypass. - Tricuspid Valve: The tricuspid valve appears unchanged from pre-bypass. - Pulmonic Valve: The pulmonic valve appears unchanged from pre-bypass. - Interatrial  Septum: The interatrial septum appears unchanged from pre-bypass. - Interventricular Septum: The interventricular septum appears unchanged from pre-bypass. - Pericardium: The pericardium appears unchanged from pre-bypass.  PRE-OP FINDINGS Left Ventricle: The left ventricle has moderately reduced systolic function, with an ejection fraction of 35-40%. The cavity size was normal. Moderate hypokinesis of the left ventricular, mid anterior wall, inferior wall, lateral wall, anterolateral wall, inferolateral wall and apical segment. Severe hypokinesis of the left ventricular, mid septal wall, anteroseptal wall and inferoseptal wall. There is no left ventricular hypertrophy.   Right Ventricle: The right ventricle has normal systolic function. The cavity was dialated. There is no increase in right ventricular wall thickness.  Left Atrium: Left atrial size was normal in size. No left atrial/left atrial appendage thrombus was detected.  Right Atrium: Right atrial size was normal in size.  Interatrial Septum: The interatrial septum was not assessed.  Pericardium: There is no evidence of pericardial effusion.  Mitral Valve: The mitral valve is normal in structure. Mitral valve regurgitation is trivial by color flow Doppler. There is No evidence of mitral stenosis.  Tricuspid Valve: The tricuspid valve was normal in structure. Tricuspid valve regurgitation was not visualized by color flow Doppler.  Aortic Valve: The aortic valve is tricuspid Aortic valve regurgitation was not visualized by color flow Doppler. There is mild  stenosis of the aortic valve. There is moderate thickening and mild calcification present on the aortic valve right coronary and left coronary cusps with moderately decreased mobility and there is mild thickening and mild calcification present on the aortic valve non-coronary cusp with moderately decreased mobility.  Pulmonic Valve: The pulmonic valve was normal in structure. Pulmonic valve regurgitation is not visualized by color flow Doppler.   Aorta: The aortic root, ascending aorta and aortic arch are normal in size and structure. There is evidence of plaque in the descending aorta.  +-------------+------------++ AORTIC VALVE              +-------------+------------++ AV Vmax:     225.00 cm/s  +-------------+------------++ AV Vmean:    155.000 cm/s +-------------+------------++ AV VTI:      0.506 m      +-------------+------------++ AV Peak Grad:20.2 mmHg    +-------------+------------++ AV Mean Grad:11.5 mmHg    +-------------+------------++   Marcene Duos MD Electronically signed by Marcene Duos MD Signature Date/Time: 09/24/2020/1:41:24 PM    Final  MONITORS  CARDIAC EVENT MONITOR 12/03/2020  Narrative 1. The basic rhythm is normal sinus with an average HR of 70 bpm 2. No atrial fibrillation or flutter 3. No high-grade heart block or pathologic pauses 4. There are freqent PVC's (7% burden) and occasional (2%) supraventricular beats without sustained arrhythmias       ______________________________________________________________________________________________      EKG:        Recent Labs: No results found for requested labs within last 365 days.  Recent Lipid Panel    Component Value Date/Time   CHOL 123 03/24/2021 0923   TRIG 85 03/24/2021 0923   HDL 41 03/24/2021 0923   CHOLHDL 3.0 03/24/2021 0923   CHOLHDL 3.5 09/18/2020 0023   VLDL 19 09/18/2020 0023   LDLCALC 65 03/24/2021 0923         Physical Exam:     VS:  BP 126/74   Pulse (!) 58   Ht 5\' 10"  (1.778 m)   Wt 196 lb 12.8 oz (89.3 kg)   SpO2 99%   BMI 28.24 kg/m     Wt Readings from Last 3 Encounters:  06/01/23 196 lb 12.8 oz (89.3 kg)  11/16/22 202 lb 6.4 oz (91.8 kg)  05/15/22 193 lb (87.5 kg)     GEN:  Well nourished, well developed in no acute distress HEENT: Normal NECK: No JVD; No carotid bruits LYMPHATICS: No lymphadenopathy CARDIAC: RRR, 2/6 crescendo decrescendo murmur at the RUSB RESPIRATORY:  Clear to auscultation without rales, wheezing or rhonchi  ABDOMEN: Soft, non-tender, non-distended MUSCULOSKELETAL:  No edema; No deformity  SKIN: Warm and dry NEUROLOGIC:  Alert and oriented x 3 PSYCHIATRIC:  Normal affect   Assessment & Plan Nonrheumatic aortic valve stenosis The patient has moderately severe low flow low gradient aortic stenosis. His recent echo shows normal LV and RV function, trivial MR, and severe AS with mean gradient 21 mmHg, AVA 0.9 square cm, DI 0.23, and severely calcified restricted leaflet mobility. We discussed treatment options in the context of his asymptomatic clinical status. We agree it is appropriate to continue with surveillance as he hasn't had significant progression over serial echo studies and remains asymptomatic. LVEF has remained stable in the range of 50-55% over serial studies. Heart failure with improved ejection fraction (HFimpEF) (HCC) Stable on GDMT with candesartan, jardiance, and spironolactone Essential hypertension BP well-controlled on current Rx. Mixed hyperlipidemia Rosuvastatin 20 mg daily. LDL goal < 55 mg/dL.             Medication Adjustments/Labs and Tests Ordered: Current medicines are reviewed at length with the patient today.  Concerns regarding medicines are outlined above.  Orders Placed This Encounter  Procedures   ECHOCARDIOGRAM COMPLETE   No orders of the defined types were placed in this encounter.   Patient Instructions   Testing/Procedures: ECHO (next year, prior to visit w/Shandreka Dante) Your physician has requested that you have an echocardiogram. Echocardiography is a painless test that uses sound waves to create images of your heart. It provides your doctor with information about the size and shape of your heart and how well your heart's chambers and valves are working. This procedure takes approximately one hour. There are no restrictions for this procedure. Please do NOT wear cologne, perfume, aftershave, or lotions (deodorant is allowed). Please arrive 15 minutes prior to your appointment time.  Please note: We ask at that you not bring children with you during ultrasound (echo/ vascular) testing. Due to room size and safety concerns, children are not allowed in the ultrasound rooms during exams. Our front office staff cannot provide observation of children in our lobby area while testing is being conducted. An adult accompanying a patient to their appointment will only be allowed in the ultrasound room at the discretion of the ultrasound technician under special circumstances. We apologize for any inconvenience.  Follow-Up: At [facility], you and your health needs are our priority.  As part of our continuing mission to provide you with exceptional heart care, we have created designated Provider Care Teams.  These Care Teams include your primary Cardiologist (physician) and Advanced Practice Providers (APPs -  Physician Assistants and Nurse Practitioners) who all work together to provide you with the care you need, when you need it.  Your next appointment:   6 month(s)  Provider:   APP, then Dr Excell Seltzer in 1 year   1st Floor: - Lobby - Registration  - Pharmacy  - Lab - Cafe  2nd Floor: - PV Lab - Diagnostic Testing (echo, CT, nuclear med)  3rd Floor: - Vacant  4th Floor: - TCTS (cardiothoracic surgery) - AFib Clinic - Structural Heart Clinic -  Vascular Surgery  - Vascular  Ultrasound  5th Floor: - [facility] Cardiology (general and EP) - Clinical Pharmacy for coumadin, hypertension, lipid, weight-loss medications, and med management appointments    Valet parking services will be available as well.     Signed, Tonny Bollman, MD  06/09/2023 7:34 AM    [facility]

## 2023-06-01 NOTE — Patient Instructions (Signed)
 Testing/Procedures: ECHO (next year, prior to visit w/[facility]) Your physician has requested that you have an echocardiogram. Echocardiography is a painless test that uses sound waves to create images of your heart. It provides your doctor with information about the size and shape of your heart and how well your heart's chambers and valves are working. This procedure takes approximately one hour. There are no restrictions for this procedure. Please do NOT wear cologne, perfume, aftershave, or lotions (deodorant is allowed). Please arrive 15 minutes prior to your appointment time.  Please note: We ask at that you not bring children with you during ultrasound (echo/ vascular) testing. Due to room size and safety concerns, children are not allowed in the ultrasound rooms during exams. Our front office staff cannot provide observation of children in our lobby area while testing is being conducted. An adult accompanying a patient to their appointment will only be allowed in the ultrasound room at the discretion of the ultrasound technician under special circumstances. We apologize for any inconvenience.  Follow-Up: At [facility], you and your health needs are our priority.  As part of our continuing mission to provide you with exceptional heart care, we have created designated Provider Care Teams.  These Care Teams include your primary Cardiologist (physician) and Advanced Practice Providers (APPs -  Physician Assistants and Nurse Practitioners) who all work together to provide you with the care you need, when you need it.  Your next appointment:   6 month(s)  Provider:   APP, then Dr Excell Seltzer in 1 year   1st Floor: - Lobby - Registration  - Pharmacy  - Lab - Cafe  2nd Floor: - PV Lab - Diagnostic Testing (echo, CT, nuclear med)  3rd Floor: - Vacant  4th Floor: - TCTS (cardiothoracic surgery) - AFib Clinic - Structural Heart Clinic - Vascular Surgery  - Vascular  Ultrasound  5th Floor: - HeartCare Cardiology (general and EP) - Clinical Pharmacy for coumadin, hypertension, lipid, weight-loss medications, and med management appointments    Valet parking services will be available as well.

## 2023-06-09 ENCOUNTER — Encounter: Payer: Self-pay | Admitting: Cardiovascular Disease

## 2023-06-09 NOTE — Assessment & Plan Note (Signed)
 Rosuvastatin 20 mg daily. LDL goal < 55 mg/dL.

## 2023-06-27 NOTE — Progress Notes (Signed)
 Remote ICD transmission.

## 2023-06-27 NOTE — Addendum Note (Signed)
 Addended by: Elease Etienne A on: 06/27/2023 02:20 PM   Modules accepted: Orders

## 2023-07-12 ENCOUNTER — Other Ambulatory Visit: Payer: Self-pay | Admitting: Cardiovascular Disease

## 2023-08-16 DIAGNOSIS — I2510 Atherosclerotic heart disease of native coronary artery without angina pectoris: Secondary | ICD-10-CM | POA: Diagnosis not present

## 2023-08-16 DIAGNOSIS — Z23 Encounter for immunization: Secondary | ICD-10-CM | POA: Diagnosis not present

## 2023-08-16 DIAGNOSIS — I5040 Unspecified combined systolic (congestive) and diastolic (congestive) heart failure: Secondary | ICD-10-CM | POA: Diagnosis not present

## 2023-08-16 DIAGNOSIS — Z0000 Encounter for general adult medical examination without abnormal findings: Secondary | ICD-10-CM | POA: Diagnosis not present

## 2023-08-16 DIAGNOSIS — I350 Nonrheumatic aortic (valve) stenosis: Secondary | ICD-10-CM | POA: Diagnosis not present

## 2023-08-16 DIAGNOSIS — E1169 Type 2 diabetes mellitus with other specified complication: Secondary | ICD-10-CM | POA: Diagnosis not present

## 2023-08-16 DIAGNOSIS — Z95810 Presence of automatic (implantable) cardiac defibrillator: Secondary | ICD-10-CM | POA: Diagnosis not present

## 2023-08-16 DIAGNOSIS — F419 Anxiety disorder, unspecified: Secondary | ICD-10-CM | POA: Diagnosis not present

## 2023-08-16 DIAGNOSIS — E7800 Pure hypercholesterolemia, unspecified: Secondary | ICD-10-CM | POA: Diagnosis not present

## 2023-08-21 ENCOUNTER — Ambulatory Visit: Payer: Medicare Other

## 2023-08-21 DIAGNOSIS — I469 Cardiac arrest, cause unspecified: Secondary | ICD-10-CM | POA: Diagnosis not present

## 2023-08-22 LAB — CUP PACEART REMOTE DEVICE CHECK
Battery Remaining Longevity: 120 mo
Battery Voltage: 3.01 V
Brady Statistic RV Percent Paced: 0.08 %
Date Time Interrogation Session: 20250527044222
HighPow Impedance: 58 Ohm
Implantable Lead Connection Status: 753985
Implantable Lead Implant Date: 20221128
Implantable Lead Location: 753860
Implantable Lead Model: 6935
Implantable Pulse Generator Implant Date: 20221128
Lead Channel Impedance Value: 304 Ohm
Lead Channel Impedance Value: 399 Ohm
Lead Channel Pacing Threshold Amplitude: 0.625 V
Lead Channel Pacing Threshold Pulse Width: 0.4 ms
Lead Channel Sensing Intrinsic Amplitude: 18.375 mV
Lead Channel Sensing Intrinsic Amplitude: 18.375 mV
Lead Channel Setting Pacing Amplitude: 2 V
Lead Channel Setting Pacing Pulse Width: 0.4 ms
Lead Channel Setting Sensing Sensitivity: 0.3 mV
Zone Setting Status: 755011
Zone Setting Status: 755011

## 2023-08-24 ENCOUNTER — Ambulatory Visit: Payer: Self-pay | Admitting: Internal Medicine

## 2023-09-03 DIAGNOSIS — H35033 Hypertensive retinopathy, bilateral: Secondary | ICD-10-CM | POA: Diagnosis not present

## 2023-09-03 DIAGNOSIS — H35371 Puckering of macula, right eye: Secondary | ICD-10-CM | POA: Diagnosis not present

## 2023-09-03 DIAGNOSIS — E119 Type 2 diabetes mellitus without complications: Secondary | ICD-10-CM | POA: Diagnosis not present

## 2023-09-03 DIAGNOSIS — H40013 Open angle with borderline findings, low risk, bilateral: Secondary | ICD-10-CM | POA: Diagnosis not present

## 2023-10-11 NOTE — Addendum Note (Signed)
 Addended by: TAWNI DRILLING D on: 10/11/2023 03:13 PM   Modules accepted: Orders

## 2023-10-11 NOTE — Progress Notes (Signed)
 Remote ICD transmission.

## 2023-11-16 ENCOUNTER — Telehealth: Payer: Self-pay | Admitting: Cardiovascular Disease

## 2023-11-16 MED ORDER — EZETIMIBE 10 MG PO TABS: 10.0000 mg | ORAL_TABLET | Freq: Every day | ORAL | 1 refills | Status: AC

## 2023-11-16 NOTE — Telephone Encounter (Signed)
 Pt's medication was sent to pt's pharmacy as requested. Confirmation received.

## 2023-11-16 NOTE — Telephone Encounter (Signed)
*  STAT* If patient is at the pharmacy, call can be transferred to refill team.   1. Which medications need to be refilled? (please list name of each medication and dose if known)   ezetimibe  (ZETIA ) 10 MG tablet   2. Would you like to learn more about the convenience, safety, & potential cost savings by using the [facility]?   3. Are you open to using the [facility] (Type [facility]. ).  4. Which pharmacy/location (including street and city if local pharmacy) is medication to be sent to?  [id number] - [place], [place] - [address] AT [address] OF [address]   5. Do they need a 30 day or 90 day supply?   90 day  Wife (Ginger) stated patient still has some medication.

## 2023-11-20 ENCOUNTER — Ambulatory Visit (INDEPENDENT_AMBULATORY_CARE_PROVIDER_SITE_OTHER): Payer: Medicare Other

## 2023-11-20 DIAGNOSIS — I469 Cardiac arrest, cause unspecified: Secondary | ICD-10-CM | POA: Diagnosis not present

## 2023-11-20 NOTE — Progress Notes (Unsigned)
  Electrophysiology Office Note:   Date:  11/21/2023  ID:  Brian Robinson, DOB 1945-07-13, MRN [id number]  Primary Cardiologist: Debby Sor, MD (Inactive) Primary Heart Failure: None Electrophysiologist: Danelle Birmingham, MD       History of Present Illness:   Brian Coury ([place]) is a 78 y.o. male with h/o HFrEF, CAD s/p CABG, VF arrest s/p ICD, HTN, severe AS, HLD, DM II, anxiety seen today for routine electrophysiology followup.   Since last being seen in our clinic the patient reports doing well overall. He only gets tired if he works out in the yard strenuously. He reports he has a valve that is messed up and Dr. Wonda is monitoring it. No device related concerns.     He denies chest pain, palpitations, dyspnea, PND, orthopnea, nausea, vomiting, dizziness, syncope, edema, weight gain, or early satiety.   Review of systems complete and found to be negative unless listed in HPI.   EP Information / Studies Reviewed:    EKG is ordered today. Personal review as below.  EKG Interpretation Date/Time:  Wednesday November 21 2023 13:55:43 EDT Ventricular Rate:  55 PR Interval:  204 QRS Duration:  86 QT Interval:  440 QTC Calculation: 420 R Axis:   43  Text Interpretation: Sinus bradycardia Confirmed by Aniceto Jarvis ([id number]) on 11/21/2023 2:05:14 PM   ICD Interrogation-  reviewed in detail today,  See PACEART report.  Device History: Medtronic Single Chamber ICD implanted 02/21/21 for HFrEF History of appropriate therapy: No History of AAD therapy: No   Risk Assessment/Calculations:              Physical Exam:   VS:  BP 110/70   Pulse (!) 55   Ht 5' 10 (1.778 m)   Wt 197 lb 12.8 oz (89.7 kg)   SpO2 99%   BMI 28.38 kg/m    Wt Readings from Last 3 Encounters:  11/21/23 197 lb 12.8 oz (89.7 kg)  06/01/23 196 lb 12.8 oz (89.3 kg)  11/16/22 202 lb 6.4 oz (91.8 kg)     GEN: Well nourished, well developed in no acute distress NECK: No JVD; No carotid bruits CARDIAC:  Regular rate and rhythm, no murmurs, rubs, gallops, device site wnl / no tethering  RESPIRATORY:  Clear to auscultation without rales, wheezing or rhonchi  ABDOMEN: Soft, non-tender, non-distended EXTREMITIES:  No edema; No deformity   ASSESSMENT AND PLAN:    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 Birmingham / EP APP in 12 months   Signed, Jarvis Aniceto, NP-C, AGACNP-BC [facility] - Electrophysiology  11/21/2023, 2:31 PM

## 2023-11-21 ENCOUNTER — Ambulatory Visit: Attending: Pulmonary Disease | Admitting: Pulmonary Disease

## 2023-11-21 ENCOUNTER — Ambulatory Visit: Admitting: Internal Medicine

## 2023-11-21 ENCOUNTER — Encounter: Payer: Self-pay | Admitting: Pulmonary Disease

## 2023-11-21 VITALS — BP 110/70 | HR 55 | Ht 70.0 in | Wt 197.8 lb

## 2023-11-21 DIAGNOSIS — I469 Cardiac arrest, cause unspecified: Secondary | ICD-10-CM | POA: Diagnosis not present

## 2023-11-21 DIAGNOSIS — Z95810 Presence of automatic (implantable) cardiac defibrillator: Secondary | ICD-10-CM | POA: Diagnosis not present

## 2023-11-21 DIAGNOSIS — I5022 Chronic systolic (congestive) heart failure: Secondary | ICD-10-CM | POA: Insufficient documentation

## 2023-11-21 LAB — CUP PACEART REMOTE DEVICE CHECK
Battery Remaining Longevity: 118 mo
Battery Voltage: 3.01 V
Brady Statistic RV Percent Paced: 0.1 %
Date Time Interrogation Session: 20250826012306
HighPow Impedance: 63 Ohm
Implantable Lead Connection Status: 753985
Implantable Lead Implant Date: 20221128
Implantable Lead Location: 753860
Implantable Lead Model: 6935
Implantable Pulse Generator Implant Date: 20221128
Lead Channel Impedance Value: 304 Ohm
Lead Channel Impedance Value: 418 Ohm
Lead Channel Pacing Threshold Amplitude: 0.625 V
Lead Channel Pacing Threshold Pulse Width: 0.4 ms
Lead Channel Sensing Intrinsic Amplitude: 16.375 mV
Lead Channel Sensing Intrinsic Amplitude: 16.375 mV
Lead Channel Setting Pacing Amplitude: 2 V
Lead Channel Setting Pacing Pulse Width: 0.4 ms
Lead Channel Setting Sensing Sensitivity: 0.3 mV
Zone Setting Status: 755011
Zone Setting Status: 755011

## 2023-11-21 LAB — CUP PACEART INCLINIC DEVICE CHECK
Date Time Interrogation Session: 20250827143157
Implantable Lead Connection Status: 753985
Implantable Lead Implant Date: 20221128
Implantable Lead Location: 753860
Implantable Lead Model: 6935
Implantable Pulse Generator Implant Date: 20221128

## 2023-11-21 NOTE — Patient Instructions (Signed)
 Medication Instructions:  No medications changes today   *If you need a refill on your cardiac medications before your next appointment, please call your pharmacy*  Lab Work: No lab work today If you have labs (blood work) drawn today and your tests are completely normal, you will receive your results only by: MyChart Message (if you have MyChart) OR A paper copy in the mail If you have any lab test that is abnormal or we need to change your treatment, we will call you to review the results.  Testing/Procedures: No testing/procedures were scheduled today  Follow-Up: At [facility], you and your health needs are our priority.  As part of our continuing mission to provide you with exceptional heart care, our providers are all part of one team.  This team includes your primary Cardiologist (physician) and Advanced Practice Providers or APPs (Physician Assistants and Nurse Practitioners) who all work together to provide you with the care you need, when you need it.  Your next appointment:   12 month(s)  Provider:   You may see Brian Birmingham, MD or one of the following Advanced Practice Providers on your designated Care Team:    Brian Barrack, NP    We recommend signing up for the patient portal called MyChart.  Sign up information is provided on this After Visit Summary.  MyChart is used to connect with patients for Virtual Visits (Telemedicine).  Patients are able to view lab/test results, encounter notes, upcoming appointments, etc.  Non-urgent messages can be sent to your provider as well.   To learn more about what you can do with MyChart, go to [email or web address].

## 2023-11-22 ENCOUNTER — Ambulatory Visit: Payer: Self-pay | Admitting: Internal Medicine

## 2023-12-07 ENCOUNTER — Encounter: Payer: Self-pay | Admitting: Cardiovascular Disease

## 2023-12-09 NOTE — Progress Notes (Signed)
 [facility]   MULTIDISCIPLINARY HEART VALVE CLINIC                                     Cardiology Office Note:    Date:  12/10/2023   ID:  Brian Robinson, DOB [birth year], MRN [id number]  PCP:  Rexanne Ingle, MD  [facility] Cardiologist:  Ozell Fell, MD  [facility] Structural heart: Ozell Fell, MD [facility] Electrophysiologist:  Danelle Birmingham, MD   Referring MD: Rexanne Ingle, MD   Follow up aortic stenosis  History of Present Illness:    Brian Robinson is a 78 y.o. male with a hx of HTN, HLD, DMT2, VF arrest s/p ICD (01/2021), CAD s/p CABG (08/2020), HFpEF, and severe AS who presents to clinic for follow up.  He initially presented with out of hospital cardiac arrest and was found to have severe multivessel CAD, ultimately treated with CABG x3V (LIMA--> LAD, SVD--> D1, DVG --> PDA). 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 He has done very well on GDMT. Echo 05/21/23 showed grossly normal ~50-55% (poor quality images) and mod-severe AS with Vmax 3.1 m/s, MG 21 mmHg, severe by AVA (0.9 cm^2) and DI (0.23). Low SV index (33 cc/m^2) felt to be c/w low flow low gradient severe AS.  He was seen by Dr. Fell 06/09/23 for consultation and plan was to continue surveillance of AS given asymptomatic status and stable LV function.   Today the patient presents to clinic for follow up. Here with wife. No CP or SOB. No LE edema, orthopnea or PND. No dizziness or syncope. No blood in stool or urine. No palpitations. Stays very active working out in the yard.     Past Medical History:  Diagnosis Date   COVID-19    received Mab 01/2020   Diabetes mellitus without complication (HCC)    Heart murmur    Hypertension    Mild aortic stenosis      Current Medications: Current Meds  Medication Sig   acetaminophen  (TYLENOL ) 500 MG tablet Take 2 tablets (1,000 mg total) by mouth every 6 (six) hours as needed.   ALPRAZolam  (XANAX) 0.25 MG tablet Take 0.25 mg by mouth daily as needed for anxiety.   aspirin  81 MG EC tablet Take 1 tablet (81 mg total) by mouth daily.   candesartan  (ATACAND ) 16 MG tablet Take 16 mg by mouth daily.   cetirizine (ZYRTEC) 10 MG tablet Take 10 mg by mouth as needed.   diphenhydrAMINE  (BENADRYL ) 25 mg capsule Take 25 mg by mouth every 6 (six) hours as needed for allergies or sleep.   ezetimibe  (ZETIA ) 10 MG tablet Take 1 tablet (10 mg total) by mouth daily.   ferrous sulfate 325 (65 FE) MG tablet Take 325 mg by mouth daily with breakfast.   JARDIANCE  10 MG TABS tablet TAKE 1 TABLET BY MOUTH DAILY BEFORE BREAKFAST.   metFORMIN (GLUCOPHAGE) 500 MG tablet Take 500 mg by mouth 2 (two) times daily with a meal.   Multiple Vitamin (MULTI VITAMIN DAILY PO) Take 1 tablet by mouth daily.   Multiple Vitamins-Minerals (PRESERVISION AREDS 2) CAPS Take 1 capsule by mouth 2 (two) times daily.   sertraline  (ZOLOFT ) 50 MG tablet Take 50 mg by mouth in the morning.   [DISCONTINUED] rosuvastatin  (CRESTOR ) 20 MG tablet TAKE 1 TABLET BY MOUTH EVERY DAY   [DISCONTINUED] spironolactone  (  ALDACTONE ) 25 MG tablet TAKE 1/2 TABLET BY MOUTH EVERY DAY      ROS:   Please see the history of present illness.    All other systems reviewed and are negative.  EKGs       Risk Assessment/Calculations:           Physical Exam:    VS:  BP (!) 136/58   Pulse (!) 57   Ht 5' 9 (1.753 m)   Wt 196 lb 6.4 oz (89.1 kg)   SpO2 99%   BMI 29.00 kg/m     Wt Readings from Last 3 Encounters:  12/10/23 196 lb 6.4 oz (89.1 kg)  11/21/23 197 lb 12.8 oz (89.7 kg)  06/01/23 196 lb 12.8 oz (89.3 kg)     GEN: Well nourished, well developed in no acute distress NECK: No JVD CARDIAC: RRR, 3/6 harsh SEM @ RUSB. No rubs, gallops RESPIRATORY:  Clear to auscultation without rales, wheezing or rhonchi  ABDOMEN: Soft, non-tender, non-distended EXTREMITIES:  No edema; No deformity.    ASSESSMENT:    1. Nonrheumatic  aortic valve stenosis   2. Heart failure with improved ejection fraction (HFimpEF) (HCC)   3. Primary hypertension   4. Hyperlipidemia, unspecified hyperlipidemia type   5. Coronary artery disease involving native coronary artery of native heart without angina pectoris   6. ICD (implantable cardioverter-defibrillator) in place     PLAN:    In order of problems listed above:  Nonrheumatic aortic valve stenosis -- Pt known to have moderately severe low flow low gradient aortic stenosis. Stage C1.  -- Echo 05/21/23 showed normal LV and RV function, trivial MR, and severe AS with mean gradient 21 mmHg, AVA 0.9 square cm, DI 0.23, and severely calcified restricted leaflet mobility.  -- He has had no change in clinical status and remains asymptomatic so will continue surveillance of AS with serial echos. -- Echo arranged for 05/26/24 and appt will be arranged with Dr. Wonda after echo when schedule opens.   HFimpEF -- Appears euvolemic.  -- Continue GDMT with candesartan  16mg  daily, jardiance  10mg  daily and spironolactone  12.5mg  daily.  -- BMET in 07/2023 reviewed and showed creat 1.04 and K 4.6.  Essential hypertension -- BP well controlled today.  -- Continue candesartan  16mg  daily and spironolactone  12.5mg  daily.   Mixed hyperlipidemia -- Continue rosuvastatin  20 mg daily and ezetimibe  10mg  daily.  -- LDL goal < 55 mg/dL  CAD s/p CABG: -- No chest pain. -- Continue medical therapy with aspirin  81mg  daily and  rosuvastatin  20 mg daily.   VF arrest: -- s/p ICD. -- Followed by Dr. Waddell.   Medication Adjustments/Labs and Tests Ordered: Current medicines are reviewed at length with the patient today.  Concerns regarding medicines are outlined above.  No orders of the defined types were placed in this encounter.  Meds ordered this encounter  Medications   rosuvastatin  (CRESTOR ) 20 MG tablet    Sig: Take 1 tablet (20 mg total) by mouth daily.    Dispense:  90 tablet    Refill:   3   spironolactone  (ALDACTONE ) 25 MG tablet    Sig: Take 0.5 tablets (12.5 mg total) by mouth daily.    Dispense:  45 tablet    Refill:  2    Patient Instructions  Medication Instructions:   Your provider recommends that you continue on your current medications as directed. Please refer to the Current Medication list given to you today.   Lab Work: none If you  have labs (blood work) drawn today and your tests are completely normal, you will receive your results only by: MyChart Message (if you have MyChart) OR A paper copy in the mail If you have any lab test that is abnormal or we need to change your treatment, we will call you to review the results.  Testing/Procedures: Echo in March  Follow-Up: At [facility], you and your health needs are our priority.  As part of our continuing mission to provide you with exceptional heart care, our providers are all part of one team.  This team includes your primary Cardiologist (physician) and Advanced Practice Providers or APPs (Physician Assistants and Nurse Practitioners) who all work together to provide you with the care you need, when you need it.  Your next appointment:    Please keep your follow up as scheduled.  We will get you back in with Dr. Wonda which his schedule opens up in March.    Signed, Lamarr Hummer, PA-C  12/10/2023 11:26 AM    [facility]

## 2023-12-10 ENCOUNTER — Ambulatory Visit: Attending: Physician Assistant | Admitting: Physician Assistant

## 2023-12-10 VITALS — BP 136/58 | HR 57 | Ht 69.0 in | Wt 196.4 lb

## 2023-12-10 DIAGNOSIS — E785 Hyperlipidemia, unspecified: Secondary | ICD-10-CM | POA: Insufficient documentation

## 2023-12-10 DIAGNOSIS — I2510 Atherosclerotic heart disease of native coronary artery without angina pectoris: Secondary | ICD-10-CM | POA: Insufficient documentation

## 2023-12-10 DIAGNOSIS — I10 Essential (primary) hypertension: Secondary | ICD-10-CM | POA: Insufficient documentation

## 2023-12-10 DIAGNOSIS — I350 Nonrheumatic aortic (valve) stenosis: Secondary | ICD-10-CM | POA: Diagnosis not present

## 2023-12-10 DIAGNOSIS — Z95810 Presence of automatic (implantable) cardiac defibrillator: Secondary | ICD-10-CM | POA: Insufficient documentation

## 2023-12-10 DIAGNOSIS — I5032 Chronic diastolic (congestive) heart failure: Secondary | ICD-10-CM | POA: Insufficient documentation

## 2023-12-10 MED ORDER — SPIRONOLACTONE 25 MG PO TABS: 12.5000 mg | ORAL_TABLET | Freq: Every day | ORAL | 2 refills | Status: AC

## 2023-12-10 MED ORDER — ROSUVASTATIN CALCIUM 20 MG PO TABS: 20.0000 mg | ORAL_TABLET | Freq: Every day | ORAL | 3 refills | Status: AC

## 2023-12-10 NOTE — Patient Instructions (Signed)
 Medication Instructions:   Your provider recommends that you continue on your current medications as directed. Please refer to the Current Medication list given to you today.   Lab Work: none If you have labs (blood work) drawn today and your tests are completely normal, you will receive your results only by: MyChart Message (if you have MyChart) OR A paper copy in the mail If you have any lab test that is abnormal or we need to change your treatment, we will call you to review the results.  Testing/Procedures: Echo in March  Follow-Up: At [facility], you and your health needs are our priority.  As part of our continuing mission to provide you with exceptional heart care, our providers are all part of one team.  This team includes your primary Cardiologist (physician) and Advanced Practice Providers or APPs (Physician Assistants and Nurse Practitioners) who all work together to provide you with the care you need, when you need it.  Your next appointment:    Please keep your follow up as scheduled.  We will get you back in with Dr. Wonda which his schedule opens up in March.

## 2023-12-12 NOTE — Progress Notes (Signed)
Remote ICD Transmission.

## 2024-01-17 DIAGNOSIS — Z23 Encounter for immunization: Secondary | ICD-10-CM | POA: Diagnosis not present

## 2024-02-11 DIAGNOSIS — I2510 Atherosclerotic heart disease of native coronary artery without angina pectoris: Secondary | ICD-10-CM | POA: Diagnosis not present

## 2024-02-11 DIAGNOSIS — I350 Nonrheumatic aortic (valve) stenosis: Secondary | ICD-10-CM | POA: Diagnosis not present

## 2024-02-11 DIAGNOSIS — F419 Anxiety disorder, unspecified: Secondary | ICD-10-CM | POA: Diagnosis not present

## 2024-02-11 DIAGNOSIS — E7800 Pure hypercholesterolemia, unspecified: Secondary | ICD-10-CM | POA: Diagnosis not present

## 2024-02-11 DIAGNOSIS — E1169 Type 2 diabetes mellitus with other specified complication: Secondary | ICD-10-CM | POA: Diagnosis not present

## 2024-02-11 DIAGNOSIS — Z95810 Presence of automatic (implantable) cardiac defibrillator: Secondary | ICD-10-CM | POA: Diagnosis not present

## 2024-02-11 DIAGNOSIS — I5040 Unspecified combined systolic (congestive) and diastolic (congestive) heart failure: Secondary | ICD-10-CM | POA: Diagnosis not present

## 2024-02-19 ENCOUNTER — Ambulatory Visit: Payer: Medicare Other

## 2024-02-19 DIAGNOSIS — I5020 Unspecified systolic (congestive) heart failure: Secondary | ICD-10-CM

## 2024-02-20 LAB — CUP PACEART REMOTE DEVICE CHECK
Battery Remaining Longevity: 115 mo
Battery Voltage: 3.01 V
Brady Statistic RV Percent Paced: 0.04 %
Date Time Interrogation Session: 20251125012303
HighPow Impedance: 67 Ohm
Implantable Lead Connection Status: 753985
Implantable Lead Implant Date: 20221128
Implantable Lead Location: 753860
Implantable Lead Model: 6935
Implantable Pulse Generator Implant Date: 20221128
Lead Channel Impedance Value: 342 Ohm
Lead Channel Impedance Value: 399 Ohm
Lead Channel Pacing Threshold Amplitude: 0.625 V
Lead Channel Pacing Threshold Pulse Width: 0.4 ms
Lead Channel Sensing Intrinsic Amplitude: 19 mV
Lead Channel Sensing Intrinsic Amplitude: 19 mV
Lead Channel Setting Pacing Amplitude: 2 V
Lead Channel Setting Pacing Pulse Width: 0.4 ms
Lead Channel Setting Sensing Sensitivity: 0.3 mV
Zone Setting Status: 755011
Zone Setting Status: 755011

## 2024-02-22 NOTE — Progress Notes (Signed)
 Remote ICD Transmission

## 2024-02-27 ENCOUNTER — Ambulatory Visit: Payer: Self-pay | Admitting: Internal Medicine

## 2024-03-21 ENCOUNTER — Telehealth: Payer: Self-pay | Admitting: *Deleted

## 2024-03-21 NOTE — Telephone Encounter (Signed)
 Patient's wife, Ginger (DPR), called related to a [employer] statement she received that indicated a claim from [employer] for medical equipment would not be paid. She inquired as to whether this was related to pt's implanted device. Explained that we do not bill under a third party name for device-related billing. She denies any awareness of any other medical equipment. Encouraged her to call [employer] to get more information as she is concerned this could be fraudulent. Ginger verbalized understanding and appreciation of assistance.

## 2024-05-20 ENCOUNTER — Ambulatory Visit: Payer: Medicare Other

## 2024-05-26 ENCOUNTER — Other Ambulatory Visit (HOSPITAL_COMMUNITY)

## 2024-05-30 ENCOUNTER — Ambulatory Visit: Admitting: Cardiovascular Disease

## 2024-08-19 ENCOUNTER — Ambulatory Visit: Payer: Medicare Other

## 2024-11-18 ENCOUNTER — Ambulatory Visit: Payer: Medicare Other
